# Patient Record
Sex: Male | Born: 1945 | Race: White | Hispanic: No | Smoking: Former smoker
Health system: Southern US, Community
[De-identification: ages and names within clinical notes are randomized; demographics above are authoritative.]

## PROBLEM LIST (undated history)

## (undated) DIAGNOSIS — D126 Benign neoplasm of colon, unspecified: Secondary | ICD-10-CM

## (undated) DIAGNOSIS — D6949 Other primary thrombocytopenia: Secondary | ICD-10-CM

## (undated) DIAGNOSIS — K219 Gastro-esophageal reflux disease without esophagitis: Secondary | ICD-10-CM

## (undated) DIAGNOSIS — K449 Diaphragmatic hernia without obstruction or gangrene: Secondary | ICD-10-CM

## (undated) DIAGNOSIS — K5790 Diverticulosis of intestine, part unspecified, without perforation or abscess without bleeding: Secondary | ICD-10-CM

## (undated) DIAGNOSIS — L409 Psoriasis, unspecified: Secondary | ICD-10-CM

## (undated) DIAGNOSIS — K429 Umbilical hernia without obstruction or gangrene: Secondary | ICD-10-CM

## (undated) DIAGNOSIS — M1990 Unspecified osteoarthritis, unspecified site: Secondary | ICD-10-CM

## (undated) DIAGNOSIS — I739 Peripheral vascular disease, unspecified: Secondary | ICD-10-CM

## (undated) DIAGNOSIS — N189 Chronic kidney disease, unspecified: Secondary | ICD-10-CM

## (undated) DIAGNOSIS — E119 Type 2 diabetes mellitus without complications: Secondary | ICD-10-CM

## (undated) DIAGNOSIS — Z8673 Personal history of transient ischemic attack (TIA), and cerebral infarction without residual deficits: Secondary | ICD-10-CM

## (undated) DIAGNOSIS — I471 Supraventricular tachycardia, unspecified: Secondary | ICD-10-CM

## (undated) DIAGNOSIS — R7303 Prediabetes: Secondary | ICD-10-CM

## (undated) DIAGNOSIS — Z87442 Personal history of urinary calculi: Secondary | ICD-10-CM

## (undated) DIAGNOSIS — Z8739 Personal history of other diseases of the musculoskeletal system and connective tissue: Secondary | ICD-10-CM

## (undated) DIAGNOSIS — I10 Essential (primary) hypertension: Secondary | ICD-10-CM

## (undated) DIAGNOSIS — I6523 Occlusion and stenosis of bilateral carotid arteries: Secondary | ICD-10-CM

## (undated) DIAGNOSIS — G459 Transient cerebral ischemic attack, unspecified: Secondary | ICD-10-CM

## (undated) DIAGNOSIS — E785 Hyperlipidemia, unspecified: Secondary | ICD-10-CM

## (undated) HISTORY — PX: UMBILICAL HERNIA REPAIR: SHX196

## (undated) HISTORY — PX: CHOLECYSTECTOMY: SHX55

## (undated) HISTORY — PX: TONSILLECTOMY: SUR1361

## (undated) HISTORY — PX: CATARACT EXTRACTION, BILATERAL: SHX1313

## (undated) HISTORY — PX: HERNIA REPAIR: SHX51

## (undated) HISTORY — PX: COLONOSCOPY: SHX174

---

## 2010-04-21 ENCOUNTER — Ambulatory Visit: Payer: Self-pay | Admitting: Gastroenterology

## 2014-07-16 DIAGNOSIS — I10 Essential (primary) hypertension: Secondary | ICD-10-CM | POA: Insufficient documentation

## 2014-07-16 DIAGNOSIS — Z8739 Personal history of other diseases of the musculoskeletal system and connective tissue: Secondary | ICD-10-CM | POA: Insufficient documentation

## 2014-11-25 DIAGNOSIS — I471 Supraventricular tachycardia, unspecified: Secondary | ICD-10-CM | POA: Insufficient documentation

## 2015-01-17 DIAGNOSIS — D6949 Other primary thrombocytopenia: Secondary | ICD-10-CM | POA: Insufficient documentation

## 2015-01-17 DIAGNOSIS — E1165 Type 2 diabetes mellitus with hyperglycemia: Secondary | ICD-10-CM | POA: Insufficient documentation

## 2015-01-17 DIAGNOSIS — R945 Abnormal results of liver function studies: Secondary | ICD-10-CM | POA: Insufficient documentation

## 2015-01-17 DIAGNOSIS — R7989 Other specified abnormal findings of blood chemistry: Secondary | ICD-10-CM | POA: Insufficient documentation

## 2015-01-17 DIAGNOSIS — IMO0002 Reserved for concepts with insufficient information to code with codable children: Secondary | ICD-10-CM | POA: Insufficient documentation

## 2016-06-15 ENCOUNTER — Encounter: Payer: Self-pay | Admitting: *Deleted

## 2016-06-18 ENCOUNTER — Ambulatory Visit
Admission: RE | Admit: 2016-06-18 | Discharge: 2016-06-18 | Disposition: A | Discharge status: Home / Self Care | Payer: Medicare Other | Source: Ambulatory Visit | Attending: Gastroenterology | Admitting: Gastroenterology

## 2016-06-18 ENCOUNTER — Ambulatory Visit: Payer: Medicare Other | Admitting: Anesthesiology

## 2016-06-18 ENCOUNTER — Encounter
Admission: RE | Disposition: A | Discharge status: Home / Self Care | Payer: Self-pay | Source: Ambulatory Visit | Attending: Gastroenterology

## 2016-06-18 DIAGNOSIS — K449 Diaphragmatic hernia without obstruction or gangrene: Secondary | ICD-10-CM | POA: Insufficient documentation

## 2016-06-18 DIAGNOSIS — D123 Benign neoplasm of transverse colon: Secondary | ICD-10-CM | POA: Diagnosis not present

## 2016-06-18 DIAGNOSIS — K2950 Unspecified chronic gastritis without bleeding: Secondary | ICD-10-CM | POA: Insufficient documentation

## 2016-06-18 DIAGNOSIS — Z79899 Other long term (current) drug therapy: Secondary | ICD-10-CM | POA: Diagnosis not present

## 2016-06-18 DIAGNOSIS — R195 Other fecal abnormalities: Secondary | ICD-10-CM | POA: Insufficient documentation

## 2016-06-18 DIAGNOSIS — K5730 Diverticulosis of large intestine without perforation or abscess without bleeding: Secondary | ICD-10-CM | POA: Diagnosis not present

## 2016-06-18 DIAGNOSIS — I10 Essential (primary) hypertension: Secondary | ICD-10-CM | POA: Insufficient documentation

## 2016-06-18 DIAGNOSIS — K210 Gastro-esophageal reflux disease with esophagitis: Secondary | ICD-10-CM | POA: Diagnosis not present

## 2016-06-18 DIAGNOSIS — K2980 Duodenitis without bleeding: Secondary | ICD-10-CM | POA: Insufficient documentation

## 2016-06-18 DIAGNOSIS — K259 Gastric ulcer, unspecified as acute or chronic, without hemorrhage or perforation: Secondary | ICD-10-CM | POA: Insufficient documentation

## 2016-06-18 DIAGNOSIS — M109 Gout, unspecified: Secondary | ICD-10-CM | POA: Diagnosis not present

## 2016-06-18 DIAGNOSIS — K2210 Ulcer of esophagus without bleeding: Secondary | ICD-10-CM | POA: Diagnosis not present

## 2016-06-18 HISTORY — DX: Diaphragmatic hernia without obstruction or gangrene: K44.9

## 2016-06-18 HISTORY — PX: COLONOSCOPY WITH PROPOFOL: SHX5780

## 2016-06-18 HISTORY — PX: ESOPHAGOGASTRODUODENOSCOPY (EGD) WITH PROPOFOL: SHX5813

## 2016-06-18 HISTORY — DX: Personal history of other diseases of the musculoskeletal system and connective tissue: Z87.39

## 2016-06-18 HISTORY — DX: Essential (primary) hypertension: I10

## 2016-06-18 LAB — CBC
HEMATOCRIT: 46.1 % (ref 40.0–52.0)
Hemoglobin: 15.8 g/dL (ref 13.0–18.0)
MCH: 35.4 pg — AB (ref 26.0–34.0)
MCHC: 34.2 g/dL (ref 32.0–36.0)
MCV: 103.5 fL — ABNORMAL HIGH (ref 80.0–100.0)
Platelets: 140 10*3/uL — ABNORMAL LOW (ref 150–440)
RBC: 4.46 MIL/uL (ref 4.40–5.90)
RDW: 13.9 % (ref 11.5–14.5)
WBC: 7 10*3/uL (ref 3.8–10.6)

## 2016-06-18 SURGERY — COLONOSCOPY WITH PROPOFOL
Anesthesia: General

## 2016-06-18 MED ORDER — SODIUM CHLORIDE 0.9 % IV SOLN
INTRAVENOUS | Status: DC
  Administered 2016-06-18: 1000 mL via INTRAVENOUS

## 2016-06-18 MED ORDER — PROPOFOL 500 MG/50ML IV EMUL
INTRAVENOUS | Status: DC | PRN
  Administered 2016-06-18: 150 ug/kg/min via INTRAVENOUS

## 2016-06-18 MED ORDER — MIDAZOLAM HCL 2 MG/2ML IJ SOLN
INTRAMUSCULAR | Status: DC | PRN
  Administered 2016-06-18: 2 mg via INTRAVENOUS

## 2016-06-18 MED ORDER — PHENYLEPHRINE HCL 10 MG/ML IJ SOLN
INTRAMUSCULAR | Status: DC | PRN
  Administered 2016-06-18 (×3): 100 ug via INTRAVENOUS

## 2016-06-18 MED ORDER — EPHEDRINE SULFATE 50 MG/ML IJ SOLN
INTRAMUSCULAR | Status: DC | PRN
  Administered 2016-06-18 (×2): 10 mg via INTRAVENOUS

## 2016-06-18 MED ORDER — SODIUM CHLORIDE 0.9 % IV SOLN
INTRAVENOUS | Status: DC
Start: 2016-06-18 — End: 2016-06-18

## 2016-06-18 MED ORDER — GLYCOPYRROLATE 0.2 MG/ML IJ SOLN
INTRAMUSCULAR | Status: DC | PRN
  Administered 2016-06-18: 0.2 mg via INTRAVENOUS

## 2016-06-18 MED ORDER — PROPOFOL 10 MG/ML IV BOLUS
INTRAVENOUS | Status: DC | PRN
  Administered 2016-06-18: 60 mg via INTRAVENOUS

## 2016-06-18 MED ORDER — LIDOCAINE HCL (CARDIAC) 20 MG/ML IV SOLN
INTRAVENOUS | Status: DC | PRN
  Administered 2016-06-18: 60 mg via INTRAVENOUS

## 2016-06-18 NOTE — Op Note (Signed)
[facility] Gastroenterology Patient Name: Victor Rojas Procedure Date: 06/18/2016 9:35 AM MRN: [id number] Account #: [id number] Date of Birth: 24-Dec-1945 Admit Type: Outpatient Age: 70 Room: [facility] ENDO ROOM 1 Gender: Male Note Status: Finalized Procedure:            Upper GI endoscopy Indications:          Heme positive stool Providers:            Lollie Sails, MD Referring MD:         Hewitt Blade. Sarina Ser, MD (Referring MD) Medicines:            Monitored Anesthesia Care Complications:        No immediate complications. Procedure:            Pre-Anesthesia Assessment:                       - ASA Grade Assessment: II - A patient with mild                        systemic disease.                       After obtaining informed consent, the endoscope was                        passed under direct vision. Throughout the procedure,                        the patient's blood pressure, pulse, and oxygen                        saturations were monitored continuously. The Endoscope                        was introduced through the mouth, and advanced to the                        third part of duodenum. The upper GI endoscopy was                        accomplished without difficulty. The patient tolerated                        the procedure well. Findings:      LA Grade B (one or more mucosal breaks greater than 5 mm, not extending       between the tops of two mucosal folds) esophagitis with no bleeding was       found.      A single 2 mm non-bleeding erosion was found in the mid esophagus.       Biopsies were taken with a cold forceps for histology.      The exam of the esophagus was otherwise normal.      Patchy mild inflammation characterized by adherent blood, congestion       (edema) and erosions was found in the gastric fundus, in the gastric       body and in the gastric antrum.      Four non-bleeding superficial gastric ulcers were found in the   prepyloric region of the stomach. Biopsies were taken with a cold       forceps  for histology. Biopsies were taken with a cold forceps for       Helicobacter pylori testing.      atypical antral/prepyloric foldPatchy moderate inflammation       characterized by erosions and shallow ulcerations was found in the       prepyloric region of the stomach. Biopsies were taken with a cold       forceps for histology.      The cardia and gastric fundus were normal on retroflexion otherwise.      Patchy mild inflammation characterized by congestion (edema) and       erythema was found in the duodenal bulb. Impression:           - LA Grade B erosive esophagitis.                       - A single non-bleeding erosion in the mid esophagus.                        Biopsied.                       - Erosive gastritis.                       - Non-bleeding gastric ulcers. Biopsied.                       - Erosive gastritis. Biopsied.                       - Duodenitis. Recommendation:       - Use Protonix (pantoprazole) 40 mg PO BID for 4 weeks.                       - Use Protonix (pantoprazole) 40 mg PO daily daily. Procedure Code(s):    --- Professional ---                       [phone], Esophagogastroduodenoscopy, flexible, transoral;                        with biopsy, single or multiple Diagnosis Code(s):    --- Professional ---                       K20.8, Other esophagitis                       K22.10, Ulcer of esophagus without bleeding                       K29.60, Other gastritis without bleeding                       K25.9, Gastric ulcer, unspecified as acute or chronic,                        without hemorrhage or perforation                       K29.80, Duodenitis without bleeding                       R19.5, Other fecal abnormalities CPT copyright 2016 American Medical Association.  All rights reserved. The codes documented in this report are preliminary and upon coder review may  be revised  to meet current compliance requirements. Lollie Sails, MD 06/18/2016 10:00:34 AM This report has been signed electronically. Number of Addenda: 0 Note Initiated On: 06/18/2016 9:35 AM      [facility]

## 2016-06-18 NOTE — H&P (Signed)
Outpatient short stay form Pre-procedure 06/18/2016 9:33 AM Lollie Sails MD  Primary Physician: Dr. Lisette Grinder  Reason for visit:  EGD and colonoscopy  History of present illness:  Patient is a 70 year old male presenting today as above. He had a screening Hemoccult cards done which was positive. He does have a history of some macrocytosis on his hemogram. Platelet count was rechecked today and found to be good at 140. He tolerated his prep well. He takes no recent aspirin products and he takes no blood thinning agents.    Current facility-administered medications:  .  0.9 %  sodium chloride infusion, , Intravenous, Continuous, Lollie Sails, MD, Last Rate: 20 mL/hr at 06/18/16 0905, 1,000 mL at 06/18/16 0905 .  0.9 %  sodium chloride infusion, , Intravenous, Continuous, Lollie Sails, MD  Prescriptions prior to admission  Medication Sig Dispense Refill Last Dose  . allopurinol (ZYLOPRIM) 300 MG tablet Take 300 mg by mouth daily.     . indomethacin (INDOCIN) 25 MG capsule Take 50 mg by mouth 3 (three) times daily with meals.     Marland Kitchen lisinopril-hydrochlorothiazide (PRINZIDE,ZESTORETIC) 20-12.5 MG tablet Take 1 tablet by mouth daily.   06/18/2016 at 0530  . metoprolol tartrate (LOPRESSOR) 25 MG tablet Take 25 mg by mouth 2 (two) times daily.   06/18/2016 at 0530     No Known Allergies   Past Medical History  Diagnosis Date  . Hiatal hernia   . History of gout   . Hypertension     Review of systems:      Physical Exam    Heart and lungs: Regular rate and rhythm without rub or gallop, lungs are bilaterally clear.    HEENT: Norm cephalic atraumatic eyes are anicteric    Other:     Pertinant exam for procedure: Soft nontender nondistended bowel sounds positive normoactive.    Planned proceedures: EGD, Colonoscopy and indicated procedures. I have discussed the risks benefits and complications of procedures to include not limited to bleeding, infection, perforation  and the risk of sedation and the patient wishes to proceed.    Lollie Sails, MD Gastroenterology 06/18/2016  9:33 AM

## 2016-06-18 NOTE — Anesthesia Preprocedure Evaluation (Signed)
Anesthesia Evaluation  Patient identified by MRN, date of birth, ID band Patient awake    Reviewed: Allergy & Precautions, H&P , NPO status , Patient's Chart, lab work & pertinent test results, reviewed documented beta blocker date and time   Airway Mallampati: II   Neck ROM: full    Dental  (+) Teeth Intact   Pulmonary neg pulmonary ROS, former smoker,    Pulmonary exam normal        Cardiovascular Exercise Tolerance: Good hypertension, On Medications negative cardio ROS Normal cardiovascular exam Rhythm:regular Rate:Normal     Neuro/Psych  Neuromuscular disease negative neurological ROS  negative psych ROS   GI/Hepatic negative GI ROS, Neg liver ROS, hiatal hernia,   Endo/Other  negative endocrine ROS  Renal/GU negative Renal ROS  negative genitourinary   Musculoskeletal   Abdominal   Peds  Hematology negative hematology ROS (+)   Anesthesia Other Findings Past Medical History:   Hiatal hernia                                                History of gout                                              Hypertension                                               Past Surgical History:   TONSILLECTOMY                                                 COLONOSCOPY                                                 BMI    Body Mass Index   29.96 kg/m 2     Reproductive/Obstetrics                             Anesthesia Physical Anesthesia Plan  ASA: II  Anesthesia Plan: General   Post-op Pain Management:    Induction:   Airway Management Planned:   Additional Equipment:   Intra-op Plan:   Post-operative Plan:   Informed Consent: I have reviewed the patients History and Physical, chart, labs and discussed the procedure including the risks, benefits and alternatives for the proposed anesthesia with the patient or authorized representative who has indicated his/her understanding and  acceptance.   Dental Advisory Given  Plan Discussed with: CRNA  Anesthesia Plan Comments:         Anesthesia Quick Evaluation

## 2016-06-18 NOTE — Op Note (Signed)
[facility] Gastroenterology Patient Name: Victor Rojas Procedure Date: 06/18/2016 9:34 AM MRN: [id number] Account #: [id number] Date of Birth: Mar 04, 1946 Admit Type: Outpatient Age: 70 Room: [facility] ENDO ROOM 1 Gender: Male Note Status: Finalized Procedure:            Colonoscopy Indications:          Heme positive stool Providers:            Lollie Sails, MD Referring MD:         Hewitt Blade. Sarina Ser, MD (Referring MD) Medicines:            Monitored Anesthesia Care Complications:        No immediate complications. Procedure:            Pre-Anesthesia Assessment:                       - ASA Grade Assessment: II - A patient with mild                        systemic disease.                       After obtaining informed consent, the colonoscope was                        passed under direct vision. Throughout the procedure,                        the patient's blood pressure, pulse, and oxygen                        saturations were monitored continuously. The Olympus                        PCF-H180AL colonoscope ( S#: [id number] ) was introduced                        through the anus and advanced to the the cecum,                        identified by appendiceal orifice and ileocecal valve.                        The colonoscopy was performed without difficulty. The                        patient tolerated the procedure well. The quality of                        the bowel preparation was good. Findings:      A 6 mm polyp was found in the splenic flexure. The polyp was sessile.       The polyp was removed with a cold snare. Resection and retrieval were       complete.      A few small-mouthed diverticula were found in the sigmoid colon and       distal descending colon.      The retroflexed view of the distal rectum and anal verge was normal and       showed no anal or rectal abnormalities.      The  digital rectal exam was normal. Pertinent negatives include  note       external skin tag. Impression:           - One 6 mm polyp at the splenic flexure, removed with a                        cold snare. Resected and retrieved.                       - Diverticulosis in the sigmoid colon and in the distal                        descending colon.                       - The distal rectum and anal verge are normal on                        retroflexion view. Recommendation:       - Discharge patient to home.                       - Return to GI clinic in 4 weeks. Procedure Code(s):    --- Professional ---                       [phone], Colonoscopy, flexible; with removal of tumor(s),                        polyp(s), or other lesion(s) by snare technique Diagnosis Code(s):    --- Professional ---                       D12.3, Benign neoplasm of transverse colon (hepatic                        flexure or splenic flexure)                       R19.5, Other fecal abnormalities                       K57.30, Diverticulosis of large intestine without                        perforation or abscess without bleeding CPT copyright 2016 American Medical Association. All rights reserved. The codes documented in this report are preliminary and upon coder review may  be revised to meet current compliance requirements. Lollie Sails, MD 06/18/2016 10:22:37 AM This report has been signed electronically. Number of Addenda: 0 Note Initiated On: 06/18/2016 9:34 AM Scope Withdrawal Time: 0 hours 11 minutes 5 seconds  Total Procedure Duration: 0 hours 18 minutes 8 seconds       [facility]

## 2016-06-18 NOTE — Transfer of Care (Signed)
Immediate Anesthesia Transfer of Care Note  Patient: Victor Rojas  Procedure(s) Performed: Procedure(s): COLONOSCOPY WITH PROPOFOL (N/A) ESOPHAGOGASTRODUODENOSCOPY (EGD) WITH PROPOFOL (N/A)  Patient Location: Endoscopy Unit  Anesthesia Type:General  Level of Consciousness: awake, alert , oriented and patient cooperative  Airway & Oxygen Therapy: Patient Spontanous Breathing and Patient connected to nasal cannula oxygen  Post-op Assessment: Report given to RN, Post -op Vital signs reviewed and stable and Patient moving all extremities X 4  Post vital signs: Reviewed and stable  Last Vitals:  Filed Vitals:   06/18/16 0755  BP: 135/67  Pulse: 62  Temp: 36.6 C  Resp: 18    Last Pain: There were no vitals filed for this visit.       Complications: No apparent anesthesia complications

## 2016-06-19 ENCOUNTER — Encounter: Payer: Self-pay | Admitting: Gastroenterology

## 2016-06-19 LAB — SURGICAL PATHOLOGY

## 2016-06-25 NOTE — Anesthesia Postprocedure Evaluation (Signed)
Anesthesia Post Note  Patient: Victor Rojas  Procedure(s) Performed: Procedure(s) (LRB): COLONOSCOPY WITH PROPOFOL (N/A) ESOPHAGOGASTRODUODENOSCOPY (EGD) WITH PROPOFOL (N/A)  Patient location during evaluation: PACU Anesthesia Type: General Level of consciousness: awake and alert Pain management: pain level controlled Vital Signs Assessment: post-procedure vital signs reviewed and stable Respiratory status: spontaneous breathing, nonlabored ventilation, respiratory function stable and patient connected to nasal cannula oxygen Cardiovascular status: blood pressure returned to baseline and stable Postop Assessment: no signs of nausea or vomiting Anesthetic complications: no    Last Vitals:  Filed Vitals:   06/18/16 1045 06/18/16 1055  BP: 99/64 117/74  Pulse: 75 79  Temp:    Resp: 15 16    Last Pain:  Filed Vitals:   06/19/16 0759  PainSc: 0-No pain                 Molli Barrows

## 2018-01-08 DIAGNOSIS — E782 Mixed hyperlipidemia: Secondary | ICD-10-CM | POA: Insufficient documentation

## 2018-04-15 DIAGNOSIS — Z8673 Personal history of transient ischemic attack (TIA), and cerebral infarction without residual deficits: Secondary | ICD-10-CM | POA: Insufficient documentation

## 2018-05-06 DIAGNOSIS — I6523 Occlusion and stenosis of bilateral carotid arteries: Secondary | ICD-10-CM | POA: Insufficient documentation

## 2018-05-21 ENCOUNTER — Ambulatory Visit (INDEPENDENT_AMBULATORY_CARE_PROVIDER_SITE_OTHER): Payer: Medicare Other | Admitting: Vascular Surgery

## 2018-05-21 ENCOUNTER — Encounter (INDEPENDENT_AMBULATORY_CARE_PROVIDER_SITE_OTHER): Payer: Self-pay | Admitting: Vascular Surgery

## 2018-05-21 VITALS — BP 156/81 | HR 54 | Resp 15 | Ht 68.5 in | Wt 204.0 lb

## 2018-05-21 DIAGNOSIS — I6523 Occlusion and stenosis of bilateral carotid arteries: Secondary | ICD-10-CM

## 2018-05-21 DIAGNOSIS — E785 Hyperlipidemia, unspecified: Secondary | ICD-10-CM

## 2018-05-21 DIAGNOSIS — I10 Essential (primary) hypertension: Secondary | ICD-10-CM | POA: Diagnosis not present

## 2018-05-21 NOTE — Progress Notes (Signed)
Subjective:    Patient ID: Victor Rojas, male    DOB: 1946-11-01, 72 y.o.   MRN: [id number] Chief Complaint  Patient presents with  . New Patient (Initial Visit)    Consult only, Carotid Stenosis   Presents as a new patient referred by Dr. Nehemiah Massed for evaluation of bilateral carotid artery stenosis.  Patient underwent a bilateral carotid ultrasound on Apr 29, 2018 and was found to have greater than 70% stenosis in the right internal carotid artery, less than 50% stenosis in the left internal carotid artery, with bilateral antegrade vertebral arteries. The patient denies experiencing Amaurosis Fugax, TIA like symptoms or focal motor deficits. The patient continues a regimen of ASA and dyslipidemia medication.  Patient denies any fever, nausea vomiting.  Review of Systems  Constitutional: Negative.   HENT: Negative.   Eyes: Negative.   Respiratory: Negative.   Cardiovascular: Negative.   Gastrointestinal: Negative.   Endocrine: Negative.   Genitourinary: Negative.   Musculoskeletal: Negative.   Skin: Negative.   Allergic/Immunologic: Negative.   Neurological: Negative.   Hematological: Negative.   Psychiatric/Behavioral: Negative.       Objective:   Physical Exam  Constitutional: He is oriented to person, place, and time. He appears well-developed and well-nourished. No distress.  HENT:  Head: Normocephalic and atraumatic.  Right Ear: External ear normal.  Left Ear: External ear normal.  Eyes: Pupils are equal, round, and reactive to light. Conjunctivae and EOM are normal.  Neck: Normal range of motion.  Right-sided bruit noted on exam.  No left-sided bruit noted.  Cardiovascular: Normal rate, regular rhythm, normal heart sounds and intact distal pulses.  Pulses:      Radial pulses are 2+ on the right side, and 2+ on the left side.  Pulmonary/Chest: Effort normal and breath sounds normal.  Musculoskeletal: Normal range of motion. He exhibits no edema.  Neurological: He  is alert and oriented to person, place, and time.  Skin: Skin is warm and dry. He is not diaphoretic.  Psychiatric: He has a normal mood and affect. His behavior is normal. Judgment and thought content normal.  Vitals reviewed.  BP (!) 156/81 (BP Location: Right Arm, Patient Position: Sitting)   Pulse (!) 54   Resp 15   Ht 5' 8.5" (1.74 m)   Wt 204 lb (92.5 kg)   BMI 30.57 kg/m   Past Medical History:  Diagnosis Date  . Hiatal hernia   . History of gout   . Hypertension    Social History   Socioeconomic History  . Marital status: Married    Spouse name: Not on file  . Number of children: Not on file  . Years of education: Not on file  . Highest education level: Not on file  Occupational History  . Not on file  Social Needs  . Financial resource strain: Not on file  . Food insecurity:    Worry: Not on file    Inability: Not on file  . Transportation needs:    Medical: Not on file    Non-medical: Not on file  Tobacco Use  . Smoking status: Former Research scientist (life sciences)  . Smokeless tobacco: Never Used  Substance and Sexual Activity  . Alcohol use: Yes    Alcohol/week: 3.6 oz    Types: 6 Cans of beer per week  . Drug use: No  . Sexual activity: Not on file  Lifestyle  . Physical activity:    Days per week: Not on file    Minutes per  session: Not on file  . Stress: Not on file  Relationships  . Social connections:    Talks on phone: Not on file    Gets together: Not on file    Attends religious service: Not on file    Active member of club or organization: Not on file    Attends meetings of clubs or organizations: Not on file    Relationship status: Not on file  . Intimate partner violence:    Fear of current or ex partner: Not on file    Emotionally abused: Not on file    Physically abused: Not on file    Forced sexual activity: Not on file  Other Topics Concern  . Not on file  Social History Narrative  . Not on file   Past Surgical History:  Procedure Laterality  Date  . COLONOSCOPY    . COLONOSCOPY WITH PROPOFOL N/A 06/18/2016   Procedure: COLONOSCOPY WITH PROPOFOL;  Surgeon: Lollie Sails, MD;  Location: [facility] ENDOSCOPY;  Service: Endoscopy;  Laterality: N/A;  . ESOPHAGOGASTRODUODENOSCOPY (EGD) WITH PROPOFOL N/A 06/18/2016   Procedure: ESOPHAGOGASTRODUODENOSCOPY (EGD) WITH PROPOFOL;  Surgeon: Lollie Sails, MD;  Location: [facility] ENDOSCOPY;  Service: Endoscopy;  Laterality: N/A;  . TONSILLECTOMY     History reviewed. No pertinent family history.  No Known Allergies     Assessment & Plan:  Presents as a new patient referred by Dr. Nehemiah Massed for evaluation of bilateral carotid artery stenosis.  Patient underwent a bilateral carotid ultrasound on Apr 29, 2018 and was found to have greater than 70% stenosis in the right internal carotid artery, less than 50% stenosis in the left internal carotid artery, with bilateral antegrade vertebral arteries. The patient denies experiencing Amaurosis Fugax, TIA like symptoms or focal motor deficits. The patient continues a regimen of ASA and dyslipidemia medication.  Patient denies any fever, nausea vomiting.  1. Bilateral carotid artery stenosis Studies reviewed with patient. Patient asymptomatic with stable duplex.   No intervention at this time.  Patient to return in six months for surveillance carotid duplex. Patient to continue medical optimization with ASA and dyslipidemia medication. Patient to remain abstinent of tobacco use. I have discussed with the patient at length the risk factors for and pathogenesis of atherosclerotic disease and encouraged a healthy diet, regular exercise regimen and blood pressure / glucose control.  Patient was instructed to contact our office in the interim with problems such as arm / leg weakness or numbness, speech / swallowing difficulty or temporary monocular blindness. The patient expresses their understanding.   - VAS US CAROTID; Future  2. Hyperlipidemia, unspecified  hyperlipidemia type - Stable On ASA and statin. Encouraged good control as its slows the progression of atherosclerotic disease  3. Essential hypertension - Stable Encouraged good control as its slows the progression of atherosclerotic disease  Current Outpatient Medications on File Prior to Visit  Medication Sig Dispense Refill  . allopurinol (ZYLOPRIM) 300 MG tablet Take 300 mg by mouth daily.    Marland Kitchen aspirin EC 81 MG tablet Take by mouth.    Marland Kitchen atorvastatin (LIPITOR) 20 MG tablet Take 20 mg by mouth daily.  11  . Blood Glucose Monitoring Suppl (ONE TOUCH ULTRA 2) w/Device KIT Use as instructed.    . indomethacin (INDOCIN) 25 MG capsule Take 50 mg by mouth 3 (three) times daily with meals.    Marland Kitchen lisinopril-hydrochlorothiazide (PRINZIDE,ZESTORETIC) 20-12.5 MG tablet Take 1 tablet by mouth daily.    . metoprolol tartrate (LOPRESSOR) 25 MG tablet  Take 25 mg by mouth 2 (two) times daily.    . Multiple Vitamin (MULTI-VITAMINS) TABS Take by mouth.     No current facility-administered medications on file prior to visit.    There are no Patient Instructions on file for this visit. Return in about 6 months (around 11/20/2018).  Denney Shein A Canio Winokur, PA-C

## 2018-08-08 DIAGNOSIS — L409 Psoriasis, unspecified: Secondary | ICD-10-CM | POA: Insufficient documentation

## 2018-08-08 DIAGNOSIS — K429 Umbilical hernia without obstruction or gangrene: Secondary | ICD-10-CM | POA: Insufficient documentation

## 2018-11-20 ENCOUNTER — Ambulatory Visit (INDEPENDENT_AMBULATORY_CARE_PROVIDER_SITE_OTHER): Payer: Medicare Other | Admitting: Nurse Practitioner

## 2018-11-20 ENCOUNTER — Ambulatory Visit (INDEPENDENT_AMBULATORY_CARE_PROVIDER_SITE_OTHER): Payer: Medicare Other

## 2018-11-20 ENCOUNTER — Encounter (INDEPENDENT_AMBULATORY_CARE_PROVIDER_SITE_OTHER): Payer: Self-pay | Admitting: Nurse Practitioner

## 2018-11-20 VITALS — BP 133/75 | HR 64 | Resp 16 | Ht 69.0 in | Wt 208.2 lb

## 2018-11-20 DIAGNOSIS — I10 Essential (primary) hypertension: Secondary | ICD-10-CM | POA: Diagnosis not present

## 2018-11-20 DIAGNOSIS — E785 Hyperlipidemia, unspecified: Secondary | ICD-10-CM

## 2018-11-20 DIAGNOSIS — I6523 Occlusion and stenosis of bilateral carotid arteries: Secondary | ICD-10-CM

## 2018-11-20 NOTE — Progress Notes (Signed)
Subjective:    Patient ID: Victor Rojas, male    DOB: [birth year], 72 y.o.   MRN: [id number] Chief Complaint  Patient presents with  . Follow-up    6 month    HPI  Victor Rojas is a 72 y.o. male The patient is seen for follow up evaluation of carotid stenosis. The carotid stenosis followed by ultrasound.   The patient denies amaurosis fugax. There is no recent history of TIA symptoms or focal motor deficits. There is no prior documented CVA.  The patient is taking enteric-coated aspirin 81 mg daily.  There is no history of migraine headaches. There is no history of seizures.  The patient has a history of coronary artery disease, no recent episodes of angina or shortness of breath. The patient denies PAD or claudication symptoms. There is a history of hyperlipidemia which is being treated with a statin.    Carotid Duplex done today shows 80-99% stenosis of right ICA and 1-39% stenosis of Left.  Last study is not available for review, but previous notes place the stenosis at >70%.    Past Medical History:  Diagnosis Date  . Hiatal hernia   . History of gout   . Hypertension     Past Surgical History:  Procedure Laterality Date  . COLONOSCOPY    . COLONOSCOPY WITH PROPOFOL N/A 06/18/2016   Procedure: COLONOSCOPY WITH PROPOFOL;  Surgeon: Lollie Sails, MD;  Location: [facility] ENDOSCOPY;  Service: Endoscopy;  Laterality: N/A;  . ESOPHAGOGASTRODUODENOSCOPY (EGD) WITH PROPOFOL N/A 06/18/2016   Procedure: ESOPHAGOGASTRODUODENOSCOPY (EGD) WITH PROPOFOL;  Surgeon: Lollie Sails, MD;  Location: [facility] ENDOSCOPY;  Service: Endoscopy;  Laterality: N/A;  . TONSILLECTOMY      Social History   Socioeconomic History  . Marital status: Married    Spouse name: Not on file  . Number of children: Not on file  . Years of education: Not on file  . Highest education level: Not on file  Occupational History  . Not on file  Social Needs  . Financial resource strain: Not on file    . Food insecurity:    Worry: Not on file    Inability: Not on file  . Transportation needs:    Medical: Not on file    Non-medical: Not on file  Tobacco Use  . Smoking status: Former Research scientist (life sciences)  . Smokeless tobacco: Never Used  Substance and Sexual Activity  . Alcohol use: Yes    Alcohol/week: 6.0 standard drinks    Types: 6 Cans of beer per week  . Drug use: No  . Sexual activity: Not on file  Lifestyle  . Physical activity:    Days per week: Not on file    Minutes per session: Not on file  . Stress: Not on file  Relationships  . Social connections:    Talks on phone: Not on file    Gets together: Not on file    Attends religious service: Not on file    Active member of club or organization: Not on file    Attends meetings of clubs or organizations: Not on file    Relationship status: Not on file  . Intimate partner violence:    Fear of current or ex partner: Not on file    Emotionally abused: Not on file    Physically abused: Not on file    Forced sexual activity: Not on file  Other Topics Concern  . Not on file  Social History Narrative  .  Not on file    History reviewed. No pertinent family history.  No Known Allergies   Review of Systems   Review of Systems: Negative Unless Checked Constitutional: '[]' Weight loss  '[]' Fever  '[]' Chills Cardiac: '[]' Chest pain   '[]'  Atrial Fibrillation  '[]' Palpitations   '[]' Shortness of breath when laying flat   '[]' Shortness of breath with exertion. Vascular:  '[]' Pain in legs with walking   '[]' Pain in legs with standing  '[]' History of DVT   '[]' Phlebitis   '[]' Swelling in legs   '[]' Varicose veins   '[]' Non-healing ulcers Pulmonary:   '[]' Uses home oxygen   '[]' Productive cough   '[]' Hemoptysis   '[]' Wheeze  '[]' COPD   '[]' Asthma Neurologic:  '[x]' Dizziness   '[]' Seizures   '[]' History of stroke   '[x]' History of TIA  '[]' Aphasia   '[x]' Vissual changes   '[]' Weakness or numbness in arm   '[]' Weakness or numbness in leg Musculoskeletal:   '[]' Joint swelling   '[]' Joint pain   '[]' Low  back pain  '[]'  History of Knee Replacement Hematologic:  '[]' Easy bruising  '[]' Easy bleeding   '[]' Hypercoagulable state   '[]' Anemic Gastrointestinal:  '[]' Diarrhea   '[]' Vomiting  '[]' Gastroesophageal reflux/heartburn   '[]' Difficulty swallowing. Genitourinary:  '[]' Chronic kidney disease   '[]' Difficult urination  '[]' Anuric   '[]' Blood in urine Skin:  '[]' Rashes   '[]' Ulcers  Psychological:  '[]' History of anxiety   '[]'  History of major depression  '[]'  Memory Difficulties     Objective:   Physical Exam  BP 133/75 (BP Location: Right Arm, Patient Position: Sitting)   Pulse 64   Resp 16   Ht '5\' 9"'  (1.753 m)   Wt 208 lb 3.2 oz (94.4 kg)   BMI 30.75 kg/m   Gen: WD/WN, NAD Head: [place]/AT, No temporalis wasting.  Ear/Nose/Throat: Hearing grossly intact, nares w/o erythema or drainage Eyes: PER, EOMI, sclera nonicteric.  Neck: Supple, no masses.  No JVD. Harsh R Bruit  Pulmonary:  Good air movement, no use of accessory muscles.  Cardiac: Regularly Irregular Rate  Vascular:  Vessel Right Left  Radial Palpable Palpable   Gastrointestinal: soft, non-distended. No guarding/no peritoneal signs.  Musculoskeletal: M/S 5/5 throughout.  No deformity or atrophy.  Neurologic: Pain and light touch intact in extremities.  Symmetrical.  Speech is fluent. Motor exam as listed above. Psychiatric: Judgment intact, Mood & affect appropriate for pt's clinical situation. Dermatologic: No Venous rashes. No Ulcers Noted.  No changes consistent with cellulitis. Lymph : No Cervical lymphadenopathy, no lichenification or skin changes of chronic lymphedema.      Assessment & Plan:   1. Bilateral carotid artery stenosis Carotid Duplex done today shows 80-99% stenosis of right ICA and 1-39% stenosis of Left.  Last study is not available for review, but previous notes place the stenosis at >70%.     The patient remains asymptomatic with respect to the carotid stenosis.  However, the patient has now progressed and has a lesion the is  >70%.  Patient should undergo CT angiography of the carotid arteries to define the degree of stenosis of the internal carotid arteries bilaterally and the anatomic suitability for surgery vs. intervention.  If the patient does indeed need surgery cardiac clearance will be required, once cleared the patient will be scheduled for surgery.  The risks, benefits and alternative therapies were reviewed in detail with the patient.  All questions were answered.  The patient agrees to proceed with imaging.  Continue antiplatelet therapy as prescribed. Continue management of CAD, HTN and Hyperlipidemia. Healthy heart diet, encouraged exercise at least 4  times per week.   - CT ANGIO NECK W OR WO CONTRAST; Future - CT ANGIO HEAD W OR WO CONTRAST; Future  2. Essential hypertension Continue antihypertensive medications as already ordered, these medications have been reviewed and there are no changes at this time.   3. Hyperlipidemia, unspecified hyperlipidemia type Continue statin as ordered and reviewed, no changes at this time    Current Outpatient Medications on File Prior to Visit  Medication Sig Dispense Refill  . allopurinol (ZYLOPRIM) 300 MG tablet Take 300 mg by mouth daily.    Marland Kitchen aspirin EC 81 MG tablet Take by mouth.    Marland Kitchen atorvastatin (LIPITOR) 20 MG tablet Take 20 mg by mouth daily.  11  . Blood Glucose Monitoring Suppl (ONE TOUCH ULTRA 2) w/Device KIT Use as instructed.    Marland Kitchen lisinopril-hydrochlorothiazide (PRINZIDE,ZESTORETIC) 20-12.5 MG tablet Take 1 tablet by mouth daily.    . metoprolol tartrate (LOPRESSOR) 25 MG tablet Take 25 mg by mouth 2 (two) times daily.    . Multiple Vitamin (MULTI-VITAMINS) TABS Take by mouth.     No current facility-administered medications on file prior to visit.     There are no Patient Instructions on file for this visit. No follow-ups on file.   Kris Hartmann, NP  This note was completed with Sales executive.  Any errors are purely  unintentional.

## 2018-11-25 ENCOUNTER — Ambulatory Visit
Admission: RE | Admit: 2018-11-25 | Discharge: 2018-11-25 | Disposition: A | Discharge status: Home / Self Care | Payer: Medicare Other | Source: Ambulatory Visit | Attending: Nurse Practitioner | Admitting: Nurse Practitioner

## 2018-11-25 DIAGNOSIS — I6523 Occlusion and stenosis of bilateral carotid arteries: Secondary | ICD-10-CM | POA: Diagnosis not present

## 2018-11-25 LAB — POCT I-STAT CREATININE: CREATININE: 1.1 mg/dL (ref 0.61–1.24)

## 2018-11-25 MED ORDER — IOPAMIDOL (ISOVUE-370) INJECTION 76%
75.0000 mL | Freq: Once | INTRAVENOUS | Status: AC | PRN
  Administered 2018-11-25: 75 mL via INTRAVENOUS

## 2018-11-27 ENCOUNTER — Ambulatory Visit (INDEPENDENT_AMBULATORY_CARE_PROVIDER_SITE_OTHER): Payer: Medicare Other | Admitting: Vascular Surgery

## 2018-11-27 ENCOUNTER — Encounter (INDEPENDENT_AMBULATORY_CARE_PROVIDER_SITE_OTHER): Payer: Self-pay | Admitting: Vascular Surgery

## 2018-11-27 VITALS — BP 154/74 | HR 66 | Resp 14 | Ht 69.0 in | Wt 211.2 lb

## 2018-11-27 DIAGNOSIS — Z7982 Long term (current) use of aspirin: Secondary | ICD-10-CM | POA: Diagnosis not present

## 2018-11-27 DIAGNOSIS — I10 Essential (primary) hypertension: Secondary | ICD-10-CM

## 2018-11-27 DIAGNOSIS — I6523 Occlusion and stenosis of bilateral carotid arteries: Secondary | ICD-10-CM | POA: Diagnosis not present

## 2018-11-27 DIAGNOSIS — E782 Mixed hyperlipidemia: Secondary | ICD-10-CM

## 2018-11-27 DIAGNOSIS — Z87891 Personal history of nicotine dependence: Secondary | ICD-10-CM

## 2018-11-27 NOTE — Progress Notes (Signed)
MRN : [id number]  Victor Rojas is a 72 y.o. (25-Dec-1945) male who presents with chief complaint of  Chief Complaint  Patient presents with  . Follow-up    CT RESULTS  .  History of Present Illness:   The patient is seen for follow up evaluation of carotid stenosis status post CT angiogram. CT scan was done 11/25/2018. Patient reports that the test went well with no problems or complications.   The patient denies interval amaurosis fugax. There is no recent or interval TIA symptoms or focal motor deficits. There is no prior documented CVA.  The patient is taking enteric-coated aspirin 81 mg daily.  There is no history of migraine headaches. There is no history of seizures.  The patient has a history of coronary artery disease, no recent episodes of angina or shortness of breath. The patient denies PAD or claudication symptoms. There is a history of hyperlipidemia which is being treated with a statin.    CT angiogram is reviewed by me personally and shows >95% RICA stenosis consistent with calcified plaque at the origin of the right internal carotid artery.   LICA <40%.  Current Meds  Medication Sig  . allopurinol (ZYLOPRIM) 300 MG tablet Take 300 mg by mouth daily.  Marland Kitchen aspirin EC 81 MG tablet Take by mouth.  Marland Kitchen atorvastatin (LIPITOR) 20 MG tablet Take 20 mg by mouth daily.  . Blood Glucose Monitoring Suppl (ONE TOUCH ULTRA 2) w/Device KIT Use as instructed.  Marland Kitchen lisinopril-hydrochlorothiazide (PRINZIDE,ZESTORETIC) 20-12.5 MG tablet Take 1 tablet by mouth daily.  . metoprolol tartrate (LOPRESSOR) 25 MG tablet Take 25 mg by mouth 2 (two) times daily.  . Multiple Vitamin (MULTI-VITAMINS) TABS Take by mouth.    Past Medical History:  Diagnosis Date  . Hiatal hernia   . History of gout   . Hypertension     Past Surgical History:  Procedure Laterality Date  . COLONOSCOPY    . COLONOSCOPY WITH PROPOFOL N/A 06/18/2016   Procedure: COLONOSCOPY WITH PROPOFOL;  Surgeon: Lollie Sails, MD;  Location: [facility] ENDOSCOPY;  Service: Endoscopy;  Laterality: N/A;  . ESOPHAGOGASTRODUODENOSCOPY (EGD) WITH PROPOFOL N/A 06/18/2016   Procedure: ESOPHAGOGASTRODUODENOSCOPY (EGD) WITH PROPOFOL;  Surgeon: Lollie Sails, MD;  Location: [facility] ENDOSCOPY;  Service: Endoscopy;  Laterality: N/A;  . TONSILLECTOMY      Social History Social History   Tobacco Use  . Smoking status: Former Research scientist (life sciences)  . Smokeless tobacco: Never Used  Substance Use Topics  . Alcohol use: Yes    Alcohol/week: 6.0 standard drinks    Types: 6 Cans of beer per week  . Drug use: No    Family History No family history on file.  No Known Allergies   REVIEW OF SYSTEMS (Negative unless checked)  Constitutional: '[]' Weight loss  '[]' Fever  '[]' Chills Cardiac: '[]' Chest pain   '[]' Chest pressure   '[]' Palpitations   '[]' Shortness of breath when laying flat   '[]' Shortness of breath with exertion. Vascular:  '[]' Pain in legs with walking   '[]' Pain in legs at rest  '[]' History of DVT   '[]' Phlebitis   '[]' Swelling in legs   '[]' Varicose veins   '[]' Non-healing ulcers Pulmonary:   '[]' Uses home oxygen   '[]' Productive cough   '[]' Hemoptysis   '[]' Wheeze  '[]' COPD   '[]' Asthma Neurologic:  '[]' Dizziness   '[]' Seizures   '[]' History of stroke   '[]' History of TIA  '[]' Aphasia   '[x]' Vissual changes   '[]' Weakness or numbness in arm   '[]' Weakness or numbness in leg Musculoskeletal:   '[]'   Joint swelling   '[]' Joint pain   '[]' Low back pain Hematologic:  '[]' Easy bruising  '[]' Easy bleeding   '[]' Hypercoagulable state   '[]' Anemic Gastrointestinal:  '[]' Diarrhea   '[]' Vomiting  '[]' Gastroesophageal reflux/heartburn   '[]' Difficulty swallowing. Genitourinary:  '[]' Chronic kidney disease   '[]' Difficult urination  '[]' Frequent urination   '[]' Blood in urine Skin:  '[]' Rashes   '[]' Ulcers  Psychological:  '[]' History of anxiety   '[]'  History of major depression.  Physical Examination  Vitals:   11/27/18 1600  BP: (!) 154/74  Pulse: 66  Resp: 14  Weight: 211 lb 3.2 oz (95.8 kg)  Height: '5\' 9"'  (1.753 m)    Body mass index is 31.19 kg/m. Gen: WD/WN, NAD Head: [place]/AT, No temporalis wasting.  Ear/Nose/Throat: Hearing grossly intact, nares w/o erythema or drainage Eyes: PER, EOMI, sclera nonicteric.  Neck: Supple, no large masses.   Pulmonary:  Good air movement, no audible wheezing bilaterally, no use of accessory muscles.  Cardiac: RRR, no JVD Vascular: right carotid bruit Vessel Right Left  Radial Palpable Palpable  Brachial Palpable Palpable  Carotid Palpable Palpable  Gastrointestinal: Non-distended. No guarding/no peritoneal signs.  Musculoskeletal: M/S 5/5 throughout.  No deformity or atrophy.  Neurologic: CN 2-12 intact. Symmetrical.  Speech is fluent. Motor exam as listed above. Psychiatric: Judgment intact, Mood & affect appropriate for pt's clinical situation. Dermatologic: No rashes or ulcers noted.  No changes consistent with cellulitis. Lymph : No lichenification or skin changes of chronic lymphedema.  CBC Lab Results  Component Value Date   WBC 7.0 06/18/2016   HGB 15.8 06/18/2016   HCT 46.1 06/18/2016   MCV 103.5 (H) 06/18/2016   PLT 140 (L) 06/18/2016    BMET    Component Value Date/Time   CREATININE 1.10 11/25/2018 1303   Estimated Creatinine Clearance: 69.3 mL/min (by C-G formula based on SCr of 1.1 mg/dL).  COAG No results found for: INR, PROTIME  Radiology Ct Angio Head W Or Wo Contrast  Addendum Date: 11/25/2018   ADDENDUM REPORT: 11/25/2018 14:30 ADDENDUM: Study discussed by telephone with Dr. Eulogio Ditch on 11/25/2018 at 1416 hours. Electronically Signed   By: Genevie Ann M.D.   On: 11/25/2018 14:30   Result Date: 11/25/2018 CLINICAL DATA:  72 year old male with high-grade right carotid stenosis by ultrasound earlier this month. EXAM: CT ANGIOGRAPHY HEAD AND NECK TECHNIQUE: Multidetector CT imaging of the head and neck was performed using the standard protocol during bolus administration of intravenous contrast. Multiplanar CT image reconstructions  and MIPs were obtained to evaluate the vascular anatomy. Carotid stenosis measurements (when applicable) are obtained utilizing NASCET criteria, using the distal internal carotid diameter as the denominator. CONTRAST:  38m ISOVUE-370 IOPAMIDOL (ISOVUE-370) INJECTION 76% COMPARISON:  None. FINDINGS: CT HEAD Brain: Cerebral volume is within normal limits for age. No midline shift, ventriculomegaly, mass effect, evidence of mass lesion, intracranial hemorrhage or evidence of cortically based acute infarction. Gray-white matter differentiation is within normal limits throughout the brain. No cortical encephalomalacia identified. Calvarium and skull base: Negative. Paranasal sinuses: Visible paranasal sinuses and mastoids are well pneumatized. Orbits: Negative orbits soft tissues. Visualized scalp soft tissues are within normal limits. CTA NECK Skeleton: Largely absent dentition. Degenerative disc and endplate changes in the lower cervical spine. No acute osseous abnormality identified. Upper chest: Paraseptal emphysema. No superior mediastinal lymphadenopathy. Other neck: Negative; no neck mass or lymphadenopathy. Aortic arch: 3 vessel arch configuration. Extensive Calcified aortic atherosclerosis. Right carotid system: No brachiocephalic artery or right CCA origin stenosis despite plaque. Circumferential soft plaque in  the right CCA. At the bifurcation bulky soft and calcified plaque results in radiographic string sign stenosis over a short 3-4 millimeters segment (series 20, image 139). Bulky plaque continues to the distal bulb. The left ECA origin is also affected with high-grade stenosis. The right ICA remains patent but has a mildly diminished caliber. Left carotid system: No left CCA origin stenosis despite plaque. Soft and calcified plaque in the medial left CCA proximal to the bifurcation. Calcified plaque at the left ICA origin and bulb with no stenosis. Vertebral arteries: No proximal right subclavian artery  stenosis despite plaque. The right vertebral artery is occluded with bulky calcified plaque from its origin through most of the V1 segment (series 18, image 124). There is additional right V2 segment calcified plaque. The right vertebral is reconstituted from muscular branches at C3 and patent to the skull base. Less than 50 % stenosis with respect to the distal vessel of the proximal left subclavian artery due to soft and calcified plaque. Bulky calcified plaque at the left vertebral artery origin resulting in String sign stenosis. The left V1 segment is patent but highly diminutive. The vessel has a more normal caliber beginning at C3-C4 where muscular branches are noted. The left vertebral remains patent to the skull base. CTA HEAD Posterior circulation: Heavily calcified right Vertebral artery V4 segment with stenosis and asymmetrically decreased enhancement. The vessel does remain patent to the vertebrobasilar junction. Comparatively mild calcified plaque in the left V4 segment with mild stenosis. Normal left PICA origin. Patent vertebrobasilar junction appearing mostly supplied from the left. Irregular but patent basilar artery without stenosis. Patent SCA origins. Normal left PCA origin. Fetal type right PCA origin. Left posterior communicating artery is diminutive. Bilateral PCA branches are within normal limits. Anterior circulation:  Both ICA siphons are patent. On the right there is heavy siphon calcification with severe cavernous stenosis (series 17, image 233 and series 18, image 127), and up to moderate superimposed supraclinoid segment stenosis. Normal right posterior communicating artery origin. On the left there is moderate cavernous and supraclinoid segment stenosis. There is a small infundibulum suspected corresponding to a diminutive left posterior communicating artery (series 17, image 239 and series 20, image 174). Both carotid termini are patent. The left ACA A1 segment is dominant, the right  is diminutive or absent. The anterior communicating artery is normal. There are prominent bilateral frontal polar branches. The bilateral ACA branches are within normal limits. Left MCA M1 segment and bifurcation are within normal limits. Left MCA branches are within normal limits. Right MCA M1 segment is mildly irregular without significant stenosis. Right MCA trifurcation and right MCA branches are within normal limits. Venous sinuses: Patent on the delayed images. Anatomic variants: Fetal type right PCA origin. Dominant left and diminutive or absent right ACA A1 segments. Delayed phase: No abnormal enhancement identified. Review of the MIP images confirms the above findings IMPRESSION: 1. Radiographic-String-Sign Stenosis at the Right ICA origin due to bulky soft and calcified plaque. Right ICA remains patent but has a mildly diminished caliber. Associated high-grade right ECA origin stenosis. Downstream superimposed Severe Right ICA cavernous segment stenosis due to bulky calcified plaque. 2. Positive also for occluded Right Vertebral Artery origin. The vessel is reconstituted at the distal V2 segment, but heavily calcified and stenotic again in the right V4 segment. 3. Positive also for severe Left Vertebral Artery origin stenosis due to bulky calcified plaque. Improved left vertebral artery flow also in the distal V2 segment due to muscular branches. 4.  Left carotid bifurcation plaque without stenosis. Up to moderate left ICA siphon stenosis due to bulky calcified plaque. 5. Otherwise negative intracranial anterior and posterior circulation. 6. Normal for age CT appearance of the brain. 7. Aortic Atherosclerosis (ICD10-I70.0) and Emphysema (ICD10-J43.9). Electronically Signed: By: Genevie Ann M.D. On: 11/25/2018 14:04   Ct Angio Neck W Or Wo Contrast  Addendum Date: 11/25/2018   ADDENDUM REPORT: 11/25/2018 14:30 ADDENDUM: Study discussed by telephone with Dr. Eulogio Ditch on 11/25/2018 at 1416 hours.  Electronically Signed   By: Genevie Ann M.D.   On: 11/25/2018 14:30   Result Date: 11/25/2018 CLINICAL DATA:  72 year old male with high-grade right carotid stenosis by ultrasound earlier this month. EXAM: CT ANGIOGRAPHY HEAD AND NECK TECHNIQUE: Multidetector CT imaging of the head and neck was performed using the standard protocol during bolus administration of intravenous contrast. Multiplanar CT image reconstructions and MIPs were obtained to evaluate the vascular anatomy. Carotid stenosis measurements (when applicable) are obtained utilizing NASCET criteria, using the distal internal carotid diameter as the denominator. CONTRAST:  44m ISOVUE-370 IOPAMIDOL (ISOVUE-370) INJECTION 76% COMPARISON:  None. FINDINGS: CT HEAD Brain: Cerebral volume is within normal limits for age. No midline shift, ventriculomegaly, mass effect, evidence of mass lesion, intracranial hemorrhage or evidence of cortically based acute infarction. Gray-white matter differentiation is within normal limits throughout the brain. No cortical encephalomalacia identified. Calvarium and skull base: Negative. Paranasal sinuses: Visible paranasal sinuses and mastoids are well pneumatized. Orbits: Negative orbits soft tissues. Visualized scalp soft tissues are within normal limits. CTA NECK Skeleton: Largely absent dentition. Degenerative disc and endplate changes in the lower cervical spine. No acute osseous abnormality identified. Upper chest: Paraseptal emphysema. No superior mediastinal lymphadenopathy. Other neck: Negative; no neck mass or lymphadenopathy. Aortic arch: 3 vessel arch configuration. Extensive Calcified aortic atherosclerosis. Right carotid system: No brachiocephalic artery or right CCA origin stenosis despite plaque. Circumferential soft plaque in the right CCA. At the bifurcation bulky soft and calcified plaque results in radiographic string sign stenosis over a short 3-4 millimeters segment (series 20, image 139). Bulky plaque  continues to the distal bulb. The left ECA origin is also affected with high-grade stenosis. The right ICA remains patent but has a mildly diminished caliber. Left carotid system: No left CCA origin stenosis despite plaque. Soft and calcified plaque in the medial left CCA proximal to the bifurcation. Calcified plaque at the left ICA origin and bulb with no stenosis. Vertebral arteries: No proximal right subclavian artery stenosis despite plaque. The right vertebral artery is occluded with bulky calcified plaque from its origin through most of the V1 segment (series 18, image 124). There is additional right V2 segment calcified plaque. The right vertebral is reconstituted from muscular branches at C3 and patent to the skull base. Less than 50 % stenosis with respect to the distal vessel of the proximal left subclavian artery due to soft and calcified plaque. Bulky calcified plaque at the left vertebral artery origin resulting in String sign stenosis. The left V1 segment is patent but highly diminutive. The vessel has a more normal caliber beginning at C3-C4 where muscular branches are noted. The left vertebral remains patent to the skull base. CTA HEAD Posterior circulation: Heavily calcified right Vertebral artery V4 segment with stenosis and asymmetrically decreased enhancement. The vessel does remain patent to the vertebrobasilar junction. Comparatively mild calcified plaque in the left V4 segment with mild stenosis. Normal left PICA origin. Patent vertebrobasilar junction appearing mostly supplied from the left. Irregular but patent basilar  artery without stenosis. Patent SCA origins. Normal left PCA origin. Fetal type right PCA origin. Left posterior communicating artery is diminutive. Bilateral PCA branches are within normal limits. Anterior circulation:  Both ICA siphons are patent. On the right there is heavy siphon calcification with severe cavernous stenosis (series 17, image 233 and series 18, image 127),  and up to moderate superimposed supraclinoid segment stenosis. Normal right posterior communicating artery origin. On the left there is moderate cavernous and supraclinoid segment stenosis. There is a small infundibulum suspected corresponding to a diminutive left posterior communicating artery (series 17, image 239 and series 20, image 174). Both carotid termini are patent. The left ACA A1 segment is dominant, the right is diminutive or absent. The anterior communicating artery is normal. There are prominent bilateral frontal polar branches. The bilateral ACA branches are within normal limits. Left MCA M1 segment and bifurcation are within normal limits. Left MCA branches are within normal limits. Right MCA M1 segment is mildly irregular without significant stenosis. Right MCA trifurcation and right MCA branches are within normal limits. Venous sinuses: Patent on the delayed images. Anatomic variants: Fetal type right PCA origin. Dominant left and diminutive or absent right ACA A1 segments. Delayed phase: No abnormal enhancement identified. Review of the MIP images confirms the above findings IMPRESSION: 1. Radiographic-String-Sign Stenosis at the Right ICA origin due to bulky soft and calcified plaque. Right ICA remains patent but has a mildly diminished caliber. Associated high-grade right ECA origin stenosis. Downstream superimposed Severe Right ICA cavernous segment stenosis due to bulky calcified plaque. 2. Positive also for occluded Right Vertebral Artery origin. The vessel is reconstituted at the distal V2 segment, but heavily calcified and stenotic again in the right V4 segment. 3. Positive also for severe Left Vertebral Artery origin stenosis due to bulky calcified plaque. Improved left vertebral artery flow also in the distal V2 segment due to muscular branches. 4. Left carotid bifurcation plaque without stenosis. Up to moderate left ICA siphon stenosis due to bulky calcified plaque. 5. Otherwise negative  intracranial anterior and posterior circulation. 6. Normal for age CT appearance of the brain. 7. Aortic Atherosclerosis (ICD10-I70.0) and Emphysema (ICD10-J43.9). Electronically Signed: By: Genevie Ann M.D. On: 11/25/2018 14:04   Vas US Carotid  Result Date: 11/21/2018 Carotid Arterial Duplex Study Indications: Carotid artery disease. Performing Technologist: Almira Coaster RVS  Examination Guidelines: A complete evaluation includes B-mode imaging, spectral Doppler, color Doppler, and power Doppler as needed of all accessible portions of each vessel. Bilateral testing is considered an integral part of a complete examination. Limited examinations for reoccurring indications may be performed as noted.  Right Carotid Findings: +----------+-------+-------+--------+---------------------------------+--------+           PSV    EDV    StenosisDescribe                         Comments           cm/s   cm/s                                                     +----------+-------+-------+--------+---------------------------------+--------+ CCA Prox  40     10                                                       +----------+-------+-------+--------+---------------------------------+--------+  CCA Mid   29     10                                                       +----------+-------+-------+--------+---------------------------------+--------+ CCA Distal33     13                                                       +----------+-------+-------+--------+---------------------------------+--------+ ICA Prox  497    132    80-99%  heterogenous, irregular and                                               calcific                                  +----------+-------+-------+--------+---------------------------------+--------+ ICA Mid   467    83                                                        +----------+-------+-------+--------+---------------------------------+--------+ ICA Distal138    29                                                       +----------+-------+-------+--------+---------------------------------+--------+ ECA       242    35                                                       +----------+-------+-------+--------+---------------------------------+--------+ +----------+--------+-------+--------+-------------------+           PSV cm/sEDV cmsDescribeArm Pressure (mmHG) +----------+--------+-------+--------+-------------------+ [id number]      0                                  +----------+--------+-------+--------+-------------------+ +---------+--------+--+--------+-+ VertebralPSV cm/s24EDV cm/s7 +---------+--------+--+--------+-+  Left Carotid Findings: +----------+--------+--------+--------+--------+--------+           PSV cm/sEDV cm/sStenosisDescribeComments +----------+--------+--------+--------+--------+--------+ CCA Prox  136     33                               +----------+--------+--------+--------+--------+--------+ CCA Mid   121     29                               +----------+--------+--------+--------+--------+--------+ CCA Distal61      14                               +----------+--------+--------+--------+--------+--------+  ICA Prox  77      18      1-39%                    +----------+--------+--------+--------+--------+--------+ ICA Mid   64      21                               +----------+--------+--------+--------+--------+--------+ ICA Distal72      27                               +----------+--------+--------+--------+--------+--------+ ECA       97      17                               +----------+--------+--------+--------+--------+--------+ +----------+--------+--------+--------+-------------------+ SubclavianPSV cm/sEDV cm/sDescribeArm Pressure (mmHG)  +----------+--------+--------+--------+-------------------+           181     0                                   +----------+--------+--------+--------+-------------------+ +---------+--------+---+--------+--+ VertebralPSV cm/s132EDV cm/s40 +---------+--------+---+--------+--+  Summary: Right Carotid: Velocities in the right ICA are consistent with a 80-99%                stenosis. The ECA appears >50% stenosed. Left Carotid: Velocities in the left ICA are consistent with a 1-39% stenosis. Vertebrals:  Bilateral vertebral arteries demonstrate antegrade flow. Subclavians: Normal flow hemodynamics were seen in bilateral subclavian              arteries. *See table(s) above for measurements and observations.  Electronically signed by Leotis Pain MD on 11/21/2018 at 1:34:46 PM.    Final      Assessment/Plan 1. Bilateral carotid artery stenosis Recommend:  The patient remains asymptomatic with respect to the carotid stenosis.  However, the patient has now progressed and has a lesion the is >95%.  Patient's CT angiography of the carotid arteries confirms >95% right ICA stenosis.  The anatomical considerations support surgery over stenting.  This was discussed in detail with the patient.  The patient does indeed need surgery, therefore, cardiac clearance will be arranged. Once cleared the patient will be scheduled for surgery.  The risks, benefits and alternative therapies were reviewed in detail with the patient.  All questions were answered.  The patient agrees to proceed with surgery of the right carotid artery.  Continue antiplatelet therapy as prescribed. Continue management of CAD, HTN and Hyperlipidemia. Healthy heart diet, encouraged exercise at least 4 times per week.    A total of 30 minutes was spent with this patient and greater than 50% was spent in counseling and coordination of care with the patient.  Discussion included the treatment options for vascular disease including  indications for surgery and intervention.  Also discussed is the appropriate timing of treatment.  In addition medical therapy was discussed.   2. Essential hypertension Continue antihypertensive medications as already ordered, these medications have been reviewed and there are no changes at this time.   3. Mixed hyperlipidemia Continue statin as ordered and reviewed, no changes at this time     [name], MD  11/27/2018 4:53 PM

## 2018-12-08 ENCOUNTER — Telehealth (INDEPENDENT_AMBULATORY_CARE_PROVIDER_SITE_OTHER): Payer: Self-pay

## 2018-12-08 ENCOUNTER — Encounter (INDEPENDENT_AMBULATORY_CARE_PROVIDER_SITE_OTHER): Payer: Self-pay

## 2018-12-08 NOTE — Telephone Encounter (Signed)
Spoke with the patient and we discussed him having his surgery on 12/19/18 with Dr. Delana Meyer. I let the patient know that I will schedule him for a pre-op and call back. Per the patient it is okay to leave a message with the information. A message was left regarding his surgery day as well as his pre-op day and time. I also left a message stating that all of this information will be mailed out to the patient.

## 2018-12-15 ENCOUNTER — Other Ambulatory Visit: Payer: Self-pay

## 2018-12-15 ENCOUNTER — Encounter
Admission: RE | Admit: 2018-12-15 | Discharge: 2018-12-15 | Disposition: A | Discharge status: Home / Self Care | Payer: Medicare Other | Source: Ambulatory Visit | Attending: Vascular Surgery | Admitting: Vascular Surgery

## 2018-12-15 ENCOUNTER — Other Ambulatory Visit (INDEPENDENT_AMBULATORY_CARE_PROVIDER_SITE_OTHER): Payer: Self-pay | Admitting: Nurse Practitioner

## 2018-12-15 DIAGNOSIS — I6529 Occlusion and stenosis of unspecified carotid artery: Secondary | ICD-10-CM | POA: Diagnosis not present

## 2018-12-15 DIAGNOSIS — Z01818 Encounter for other preprocedural examination: Secondary | ICD-10-CM | POA: Diagnosis not present

## 2018-12-15 HISTORY — DX: Personal history of urinary calculi: Z87.442

## 2018-12-15 HISTORY — DX: Unspecified osteoarthritis, unspecified site: M19.90

## 2018-12-15 HISTORY — DX: Peripheral vascular disease, unspecified: I73.9

## 2018-12-15 HISTORY — DX: Prediabetes: R73.03

## 2018-12-15 LAB — CBC WITH DIFFERENTIAL/PLATELET
Abs Immature Granulocytes: 0.04 10*3/uL (ref 0.00–0.07)
Basophils Absolute: 0 10*3/uL (ref 0.0–0.1)
Basophils Relative: 1 %
EOS ABS: 0.1 10*3/uL (ref 0.0–0.5)
Eosinophils Relative: 2 %
HCT: 45.3 % (ref 39.0–52.0)
Hemoglobin: 15.4 g/dL (ref 13.0–17.0)
Immature Granulocytes: 1 %
Lymphocytes Relative: 24 %
Lymphs Abs: 1.5 10*3/uL (ref 0.7–4.0)
MCH: 35 pg — ABNORMAL HIGH (ref 26.0–34.0)
MCHC: 34 g/dL (ref 30.0–36.0)
MCV: 103 fL — ABNORMAL HIGH (ref 80.0–100.0)
Monocytes Absolute: 0.9 10*3/uL (ref 0.1–1.0)
Monocytes Relative: 14 %
NRBC: 0 % (ref 0.0–0.2)
Neutro Abs: 3.7 10*3/uL (ref 1.7–7.7)
Neutrophils Relative %: 58 %
Platelets: 124 10*3/uL — ABNORMAL LOW (ref 150–400)
RBC: 4.4 MIL/uL (ref 4.22–5.81)
RDW: 12.7 % (ref 11.5–15.5)
WBC: 6.3 10*3/uL (ref 4.0–10.5)

## 2018-12-15 LAB — BASIC METABOLIC PANEL
Anion gap: 10 (ref 5–15)
BUN: 34 mg/dL — ABNORMAL HIGH (ref 8–23)
CO2: 27 mmol/L (ref 22–32)
Calcium: 9.1 mg/dL (ref 8.9–10.3)
Chloride: 102 mmol/L (ref 98–111)
Creatinine, Ser: 1.28 mg/dL — ABNORMAL HIGH (ref 0.61–1.24)
GFR calc non Af Amer: 56 mL/min — ABNORMAL LOW (ref >60)
Glucose, Bld: 117 mg/dL — ABNORMAL HIGH (ref 70–99)
Potassium: 4.1 mmol/L (ref 3.5–5.1)
Sodium: 139 mmol/L (ref 135–145)

## 2018-12-15 LAB — TYPE AND SCREEN
ABO/RH(D): A POS
Antibody Screen: NEGATIVE

## 2018-12-15 LAB — PROTIME-INR
INR: 1.08
Prothrombin Time: 13.9 seconds (ref 11.4–15.2)

## 2018-12-15 LAB — APTT: aPTT: 29 seconds (ref 24–36)

## 2018-12-15 NOTE — Patient Instructions (Signed)
Your procedure is scheduled on: Friday 12/19/2018 Report to [facility]. To find out your arrival time please call [phone] between 1PM - 3PM on Thursday 12/18/2018.  Remember: Instructions that are not followed completely may result in serious medical risk, up to and including death, or upon the discretion of your surgeon and anesthesiologist your surgery may need to be rescheduled.     _X__ 1. Do not eat food after midnight the night before your procedure.                 No gum chewing or hard candies. You may drink clear liquids up to 2 hours                 before you are scheduled to arrive for your surgery- DO not drink clear                 liquids within 2 hours of the start of your surgery.                 Clear Liquids include:  water, apple juice without pulp, clear carbohydrate                 drink such as Clearfast or Gatorade, Black Coffee or Tea (Do not add                 anything to coffee or tea).  __X__2.  On the morning of surgery brush your teeth with toothpaste and water, you                 may rinse your mouth with mouthwash if you wish.  Do not swallow any              toothpaste of mouthwash.     _X__ 3.  No Alcohol for 24 hours before or after surgery.   _X__ 4.  Do Not Smoke or use e-cigarettes For 24 Hours Prior to Your Surgery.                 Do not use any chewable tobacco products for at least 6 hours prior to                 surgery.  ____  5.  Bring all medications with you on the day of surgery if instructed.   __X__  6.  Notify your doctor if there is any change in your medical condition      (cold, fever, infections).     Do not wear jewelry, make-up, hairpins, clips or nail polish. Do not wear lotions, powders, or perfumes.  Do not shave 48 hours prior to surgery. Men may shave face and neck. Do not bring valuables to the hospital.    [facility] is not responsible for any belongings or  valuables.  Contacts, dentures/partials or body piercings may not be worn into surgery. Bring a case for your contacts, glasses or hearing aids, a denture cup will be supplied. Leave your suitcase in the car. After surgery it may be brought to your room. For patients admitted to the hospital, discharge time is determined by your treatment team.   Patients discharged the day of surgery will not be allowed to drive home.   Please read over the following fact sheets that you were given:   MRSA Information  __X__ Take these medicines the morning of surgery with A SIP OF WATER:  1. allopurinol (ZYLOPRIM)   2. atorvastatin (LIPITOR)  3. metoprolol tartrate (LOPRESSOR)   4.  5.  6.  ____ Fleet Enema (as directed)   __X__ Use CHG Soap/SAGE wipes as directed  ____ Use inhalers on the day of surgery  ____ Stop metformin/Janumet/Farxiga 2 days prior to surgery    ____ Take 1/2 of usual insulin dose the night before surgery. No insulin the morning          of surgery.   ____ Stop Blood Thinners Coumadin/Plavix/Xarelto/Pleta/Pradaxa/Eliquis/Effient/Aspirin  on   Or contact your Surgeon, Cardiologist or Medical Doctor regarding  ability to stop your blood thinners  __X__ Stop Anti-inflammatories 7 days before surgery such as Advil, Ibuprofen, Motrin,  BC or Goodies Powder, Naprosyn, Naproxen, Aleve     __X__ Stop all herbal supplements, fish oil or vitamin E until after surgery.    ____ Bring C-Pap to the hospital.

## 2018-12-18 MED ORDER — CEFAZOLIN SODIUM-DEXTROSE 2-4 GM/100ML-% IV SOLN
2.0000 g | INTRAVENOUS | Status: AC
  Administered 2018-12-19: 2 g via INTRAVENOUS

## 2018-12-19 ENCOUNTER — Inpatient Hospital Stay: Payer: Medicare Other | Admitting: Anesthesiology

## 2018-12-19 ENCOUNTER — Inpatient Hospital Stay
Admission: RE | Admit: 2018-12-19 | Discharge: 2018-12-20 | DRG: 039 | Disposition: A | Discharge status: Home / Self Care | Payer: Medicare Other | Attending: Vascular Surgery | Admitting: Vascular Surgery

## 2018-12-19 ENCOUNTER — Other Ambulatory Visit: Payer: Self-pay

## 2018-12-19 ENCOUNTER — Encounter
Admission: RE | Disposition: A | Discharge status: Home / Self Care | Payer: Self-pay | Source: Home / Self Care | Attending: Vascular Surgery

## 2018-12-19 DIAGNOSIS — R7303 Prediabetes: Secondary | ICD-10-CM | POA: Diagnosis present

## 2018-12-19 DIAGNOSIS — Z79899 Other long term (current) drug therapy: Secondary | ICD-10-CM | POA: Diagnosis not present

## 2018-12-19 DIAGNOSIS — Z87442 Personal history of urinary calculi: Secondary | ICD-10-CM

## 2018-12-19 DIAGNOSIS — Z955 Presence of coronary angioplasty implant and graft: Secondary | ICD-10-CM

## 2018-12-19 DIAGNOSIS — I6521 Occlusion and stenosis of right carotid artery: Secondary | ICD-10-CM | POA: Diagnosis present

## 2018-12-19 DIAGNOSIS — I10 Essential (primary) hypertension: Secondary | ICD-10-CM | POA: Diagnosis present

## 2018-12-19 DIAGNOSIS — K449 Diaphragmatic hernia without obstruction or gangrene: Secondary | ICD-10-CM | POA: Diagnosis present

## 2018-12-19 DIAGNOSIS — M109 Gout, unspecified: Secondary | ICD-10-CM | POA: Diagnosis present

## 2018-12-19 DIAGNOSIS — I739 Peripheral vascular disease, unspecified: Secondary | ICD-10-CM | POA: Diagnosis present

## 2018-12-19 DIAGNOSIS — Z87891 Personal history of nicotine dependence: Secondary | ICD-10-CM | POA: Diagnosis not present

## 2018-12-19 HISTORY — PX: ENDARTERECTOMY: SHX5162

## 2018-12-19 LAB — GLUCOSE, CAPILLARY
Glucose-Capillary: 145 mg/dL — ABNORMAL HIGH (ref 70–99)
Glucose-Capillary: 168 mg/dL — ABNORMAL HIGH (ref 70–99)

## 2018-12-19 LAB — MRSA PCR SCREENING: MRSA by PCR: NEGATIVE

## 2018-12-19 LAB — ABO/RH: ABO/RH(D): A POS

## 2018-12-19 SURGERY — ENDARTERECTOMY, CAROTID
Anesthesia: General | Laterality: Right

## 2018-12-19 MED ORDER — GUAIFENESIN-DM 100-10 MG/5ML PO SYRP
15.0000 mL | ORAL_SOLUTION | ORAL | Status: DC | PRN
  Filled 2018-12-19: qty 15

## 2018-12-19 MED ORDER — ALBUMIN HUMAN 5 % IV SOLN
12.5000 g | Freq: Once | INTRAVENOUS | Status: AC
  Administered 2018-12-19: 12.5 g via INTRAVENOUS

## 2018-12-19 MED ORDER — DOCUSATE SODIUM 100 MG PO CAPS
100.0000 mg | ORAL_CAPSULE | Freq: Every day | ORAL | Status: DC
  Administered 2018-12-20: 100 mg via ORAL
  Filled 2018-12-19 (×2): qty 1

## 2018-12-19 MED ORDER — ATORVASTATIN CALCIUM 20 MG PO TABS
20.0000 mg | ORAL_TABLET | Freq: Every day | ORAL | Status: DC
  Administered 2018-12-20: 20 mg via ORAL
  Filled 2018-12-19 (×2): qty 1

## 2018-12-19 MED ORDER — KCL IN DEXTROSE-NACL 20-5-0.9 MEQ/L-%-% IV SOLN
INTRAVENOUS | Status: DC
  Filled 2018-12-19 (×4): qty 1000

## 2018-12-19 MED ORDER — PROPOFOL 10 MG/ML IV BOLUS
INTRAVENOUS | Status: AC
  Filled 2018-12-19: qty 20

## 2018-12-19 MED ORDER — DEXAMETHASONE SODIUM PHOSPHATE 10 MG/ML IJ SOLN
INTRAMUSCULAR | Status: DC | PRN
  Administered 2018-12-19: 5 mg via INTRAVENOUS

## 2018-12-19 MED ORDER — EVICEL 2 ML EX KIT
PACK | CUTANEOUS | Status: AC
  Filled 2018-12-19: qty 1

## 2018-12-19 MED ORDER — ACETAMINOPHEN 10 MG/ML IV SOLN
INTRAVENOUS | Status: DC | PRN
  Administered 2018-12-19: 1000 mg via INTRAVENOUS

## 2018-12-19 MED ORDER — ACETAMINOPHEN 10 MG/ML IV SOLN
INTRAVENOUS | Status: AC
  Filled 2018-12-19: qty 100

## 2018-12-19 MED ORDER — ALBUMIN HUMAN 5 % IV SOLN
INTRAVENOUS | Status: AC
  Filled 2018-12-19: qty 250

## 2018-12-19 MED ORDER — HEPARIN SODIUM (PORCINE) 1000 UNIT/ML IJ SOLN
INTRAMUSCULAR | Status: DC | PRN
  Administered 2018-12-19: 7000 [IU] via INTRAVENOUS

## 2018-12-19 MED ORDER — SODIUM CHLORIDE 0.9 % IV SOLN
INTRAVENOUS | Status: DC | PRN
  Administered 2018-12-19: 40 ug/min via INTRAVENOUS

## 2018-12-19 MED ORDER — FENTANYL CITRATE (PF) 250 MCG/5ML IJ SOLN
INTRAMUSCULAR | Status: AC
  Filled 2018-12-19: qty 5

## 2018-12-19 MED ORDER — DIPHENHYDRAMINE HCL 25 MG PO CAPS
25.0000 mg | ORAL_CAPSULE | Freq: Every day | ORAL | Status: DC
  Administered 2018-12-20: 25 mg via ORAL
  Filled 2018-12-19: qty 1

## 2018-12-19 MED ORDER — FAMOTIDINE 20 MG PO TABS
20.0000 mg | ORAL_TABLET | Freq: Once | ORAL | Status: AC
  Administered 2018-12-19: 20 mg via ORAL

## 2018-12-19 MED ORDER — DOPAMINE-DEXTROSE 3.2-5 MG/ML-% IV SOLN
2.0000 ug/kg/min | INTRAVENOUS | Status: DC
  Administered 2018-12-19: 5 ug/kg/min via INTRAVENOUS

## 2018-12-19 MED ORDER — ACETAMINOPHEN 325 MG RE SUPP
325.0000 mg | RECTAL | Status: DC | PRN
  Filled 2018-12-19: qty 2

## 2018-12-19 MED ORDER — DEXMEDETOMIDINE HCL 200 MCG/2ML IV SOLN
INTRAVENOUS | Status: DC | PRN
  Administered 2018-12-19: 12 ug via INTRAVENOUS

## 2018-12-19 MED ORDER — SODIUM CHLORIDE 0.9 % IV SOLN
INTRAVENOUS | Status: DC | PRN
  Administered 2018-12-19: 100 mL via INTRAMUSCULAR

## 2018-12-19 MED ORDER — DOPAMINE-DEXTROSE 3.2-5 MG/ML-% IV SOLN
2.0000 ug/kg/min | INTRAVENOUS | Status: DC
  Administered 2018-12-19: 6 ug/kg/min via INTRAVENOUS
  Administered 2018-12-19: 4 ug/kg/min via INTRAVENOUS
  Administered 2018-12-19: 2 ug/kg/min via INTRAVENOUS
  Administered 2018-12-19 (×2): 3 ug/kg/min via INTRAVENOUS
  Administered 2018-12-19: 4 ug/kg/min via INTRAVENOUS

## 2018-12-19 MED ORDER — FENTANYL CITRATE (PF) 100 MCG/2ML IJ SOLN
INTRAMUSCULAR | Status: DC | PRN
  Administered 2018-12-19: 50 ug via INTRAVENOUS
  Administered 2018-12-19 (×2): 100 ug via INTRAVENOUS

## 2018-12-19 MED ORDER — CEFAZOLIN SODIUM-DEXTROSE 2-4 GM/100ML-% IV SOLN
INTRAVENOUS | Status: AC
  Filled 2018-12-19: qty 100

## 2018-12-19 MED ORDER — LISINOPRIL 20 MG PO TABS
40.0000 mg | ORAL_TABLET | Freq: Every day | ORAL | Status: DC
  Filled 2018-12-19: qty 2

## 2018-12-19 MED ORDER — ALLOPURINOL 300 MG PO TABS
300.0000 mg | ORAL_TABLET | Freq: Every day | ORAL | Status: DC
  Administered 2018-12-20: 300 mg via ORAL
  Filled 2018-12-19: qty 1

## 2018-12-19 MED ORDER — LIDOCAINE HCL (PF) 1 % IJ SOLN
INTRAMUSCULAR | Status: AC
  Filled 2018-12-19: qty 30

## 2018-12-19 MED ORDER — DOPAMINE-DEXTROSE 3.2-5 MG/ML-% IV SOLN
INTRAVENOUS | Status: AC
  Administered 2018-12-19: 6 ug/kg/min via INTRAVENOUS
  Filled 2018-12-19: qty 250

## 2018-12-19 MED ORDER — OXYCODONE HCL 5 MG PO TABS
5.0000 mg | ORAL_TABLET | ORAL | Status: DC | PRN
  Filled 2018-12-19: qty 2

## 2018-12-19 MED ORDER — LACTATED RINGERS IV SOLN
INTRAVENOUS | Status: DC
  Administered 2018-12-19 (×2): via INTRAVENOUS

## 2018-12-19 MED ORDER — HYDROMORPHONE HCL 1 MG/ML IJ SOLN
INTRAMUSCULAR | Status: AC
  Filled 2018-12-19: qty 1

## 2018-12-19 MED ORDER — LACTATED RINGERS IV SOLN
INTRAVENOUS | Status: DC
  Administered 2018-12-19: 07:00:00 via INTRAVENOUS

## 2018-12-19 MED ORDER — LIDOCAINE HCL (CARDIAC) PF 100 MG/5ML IV SOSY
PREFILLED_SYRINGE | INTRAVENOUS | Status: DC | PRN
  Administered 2018-12-19: 100 mg via INTRAVENOUS

## 2018-12-19 MED ORDER — ONDANSETRON HCL 4 MG/2ML IJ SOLN: 4.0000 mg | Freq: Four times a day (QID) | INTRAMUSCULAR | Status: DC | PRN

## 2018-12-19 MED ORDER — ONDANSETRON HCL 4 MG/2ML IJ SOLN
4.0000 mg | Freq: Four times a day (QID) | INTRAMUSCULAR | Status: DC | PRN
  Administered 2018-12-19: 4 mg via INTRAVENOUS
  Filled 2018-12-19: qty 2

## 2018-12-19 MED ORDER — HYDROCHLOROTHIAZIDE 25 MG PO TABS
25.0000 mg | ORAL_TABLET | Freq: Every day | ORAL | Status: DC
  Filled 2018-12-19: qty 1

## 2018-12-19 MED ORDER — HYDROMORPHONE HCL 1 MG/ML IJ SOLN
1.0000 mg | Freq: Once | INTRAMUSCULAR | Status: AC | PRN
  Administered 2018-12-19: 1 mg via INTRAVENOUS

## 2018-12-19 MED ORDER — EVICEL 2 ML EX KIT
PACK | CUTANEOUS | Status: DC | PRN
  Administered 2018-12-19: 2 mL via TOPICAL

## 2018-12-19 MED ORDER — ALUM & MAG HYDROXIDE-SIMETH 200-200-20 MG/5ML PO SUSP
15.0000 mL | ORAL | Status: DC | PRN
  Filled 2018-12-19: qty 30

## 2018-12-19 MED ORDER — MORPHINE SULFATE (PF) 4 MG/ML IV SOLN: 2.0000 mg | INTRAVENOUS | Status: DC | PRN

## 2018-12-19 MED ORDER — METOPROLOL TARTRATE 5 MG/5ML IV SOLN
2.0000 mg | INTRAVENOUS | Status: DC | PRN
  Filled 2018-12-19: qty 5

## 2018-12-19 MED ORDER — EPHEDRINE SULFATE 50 MG/ML IJ SOLN
INTRAMUSCULAR | Status: DC | PRN
  Administered 2018-12-19 (×2): 10 mg via INTRAVENOUS

## 2018-12-19 MED ORDER — PANTOPRAZOLE SODIUM 40 MG IV SOLR
40.0000 mg | Freq: Every day | INTRAVENOUS | Status: DC
  Administered 2018-12-19: 40 mg via INTRAVENOUS
  Filled 2018-12-19 (×3): qty 40

## 2018-12-19 MED ORDER — ONDANSETRON HCL 4 MG/2ML IJ SOLN
INTRAMUSCULAR | Status: DC | PRN
  Administered 2018-12-19: 4 mg via INTRAVENOUS

## 2018-12-19 MED ORDER — ACETAMINOPHEN 325 MG PO TABS
325.0000 mg | ORAL_TABLET | ORAL | Status: DC | PRN
  Administered 2018-12-20: 650 mg via ORAL
  Filled 2018-12-19: qty 2

## 2018-12-19 MED ORDER — ROCURONIUM BROMIDE 100 MG/10ML IV SOLN
INTRAVENOUS | Status: DC | PRN
  Administered 2018-12-19: 50 mg via INTRAVENOUS
  Administered 2018-12-19: 10 mg via INTRAVENOUS

## 2018-12-19 MED ORDER — SODIUM CHLORIDE 0.9 % IV SOLN
INTRAVENOUS | Status: DC | PRN
  Administered 2018-12-19: 1000 mL via INTRAVENOUS

## 2018-12-19 MED ORDER — LABETALOL HCL 5 MG/ML IV SOLN
10.0000 mg | INTRAVENOUS | Status: DC | PRN
  Filled 2018-12-19: qty 4

## 2018-12-19 MED ORDER — SODIUM CHLORIDE 0.9 % IV SOLN
500.0000 mL | Freq: Once | INTRAVENOUS | Status: AC | PRN
  Administered 2018-12-19: 500 mL via INTRAVENOUS

## 2018-12-19 MED ORDER — HEPARIN SODIUM (PORCINE) 5000 UNIT/ML IJ SOLN
INTRAMUSCULAR | Status: AC
  Filled 2018-12-19: qty 1

## 2018-12-19 MED ORDER — SUGAMMADEX SODIUM 200 MG/2ML IV SOLN
INTRAVENOUS | Status: DC | PRN
  Administered 2018-12-19: 200 mg via INTRAVENOUS

## 2018-12-19 MED ORDER — METOPROLOL TARTRATE 25 MG PO TABS: 25.0000 mg | ORAL_TABLET | Freq: Every day | ORAL | Status: DC

## 2018-12-19 MED ORDER — FAMOTIDINE 20 MG PO TABS
ORAL_TABLET | ORAL | Status: AC
  Administered 2018-12-19: 20 mg via ORAL
  Filled 2018-12-19: qty 1

## 2018-12-19 MED ORDER — ASPIRIN EC 81 MG PO TBEC
81.0000 mg | DELAYED_RELEASE_TABLET | Freq: Every day | ORAL | Status: DC
  Administered 2018-12-20: 81 mg via ORAL
  Filled 2018-12-19 (×2): qty 1

## 2018-12-19 MED ORDER — PHENYLEPHRINE HCL 10 MG/ML IJ SOLN
INTRAMUSCULAR | Status: DC | PRN
  Administered 2018-12-19: 100 ug via INTRAVENOUS
  Administered 2018-12-19: 50 ug via INTRAVENOUS
  Administered 2018-12-19: 80 ug via INTRAVENOUS
  Administered 2018-12-19: 100 ug via INTRAVENOUS
  Administered 2018-12-19: 80 ug via INTRAVENOUS
  Administered 2018-12-19: 160 ug via INTRAVENOUS
  Administered 2018-12-19: 100 ug via INTRAVENOUS
  Administered 2018-12-19: 50 ug via INTRAVENOUS

## 2018-12-19 MED ORDER — LISINOPRIL-HYDROCHLOROTHIAZIDE 20-12.5 MG PO TABS: 2.0000 | ORAL_TABLET | Freq: Every day | ORAL | Status: DC

## 2018-12-19 MED ORDER — HYDROMORPHONE HCL 1 MG/ML IJ SOLN
0.2500 mg | INTRAMUSCULAR | Status: DC | PRN
  Administered 2018-12-19 (×4): 0.25 mg via INTRAVENOUS

## 2018-12-19 MED ORDER — HYDRALAZINE HCL 20 MG/ML IJ SOLN
5.0000 mg | INTRAMUSCULAR | Status: DC | PRN
  Filled 2018-12-19: qty 0.25

## 2018-12-19 MED ORDER — PHENOL 1.4 % MT LIQD
1.0000 | OROMUCOSAL | Status: DC | PRN
  Filled 2018-12-19 (×2): qty 177

## 2018-12-19 MED ORDER — CEFAZOLIN SODIUM-DEXTROSE 2-4 GM/100ML-% IV SOLN
2.0000 g | Freq: Three times a day (TID) | INTRAVENOUS | Status: AC
  Administered 2018-12-19 (×2): 2 g via INTRAVENOUS
  Filled 2018-12-19: qty 100

## 2018-12-19 MED ORDER — PROPOFOL 10 MG/ML IV BOLUS
INTRAVENOUS | Status: DC | PRN
  Administered 2018-12-19: 140 mg via INTRAVENOUS

## 2018-12-19 MED ORDER — ADULT MULTIVITAMIN W/MINERALS CH
1.0000 | ORAL_TABLET | Freq: Every day | ORAL | Status: DC
  Administered 2018-12-20: 1 via ORAL
  Filled 2018-12-19 (×2): qty 1

## 2018-12-19 SURGICAL SUPPLY — 69 items
APPLIER CLIP 11 MED OPEN (CLIP)
APPLIER CLIP 9.375 SM OPEN (CLIP)
BAG DECANTER FOR FLEXI CONT (MISCELLANEOUS) ×3 IMPLANT
BLADE SURG 15 STRL LF DISP TIS (BLADE) ×1 IMPLANT
BLADE SURG 15 STRL SS (BLADE) ×2
BLADE SURG SZ11 CARB STEEL (BLADE) ×3 IMPLANT
BOOT SUTURE AID YELLOW STND (SUTURE) ×6 IMPLANT
BRUSH SCRUB EZ  4% CHG (MISCELLANEOUS) ×2
BRUSH SCRUB EZ 4% CHG (MISCELLANEOUS) ×1 IMPLANT
CANISTER SUCT 1200ML W/VALVE (MISCELLANEOUS) ×3 IMPLANT
CLIP APPLIE 11 MED OPEN (CLIP) IMPLANT
CLIP APPLIE 9.375 SM OPEN (CLIP) IMPLANT
COVER WAND RF STERILE (DRAPES) ×1 IMPLANT
DERMABOND ADVANCED (GAUZE/BANDAGES/DRESSINGS) ×2
DERMABOND ADVANCED .7 DNX12 (GAUZE/BANDAGES/DRESSINGS) ×1 IMPLANT
DRAPE INCISE IOBAN 66X45 STRL (DRAPES) ×3 IMPLANT
DRAPE LAPAROTOMY 100X77 ABD (DRAPES) ×3 IMPLANT
DRAPE SHEET LG 3/4 BI-LAMINATE (DRAPES) ×3 IMPLANT
DRESSING SURGICEL FIBRLLR 1X2 (HEMOSTASIS) ×1 IMPLANT
DRSG SURGICEL FIBRILLAR 1X2 (HEMOSTASIS) ×3
DURAPREP 26ML APPLICATOR (WOUND CARE) ×3 IMPLANT
ELECT CAUTERY BLADE 6.4 (BLADE) ×3 IMPLANT
ELECT REM PT RETURN 9FT ADLT (ELECTROSURGICAL) ×3
ELECTRODE REM PT RTRN 9FT ADLT (ELECTROSURGICAL) ×1 IMPLANT
GLOVE BIO SURGEON STRL SZ7 (GLOVE) ×7 IMPLANT
GLOVE INDICATOR 7.5 STRL GRN (GLOVE) ×7 IMPLANT
GLOVE SURG SYN 8.0 (GLOVE) ×3 IMPLANT
GLOVE SURG SYN 8.0 PF PI (GLOVE) ×1 IMPLANT
GOWN STRL REUS W/ TWL LRG LVL3 (GOWN DISPOSABLE) ×2 IMPLANT
GOWN STRL REUS W/ TWL XL LVL3 (GOWN DISPOSABLE) ×1 IMPLANT
GOWN STRL REUS W/TWL LRG LVL3 (GOWN DISPOSABLE) ×6
GOWN STRL REUS W/TWL XL LVL3 (GOWN DISPOSABLE) ×2
HOLDER FOLEY CATH W/STRAP (MISCELLANEOUS) ×3 IMPLANT
IV NS 500ML (IV SOLUTION) ×2
IV NS 500ML BAXH (IV SOLUTION) ×1 IMPLANT
KIT TURNOVER KIT A (KITS) ×3 IMPLANT
LABEL OR SOLS (LABEL) ×3 IMPLANT
LOOP RED MAXI  1X406MM (MISCELLANEOUS) ×6
LOOP VESSEL MAXI 1X406 RED (MISCELLANEOUS) ×2 IMPLANT
LOOP VESSEL MINI 0.8X406 BLUE (MISCELLANEOUS) ×1 IMPLANT
LOOPS BLUE MINI 0.8X406MM (MISCELLANEOUS) ×2
NDL FILTER BLUNT 18X1 1/2 (NEEDLE) ×1 IMPLANT
NDL HYPO 25X1 1.5 SAFETY (NEEDLE) ×1 IMPLANT
NEEDLE FILTER BLUNT 18X 1/2SAF (NEEDLE) ×2
NEEDLE FILTER BLUNT 18X1 1/2 (NEEDLE) ×1 IMPLANT
NEEDLE HYPO 25X1 1.5 SAFETY (NEEDLE) ×3 IMPLANT
NS IRRIG 500ML POUR BTL (IV SOLUTION) ×3 IMPLANT
PACK BASIN MAJOR ARMC (MISCELLANEOUS) ×3 IMPLANT
PATCH CAROTID ECM VASC 1X10 (Prosthesis & Implant Heart) ×2 IMPLANT
PENCIL ELECTRO HAND CTR (MISCELLANEOUS) IMPLANT
SHUNT CAROTID STR REINF 3.0X4. (MISCELLANEOUS) ×3 IMPLANT
SUT MNCRL+ 5-0 UNDYED PC-3 (SUTURE) ×1 IMPLANT
SUT MONOCRYL 5-0 (SUTURE) ×4
SUT PROLENE 6 0 BV (SUTURE) ×30 IMPLANT
SUT PROLENE 7 0 BV 1 (SUTURE) ×16 IMPLANT
SUT SILK 2 0 (SUTURE) ×2
SUT SILK 2-0 18XBRD TIE 12 (SUTURE) ×1 IMPLANT
SUT SILK 3 0 (SUTURE) ×2
SUT SILK 3-0 18XBRD TIE 12 (SUTURE) ×1 IMPLANT
SUT SILK 4 0 (SUTURE) ×2
SUT SILK 4-0 18XBRD TIE 12 (SUTURE) ×1 IMPLANT
SUT VIC AB 3-0 SH 27 (SUTURE) ×2
SUT VIC AB 3-0 SH 27X BRD (SUTURE) ×1 IMPLANT
SYR 10ML LL (SYRINGE) ×1 IMPLANT
SYR 20CC LL (SYRINGE) ×3 IMPLANT
SYR 3ML LL SCALE MARK (SYRINGE) ×3 IMPLANT
TRAY FOLEY MTR SLVR 16FR STAT (SET/KITS/TRAYS/PACK) ×3 IMPLANT
TUBING CONNECTING 10 (TUBING) IMPLANT
TUBING CONNECTING 10' (TUBING)

## 2018-12-19 NOTE — Anesthesia Procedure Notes (Signed)
Procedure Name: Intubation Performed by: Marcy Siren, CRNA Pre-anesthesia Checklist: Patient identified, Emergency Drugs available, Suction available, Patient being monitored and Timeout performed Patient Re-evaluated:Patient Re-evaluated prior to induction Oxygen Delivery Method: Circle system utilized Preoxygenation: Pre-oxygenation with 100% oxygen Induction Type: IV induction Ventilation: Mask ventilation without difficulty Laryngoscope Size: Mac and 3 Grade View: Grade I Tube type: Oral Tube size: 7.5 mm Number of attempts: 1 Placement Confirmation: ETT inserted through vocal cords under direct vision,  positive ETCO2,  CO2 detector and breath sounds checked- equal and bilateral Secured at: 23 cm Tube secured with: Tape Dental Injury: Teeth and Oropharynx as per pre-operative assessment

## 2018-12-19 NOTE — Transfer of Care (Signed)
Immediate Anesthesia Transfer of Care Note  Patient: Victor Rojas  Procedure(s) Performed: ENDARTERECTOMY CAROTID (Right )  Patient Location: PACU  Anesthesia Type:General  Level of Consciousness: awake  Airway & Oxygen Therapy: Patient Spontanous Breathing  Post-op Assessment: Report given to RN  Post vital signs: stable  Last Vitals:  Vitals Value Taken Time  BP    Temp    Pulse 75 12/19/2018 10:54 AM  Resp    SpO2 92 % 12/19/2018 10:54 AM  Vitals shown include unvalidated device data.  Last Pain:  Vitals:   12/19/18 0626  TempSrc: Tympanic  PainSc: 0-No pain         Complications: No apparent anesthesia complications

## 2018-12-19 NOTE — Anesthesia Postprocedure Evaluation (Signed)
Anesthesia Post Note  Patient: Victor Rojas  Procedure(s) Performed: ENDARTERECTOMY CAROTID (Right )  Patient location during evaluation: PACU Anesthesia Type: General Level of consciousness: awake and alert Pain management: pain level controlled Vital Signs Assessment: post-procedure vital signs reviewed and stable Respiratory status: spontaneous breathing, nonlabored ventilation, respiratory function stable and patient connected to nasal cannula oxygen Cardiovascular status: blood pressure returned to baseline and stable Postop Assessment: no apparent nausea or vomiting Anesthetic complications: no     Last Vitals:  Vitals:   12/19/18 1440 12/19/18 1445  BP:    Pulse: 62 63  Resp: 17 18  Temp:    SpO2: 98% 97%    Last Pain:  Vitals:   12/19/18 1445  TempSrc:   PainSc: [place]

## 2018-12-19 NOTE — OR Nursing (Signed)
Patient talking to family, waiting on a bed, blood pressure maintaining in the 119/51 on Dopamine at 71mcg/kg/min. No complaints except the catheter at this time.

## 2018-12-19 NOTE — Anesthesia Preprocedure Evaluation (Addendum)
Anesthesia Evaluation  Patient identified by MRN, date of birth, ID band Patient awake    Reviewed: Allergy & Precautions, H&P , NPO status , Patient's Chart, lab work & pertinent test results  Airway Mallampati: III       Dental  (+) Upper Dentures, Partial Lower   Pulmonary shortness of breath and with exertion, neg COPD, former smoker,           Cardiovascular hypertension, (-) angina(-) Cardiac Stents      Neuro/Psych negative neurological ROS  negative psych ROS   GI/Hepatic Neg liver ROS, hiatal hernia,   Endo/Other  negative endocrine ROS  Renal/GU      Musculoskeletal   Abdominal   Peds  Hematology negative hematology ROS (+)   Anesthesia Other Findings Past Medical History: No date: Arthritis No date: Hiatal hernia No date: History of gout No date: History of kidney stones No date: Hypertension No date: Peripheral vascular disease ([place]) No date: Pre-diabetes  Past Surgical History: No date: COLONOSCOPY 06/18/2016: COLONOSCOPY WITH PROPOFOL; N/A     Comment:  Procedure: COLONOSCOPY WITH PROPOFOL;  Surgeon: Lollie Sails, MD;  Location: [facility] ENDOSCOPY;  Service:               Endoscopy;  Laterality: N/A; 06/18/2016: ESOPHAGOGASTRODUODENOSCOPY (EGD) WITH PROPOFOL; N/A     Comment:  Procedure: ESOPHAGOGASTRODUODENOSCOPY (EGD) WITH               PROPOFOL;  Surgeon: Lollie Sails, MD;  Location:               [facility] ENDOSCOPY;  Service: Endoscopy;  Laterality: N/A; No date: HERNIA REPAIR     Comment:  umbilical No date: TONSILLECTOMY  BMI    Body Mass Index:  30.70 kg/m      Reproductive/Obstetrics negative OB ROS                            Anesthesia Physical Anesthesia Plan  ASA: III  Anesthesia Plan: General ETT   Post-op Pain Management:    Induction:   PONV Risk Score and Plan: Ondansetron, Dexamethasone and Treatment may vary due to age or  medical condition  Airway Management Planned: Oral ETT  Additional Equipment: Arterial line  Intra-op Plan:   Post-operative Plan: Extubation in OR  Informed Consent: I have reviewed the patients History and Physical, chart, labs and discussed the procedure including the risks, benefits and alternatives for the proposed anesthesia with the patient or authorized representative who has indicated his/her understanding and acceptance.   Dental Advisory Given  Plan Discussed with: Anesthesiologist, CRNA and Surgeon  Anesthesia Plan Comments:        Anesthesia Quick Evaluation

## 2018-12-19 NOTE — Anesthesia Post-op Follow-up Note (Signed)
Anesthesia QCDR form completed.        

## 2018-12-19 NOTE — Op Note (Signed)
[facility]   OPERATIVE NOTE  PROCEDURE:   1.  Right carotid endarterectomy with CorMatrix arterial patch reconstruction  PRE-OPERATIVE DIAGNOSIS: 1.  Critical carotid stenosis 2.  Hypertension  POST-OPERATIVE DIAGNOSIS: same as above   SURGEON: Katha Cabal, MD  ASSISTANT(S): Ms. Hezzie Bump  ANESTHESIA: general  ESTIMATED BLOOD LOSS: 75 cc  FINDING(S): 1.  Extensive calcified carotid plaque.  SPECIMEN(S):  Carotid plaque (sent to Pathology)  INDICATIONS:   Victor Rojas is a 73 y.o. y.o. male who presents with right carotid stenosis of 95 %.  The risks, benefits, and alternatives to carotid endarterectomy were discussed with the patient. The differences between carotid stenting and carotid endarterectomy were reviewed.  The patient voiced understanding and appears to be aware that the risks of carotid endarterectomy include but are not limited to: bleeding, infection, stroke, myocardial infarction, death, cranial nerve injuries both temporary and permanent, neck hematoma, possible airway compromise, labile blood pressure post-operatively, cerebral hyperperfusion syndrome, and possible need for additional interventions in the future. The patient is aware of the risks and agrees to proceed forward with the procedure.  DESCRIPTION: After full informed written consent was obtained from the patient, the patient was brought back to the operating room and placed supine upon the operating table.  Prior to induction, the patient received IV antibiotics.  After obtaining adequate anesthesia, the patient was placed a supine position with a shoulder roll in place and the patient's neck slightly hyperextended and rotated away from the surgical site.  The patient was prepped in the standard fashion for a carotid endarterectomy.    A first assistant was required to provide a safe and appropriate environment for executing the surgery.  The assistant was integral in  providing retraction, exposure, running suture providing suction and in the closing process.  The incision was made anterior to the sternocleidomastoid muscle and dissected down through the subcutaneous tissue.  The platysmas was opened with electrocautery.  The internal jugular vein and facial vein were identified.  The facial vein is ligated and divided between 2-0 silk ties.  The omohyoid was identified in the common carotid artery exposed at this level. The dissection was there in carried out along the carotid artery in a cranial direction.  The dissection was then carried along periadventitial plane along the common carotid artery up to the bifurcation. The external carotid artery was identified. Vessel loops were then placed around the external carotid artery as well as the superior thyroid artery. In the process of this dissection, the hypoglossal nerve was identified and protected from harm.  The internal carotid artery was then dissected circumferentially just beyond an area in the internal carotid artery distal to the plaque.    At this point, we gave the patient 7000 units of intravenous heparin.  After this was allowed to circulate for several minutes, the common carotid followed by the external carotid and then the internal carotid artery were clamped.  Arteriotomy was made in the common carotid artery with a 11 blade, and extended the arteriotomy with a Potts scissor down into the common carotid artery, then the arteriotomy was carried through the bifurcation into the internal carotid artery until I reached an area that was not diseased.  At this point, a Sundt shunt was placed.  The endarterectomy was begun in the common carotid artery with a Garment/textile technologist and carried this dissection down into the common carotid artery circumferentially.  Then I transected the plaque at a segment where  it was adherent and transected the plaque with Potts scissors.  I then carried this dissection up into  the external carotid artery.  The plaque was extracted by unclamping the external carotid artery and performing an eversion endarterectomy.  The dissection was then carried into the internal carotid artery where a  feathered end point was created.  The plaque was passed off the field as a specimen.  The distal endpoint was tacked down with 6 interrupted 7-0 Prolene sutures.  A CorMatrix arterial patch was delivered onto the field and trimmed appropriately for the artery and sewed in place with 6-0 Prolene using a 4 quadrant technique.  The medial suture line was completed and the lateral suture line was run approximately one quarter the length of the arteriotomy.  Prior to completing this patch angioplasty, the shunt was removed, the internal carotid artery was flushed and there was excellent backbleeding.  The carotid artery repair was flushed with heparinized saline and then the patch angioplasty was completed in the usual fashion.  The flow was then reestablished first to the external carotid artery and then the internal carotid artery to prevent distal embolization.   Several minutes of pressure were held and 6-0 Prolene patch sutures were used as need for hemostasis.  At this point, I placed Surgicel and Evicel topical hemostatic agents.  There was no more active bleeding in the surgical site.  The sternocleidomastoid space was closed with three interrupted 3-0 Vicryl sutures. I then reapproximated the platysma muscle with a running stitch of 3-0 Vicryl.  The skin was then closed with a running subcuticular 4-0 Monocryl.  The skin was then cleaned, dried and Dermabond was used to reinforce the skin closure.  The patient awakened and was taken to the recovery room in stable condition, following commands and moving all four extremities without any apparent deficits.    COMPLICATIONS: none  CONDITION: stable  [name] 12/19/2018<10:38 AM

## 2018-12-19 NOTE — Anesthesia Procedure Notes (Signed)
Arterial Line Insertion Start/End1/09/2019 7:50 AM, 12/19/2018 7:56 AM Performed by: Durenda Hurt, MD, anesthesiologist  Patient location: OR. Preanesthetic checklist: patient identified, IV checked, site marked, risks and benefits discussed, surgical consent, monitors and equipment checked, pre-op evaluation, timeout performed and anesthesia consent Patient sedated radial was placed Catheter size: 20 Fr Hand hygiene performed   Attempts: 2 Procedure performed without using ultrasound guided technique. Following insertion, dressing applied. Post procedure assessment: normal and unchanged  Patient tolerated the procedure well with no immediate complications.

## 2018-12-19 NOTE — H&P (Signed)
[facility] History & Physical Update  The patient was interviewed and re-examined.  The patient's previous History and Physical has been reviewed and is unchanged.  There is no change in the plan of care. We plan to proceed with the scheduled procedure.  [name], MD  12/19/2018, 7:26 AM

## 2018-12-19 NOTE — Progress Notes (Signed)
Notified Dr. Lincoln Maxin that dopamine order was discontinued. BP 82/50 MAP 62 with dopamine discontinued. Verbal order obtained to continue dopamine gtt and current parameters.

## 2018-12-20 LAB — CBC
HCT: 42.4 % (ref 39.0–52.0)
Hemoglobin: 14 g/dL (ref 13.0–17.0)
MCH: 35.6 pg — ABNORMAL HIGH (ref 26.0–34.0)
MCHC: 33 g/dL (ref 30.0–36.0)
MCV: 107.9 fL — ABNORMAL HIGH (ref 80.0–100.0)
NRBC: 0 % (ref 0.0–0.2)
Platelets: 116 10*3/uL — ABNORMAL LOW (ref 150–400)
RBC: 3.93 MIL/uL — ABNORMAL LOW (ref 4.22–5.81)
RDW: 12.9 % (ref 11.5–15.5)
WBC: 10.5 10*3/uL (ref 4.0–10.5)

## 2018-12-20 LAB — BASIC METABOLIC PANEL
Anion gap: 10 (ref 5–15)
BUN: 25 mg/dL — ABNORMAL HIGH (ref 8–23)
CALCIUM: 8.5 mg/dL — AB (ref 8.9–10.3)
CO2: 24 mmol/L (ref 22–32)
Chloride: 106 mmol/L (ref 98–111)
Creatinine, Ser: 1 mg/dL (ref 0.61–1.24)
GFR calc Af Amer: >60 mL/min (ref >60)
GFR calc non Af Amer: >60 mL/min (ref >60)
Glucose, Bld: 164 mg/dL — ABNORMAL HIGH (ref 70–99)
Potassium: 4.5 mmol/L (ref 3.5–5.1)
Sodium: 140 mmol/L (ref 135–145)

## 2018-12-20 MED ORDER — DOCUSATE SODIUM 100 MG PO CAPS: 100.0000 mg | ORAL_CAPSULE | Freq: Every day | ORAL | 0 refills | Status: DC

## 2018-12-20 MED ORDER — OXYCODONE-ACETAMINOPHEN 5-325 MG PO TABS: 1.0000 | ORAL_TABLET | ORAL | 0 refills | Status: DC | PRN

## 2018-12-20 NOTE — Progress Notes (Signed)
1300 D/C home with daughter.Post op education and discharge education completed. All questions answered. Prescription for Percocet sent home with patient.

## 2018-12-20 NOTE — Progress Notes (Signed)
[facility]  Daily Progress Note   Subjective  - 1 Day Post-Op  No issues overnight.  Off vasoactive medications and BP stable.  C/o neck pain/HA improved with tylenol.  Has not met DTV yet, has not ambulated yet.  Otherwise no complaints.  Objective Vitals:   12/20/18 0600 12/20/18 0700 12/20/18 0800 12/20/18 0900  BP: (!) 116/51 108/61 101/70 (!) 101/47  Pulse: (!) 55 (!) 58 (!) 58 (!) 50  Resp: '12 10 19 ' (!) 24  Temp:   98.7 F (37.1 C)   TempSrc:      SpO2: 98% 98% 94% 98%  Weight:      Height:        Intake/Output Summary (Last 24 hours) at 12/20/2018 1126 Last data filed at 12/20/2018 1015 Gross per 24 hour  Intake 2082.73 ml  Output 1500 ml  Net 582.73 ml    PULM  CTAB CV  RRR VASC  Right neck incision c/d/i with small hematoma, neuro intact  Laboratory CBC    Component Value Date/Time   WBC 10.5 12/20/2018 0429   HGB 14.0 12/20/2018 0429   HCT 42.4 12/20/2018 0429   PLT 116 (L) 12/20/2018 0429    BMET    Component Value Date/Time   NA 140 12/20/2018 0429   K 4.5 12/20/2018 0429   CL 106 12/20/2018 0429   CO2 24 12/20/2018 0429   GLUCOSE 164 (H) 12/20/2018 0429   BUN 25 (H) 12/20/2018 0429   CREATININE 1.00 12/20/2018 0429   CALCIUM 8.5 (L) 12/20/2018 0429   GFRNONAA >60 12/20/2018 0429   GFRAA >60 12/20/2018 0429    Assessment/Planning: POD #1 s/p right CEA   Discharge home pending DTV and ambulatory  Lateia Fraser E Juron Vorhees  12/20/2018, 11:26 AM

## 2018-12-20 NOTE — Discharge Summary (Signed)
[facility]    Discharge Summary    Patient ID:  Victor Rojas MRN: [id number] DOB/AGE: [birth year] 73 y.o.  Admit date: 12/19/2018 Discharge date: 12/20/2018 Date of Surgery: 12/19/2018 Surgeon: Surgeon(s): Schnier, Dolores Lory, MD  Admission Diagnosis: CAROTID ARTERY STENOSIS  Discharge Diagnoses:  CAROTID ARTERY STENOSIS  Secondary Diagnoses: Past Medical History:  Diagnosis Date  . Arthritis   . Hiatal hernia   . History of gout   . History of kidney stones   . Hypertension   . Peripheral vascular disease ([place])   . Pre-diabetes     Procedure(s): ENDARTERECTOMY CAROTID  Discharged Condition: good  HPI:  Victor Rojas is a 73 yo male with asymptomatic right carotid artery stenosis.    Hospital Course:  Victor Rojas is a 72 y.o. male is S/P Right carotid endarterectomy.  The surgery was uncomplicated and he recovered in the ICU overnight. His blood pressure remained stable without vasoactive medications and he remained neurologically intact.  On postoperative day 1 he was discharged to home with outpatient follow up scheduled.   Procedure(s): ENDARTERECTOMY CAROTID Extubated: POD # 0 Physical exam: AAOx3 in NAD, RRR, CTA b/l, Neuro intact Post-op wounds clean, dry, intact, small hematoma Pt. Ambulating, voiding and taking PO diet without difficulty. Pt pain controlled with PO pain meds. Labs as below Complications:none  Consults:    Significant Diagnostic Studies: CBC Lab Results  Component Value Date   WBC 10.5 12/20/2018   HGB 14.0 12/20/2018   HCT 42.4 12/20/2018   MCV 107.9 (H) 12/20/2018   PLT 116 (L) 12/20/2018    BMET    Component Value Date/Time   NA 140 12/20/2018 0429   K 4.5 12/20/2018 0429   CL 106 12/20/2018 0429   CO2 24 12/20/2018 0429   GLUCOSE 164 (H) 12/20/2018 0429   BUN 25 (H) 12/20/2018 0429   CREATININE 1.00 12/20/2018 0429   CALCIUM 8.5 (L) 12/20/2018 0429   GFRNONAA >60 12/20/2018 0429   GFRAA >60 12/20/2018 0429   COAG Lab Results  Component Value Date   INR 1.08 12/15/2018     Disposition:  Discharge to :Home Discharge Instructions    CAROTID [place]: Call MD for difficulty swallowing or speaking; weakness in arms or legs that is a new symtom; severe headache.  If you have increased swelling in the neck and/or  are having difficulty breathing, CALL 911   Complete by:  As directed    Call MD for:  redness, tenderness, or signs of infection (pain, swelling, bleeding, redness, odor or green/yellow discharge around incision site)   Complete by:  As directed    Call MD for:  severe or increased pain, loss or decreased feeling  in affected limb(s)   Complete by:  As directed    Call MD for:  temperature >100.5   Complete by:  As directed    Driving Restrictions   Complete by:  As directed    No driving for 1 weeks or while taking narcotic pain medication   Lifting restrictions   Complete by:  As directed    No lifting for greater than 20 pounds for 4 weeks.   No dressing needed   Complete by:  As directed    Replace only if drainage present   Resume previous diet   Complete by:  As directed      Allergies as of 12/20/2018   No Known Allergies     Medication List    TAKE  these medications   allopurinol 300 MG tablet Commonly known as:  ZYLOPRIM Take 300 mg by mouth daily.   aspirin EC 81 MG tablet Take 81 mg by mouth daily.   atorvastatin 20 MG tablet Commonly known as:  LIPITOR Take 20 mg by mouth daily.   diphenhydrAMINE 25 mg capsule Commonly known as:  BENADRYL Take 25 mg by mouth daily.   docusate sodium 100 MG capsule Commonly known as:  COLACE Take 1 capsule (100 mg total) by mouth daily.   lisinopril-hydrochlorothiazide 20-12.5 MG tablet Commonly known as:  PRINZIDE,ZESTORETIC Take 2 tablets by mouth daily.   metoprolol tartrate 25 MG tablet Commonly known as:  LOPRESSOR Take 25 mg by mouth daily.   MULTI-VITAMINS Tabs Take 1  tablet by mouth daily.   oxyCODONE-acetaminophen 5-325 MG tablet Commonly known as:  PERCOCET Take 1 tablet by mouth every 4 (four) hours as needed for severe pain.      Verbal and written Discharge instructions given to the patient. Wound care per Discharge AVS Follow-up Information    Schnier, Dolores Lory, MD Follow up in 10 day(s).   Specialties:  Vascular Surgery, Cardiology, Radiology, Vascular Surgery Why:  no studies Contact information: [place] [address] [phone]           Signed: Murray Hodgkins, MD  12/20/2018, 11:34 AM

## 2018-12-22 ENCOUNTER — Encounter: Payer: Self-pay | Admitting: Vascular Surgery

## 2018-12-22 LAB — SURGICAL PATHOLOGY

## 2018-12-23 ENCOUNTER — Ambulatory Visit (INDEPENDENT_AMBULATORY_CARE_PROVIDER_SITE_OTHER): Payer: Medicare Other | Admitting: Nurse Practitioner

## 2018-12-23 ENCOUNTER — Encounter (INDEPENDENT_AMBULATORY_CARE_PROVIDER_SITE_OTHER): Payer: Self-pay | Admitting: Nurse Practitioner

## 2018-12-23 VITALS — BP 149/84 | HR 67 | Resp 16 | Ht 69.0 in | Wt 210.0 lb

## 2018-12-23 DIAGNOSIS — I6521 Occlusion and stenosis of right carotid artery: Secondary | ICD-10-CM

## 2018-12-23 DIAGNOSIS — I10 Essential (primary) hypertension: Secondary | ICD-10-CM

## 2018-12-23 DIAGNOSIS — E782 Mixed hyperlipidemia: Secondary | ICD-10-CM

## 2018-12-24 ENCOUNTER — Encounter (INDEPENDENT_AMBULATORY_CARE_PROVIDER_SITE_OTHER): Payer: Self-pay | Admitting: Nurse Practitioner

## 2018-12-24 NOTE — Progress Notes (Signed)
Subjective:    Patient ID: Victor Rojas, male    DOB: April 01, 1946, 73 y.o.   MRN: [id number] Chief Complaint  Patient presents with  . Follow-up  . neck swelling    HPI  Victor Rojas is a 73 y.o. male that presents today for concerns of swelling and bruising of his right neck following carotid endarterectomy on 12/19/2018.  The wound appears well approximated with no signs of erythremia, drainage or dehiscence.  There is bruising on the neck running from the incision to the other ear as well as some on the shoulder.  There is also edema however his trachea is midline.  Patient denies any fever, chills, nausea, vomiting or diarrhea.  Patient does report having some difficulty when he lays flat has found that sitting up will help when he sleeps.  Otherwise he is just sore and uncomfortable.  He denies any chest pain or shortness of breath.  He denies any difficulty breathing.  Past Medical History:  Diagnosis Date  . Arthritis   . Hiatal hernia   . History of gout   . History of kidney stones   . Hypertension   . Peripheral vascular disease ([place])   . Pre-diabetes     Past Surgical History:  Procedure Laterality Date  . COLONOSCOPY    . COLONOSCOPY WITH PROPOFOL N/A 06/18/2016   Procedure: COLONOSCOPY WITH PROPOFOL;  Surgeon: Lollie Sails, MD;  Location: [facility] ENDOSCOPY;  Service: Endoscopy;  Laterality: N/A;  . ENDARTERECTOMY Right 12/19/2018   Procedure: ENDARTERECTOMY CAROTID;  Surgeon: Katha Cabal, MD;  Location: [facility] ORS;  Service: Vascular;  Laterality: Right;  . ESOPHAGOGASTRODUODENOSCOPY (EGD) WITH PROPOFOL N/A 06/18/2016   Procedure: ESOPHAGOGASTRODUODENOSCOPY (EGD) WITH PROPOFOL;  Surgeon: Lollie Sails, MD;  Location: [facility] ENDOSCOPY;  Service: Endoscopy;  Laterality: N/A;  . HERNIA REPAIR     umbilical  . TONSILLECTOMY      Social History   Socioeconomic History  . Marital status: Married    Spouse name: Not on file  . Number of children: Not  on file  . Years of education: Not on file  . Highest education level: Not on file  Occupational History  . Not on file  Social Needs  . Financial resource strain: Not on file  . Food insecurity:    Worry: Not on file    Inability: Not on file  . Transportation needs:    Medical: Not on file    Non-medical: Not on file  Tobacco Use  . Smoking status: Former Research scientist (life sciences)  . Smokeless tobacco: Never Used  Substance and Sexual Activity  . Alcohol use: Yes    Alcohol/week: 6.0 standard drinks    Types: 6 Cans of beer per week  . Drug use: No  . Sexual activity: Not on file  Lifestyle  . Physical activity:    Days per week: Not on file    Minutes per session: Not on file  . Stress: Not on file  Relationships  . Social connections:    Talks on phone: Not on file    Gets together: Not on file    Attends religious service: Not on file    Active member of club or organization: Not on file    Attends meetings of clubs or organizations: Not on file    Relationship status: Not on file  . Intimate partner violence:    Fear of current or ex partner: Not on file    Emotionally abused: Not on  file    Physically abused: Not on file    Forced sexual activity: Not on file  Other Topics Concern  . Not on file  Social History Narrative  . Not on file    No family history on file.  No Known Allergies   Review of Systems   Review of Systems: Negative Unless Checked Constitutional: [] Weight loss  [] Fever  [] Chills Cardiac: [] Chest pain   []  Atrial Fibrillation  [] Palpitations   [x] Shortness of breath when laying flat   [] Shortness of breath with exertion. [] Shortness of breath at rest Vascular:  [] Pain in legs with walking   [] Pain in legs with standing [] Pain in legs when laying flat   [] Claudication    [] Pain in feet when laying flat    [] History of DVT   [] Phlebitis   [] Swelling in legs   [] Varicose veins   [] Non-healing ulcers Pulmonary:   [] Uses home oxygen   [] Productive cough    [] Hemoptysis   [] Wheeze  [] COPD   [] Asthma Neurologic:  [] Dizziness   [] Seizures  [] Blackouts [] History of stroke   [] History of TIA  [] Aphasia   [] Temporary Blindness   [] Weakness or numbness in arm   [] Weakness or numbness in leg Musculoskeletal:   [] Joint swelling   [] Joint pain   [] Low back pain  []  History of Knee Replacement [] Arthritis [] back Surgeries  []  Spinal Stenosis    Hematologic:  [] Easy bruising  [] Easy bleeding   [] Hypercoagulable state   [] Anemic Gastrointestinal:  [] Diarrhea   [] Vomiting  [] Gastroesophageal reflux/heartburn   [] Difficulty swallowing. [] Abdominal pain Genitourinary:  [] Chronic kidney disease   [] Difficult urination  [] Anuric   [] Blood in urine [] Frequent urination  [] Burning with urination   [] Hematuria Skin:  [] Rashes   [] Ulcers [] Wounds Psychological:  [] History of anxiety   []  History of major depression  []  Memory Difficulties     Objective:   Physical Exam  BP (!) 149/84 (BP Location: Left Arm, Patient Position: Sitting, Cuff Size: Large)   Pulse 67   Resp 16   Ht 5\' 9"  (1.753 m)   Wt 210 lb (95.3 kg)   BMI 31.01 kg/m   Gen: WD/WN, NAD Head: [place]/AT, No temporalis wasting.  Ear/Nose/Throat: Hearing grossly intact, nares w/o erythema or drainage Eyes: PER, EOMI, sclera nonicteric.  Neck: Supple, no masses.  No JVD.  Pulmonary:  Good tracheal air movement, no use of accessory muscles. Trachea Midline. Cardiac: RRR Vascular: well approximated midline scar, no drainage, swelling and bruising present Vessel Right Left  Radial Palpable Palpable   Gastrointestinal: soft, non-distended. No guarding/no peritoneal signs.  Musculoskeletal: M/S 5/5 throughout.  No deformity or atrophy.  Neurologic: Pain and light touch intact in extremities.  Symmetrical.  Speech is fluent. Motor exam as listed above. Psychiatric: Judgment intact, Mood & affect appropriate for pt's clinical situation. Dermatologic: No Venous rashes. No Ulcers Noted.  No changes consistent  with cellulitis. Lymph : No Cervical lymphadenopathy, no lichenification or skin changes of chronic lymphedema.      Assessment & Plan:   1. Carotid stenosis, asymptomatic, right The patient has swelling and bruising that is expected with surgery.  I spoke about different positions to get relief with the swelling and how long he can expect to be uncomfortable.    We also discussed that the bruising may become worse before it gets better.  I also alerted him to when to seek emergency treatment, such as increased swelling in the neck, inability to breath when sitting up, fever or  purulent drainage from the incision.    2. Essential hypertension Continue antihypertensive medications as already ordered, these medications have been reviewed and there are no changes at this time.   3. Mixed hyperlipidemia Continue statin as ordered and reviewed, no changes at this time    Current Outpatient Medications on File Prior to Visit  Medication Sig Dispense Refill  . allopurinol (ZYLOPRIM) 300 MG tablet Take 300 mg by mouth daily.    Marland Kitchen aspirin EC 81 MG tablet Take 81 mg by mouth daily.     Marland Kitchen atorvastatin (LIPITOR) 20 MG tablet Take 20 mg by mouth daily.  11  . diphenhydrAMINE (BENADRYL) 25 mg capsule Take 25 mg by mouth daily.    Marland Kitchen docusate sodium (COLACE) 100 MG capsule Take 1 capsule (100 mg total) by mouth daily. 10 capsule 0  . lisinopril-hydrochlorothiazide (PRINZIDE,ZESTORETIC) 20-12.5 MG tablet Take 2 tablets by mouth daily.     . metoprolol tartrate (LOPRESSOR) 25 MG tablet Take 25 mg by mouth daily.     . Multiple Vitamin (MULTI-VITAMINS) TABS Take 1 tablet by mouth daily.     Marland Kitchen oxyCODONE-acetaminophen (PERCOCET) 5-325 MG tablet Take 1 tablet by mouth every 4 (four) hours as needed for severe pain. (Patient not taking: Reported on 12/23/2018) 10 tablet 0   No current facility-administered medications on file prior to visit.     There are no Patient Instructions on file for this  visit. No follow-ups on file.   Kris Hartmann, NP  This note was completed with Sales executive.  Any errors are purely unintentional.

## 2018-12-25 NOTE — Addendum Note (Signed)
Addendum  created 12/25/18 1010 by Doreen Salvage, CRNA   Charge Capture section accepted

## 2018-12-29 ENCOUNTER — Ambulatory Visit (INDEPENDENT_AMBULATORY_CARE_PROVIDER_SITE_OTHER): Payer: Medicare Other | Admitting: Vascular Surgery

## 2019-02-03 ENCOUNTER — Ambulatory Visit (INDEPENDENT_AMBULATORY_CARE_PROVIDER_SITE_OTHER): Payer: Medicare Other | Admitting: Nurse Practitioner

## 2019-02-03 ENCOUNTER — Encounter (INDEPENDENT_AMBULATORY_CARE_PROVIDER_SITE_OTHER): Payer: Self-pay | Admitting: Vascular Surgery

## 2019-02-03 ENCOUNTER — Ambulatory Visit (INDEPENDENT_AMBULATORY_CARE_PROVIDER_SITE_OTHER): Payer: Medicare Other

## 2019-02-03 ENCOUNTER — Other Ambulatory Visit: Payer: Self-pay

## 2019-02-03 ENCOUNTER — Other Ambulatory Visit (INDEPENDENT_AMBULATORY_CARE_PROVIDER_SITE_OTHER): Payer: Self-pay | Admitting: Vascular Surgery

## 2019-02-03 VITALS — BP 114/75 | HR 64 | Resp 10 | Ht 69.0 in | Wt 211.0 lb

## 2019-02-03 DIAGNOSIS — E782 Mixed hyperlipidemia: Secondary | ICD-10-CM

## 2019-02-03 DIAGNOSIS — I6521 Occlusion and stenosis of right carotid artery: Secondary | ICD-10-CM | POA: Diagnosis not present

## 2019-02-03 DIAGNOSIS — Z7982 Long term (current) use of aspirin: Secondary | ICD-10-CM

## 2019-02-03 DIAGNOSIS — Z9889 Other specified postprocedural states: Secondary | ICD-10-CM | POA: Diagnosis not present

## 2019-02-03 DIAGNOSIS — Z87891 Personal history of nicotine dependence: Secondary | ICD-10-CM

## 2019-02-03 DIAGNOSIS — I10 Essential (primary) hypertension: Secondary | ICD-10-CM

## 2019-02-03 NOTE — Progress Notes (Signed)
SUBJECTIVE:  Patient ID: Victor Rojas, male    DOB: Mar 27, 1946, 73 y.o.   MRN: [id number] Chief Complaint  Patient presents with  . Follow-up    HPI  Victor Rojas is a 73 y.o. male The patient is seen for follow up evaluation of carotid stenosis status post right  carotid endarterectomy on 02/03/2019.  There were no post operative problems or complications related to the surgery.  The patient denies neck or incisional pain.  The patient denies interval amaurosis fugax. There is no recent history of TIA symptoms or focal motor deficits. There is no prior documented CVA.  The patient denies headache.  The patient is taking enteric-coated aspirin 81 mg daily.  The patient has a history of coronary artery disease, no recent episodes of angina or shortness of breath. The patient denies PAD or claudication symptoms. There is a history of hyperlipidemia which is being treated with a statin.   The patient underwent a carotid duplex today which revealed 1 to 39% stenosis bilaterally. Past Medical History:  Diagnosis Date  . Arthritis   . Hiatal hernia   . History of gout   . History of kidney stones   . Hypertension   . Peripheral vascular disease ([place])   . Pre-diabetes     Past Surgical History:  Procedure Laterality Date  . COLONOSCOPY    . COLONOSCOPY WITH PROPOFOL N/A 06/18/2016   Procedure: COLONOSCOPY WITH PROPOFOL;  Surgeon: Lollie Sails, MD;  Location: [facility] ENDOSCOPY;  Service: Endoscopy;  Laterality: N/A;  . ENDARTERECTOMY Right 12/19/2018   Procedure: ENDARTERECTOMY CAROTID;  Surgeon: Katha Cabal, MD;  Location: [facility] ORS;  Service: Vascular;  Laterality: Right;  . ESOPHAGOGASTRODUODENOSCOPY (EGD) WITH PROPOFOL N/A 06/18/2016   Procedure: ESOPHAGOGASTRODUODENOSCOPY (EGD) WITH PROPOFOL;  Surgeon: Lollie Sails, MD;  Location: [facility] ENDOSCOPY;  Service: Endoscopy;  Laterality: N/A;  . HERNIA REPAIR     umbilical  . TONSILLECTOMY      Social History     Socioeconomic History  . Marital status: Married    Spouse name: Not on file  . Number of children: Not on file  . Years of education: Not on file  . Highest education level: Not on file  Occupational History  . Not on file  Social Needs  . Financial resource strain: Not on file  . Food insecurity:    Worry: Not on file    Inability: Not on file  . Transportation needs:    Medical: Not on file    Non-medical: Not on file  Tobacco Use  . Smoking status: Former Smoker    Last attempt to quit: 02/03/1989    Years since quitting: 30.0  . Smokeless tobacco: Never Used  Substance and Sexual Activity  . Alcohol use: Yes    Alcohol/week: 6.0 standard drinks    Types: 6 Cans of beer per week  . Drug use: No  . Sexual activity: Not on file  Lifestyle  . Physical activity:    Days per week: Not on file    Minutes per session: Not on file  . Stress: Not on file  Relationships  . Social connections:    Talks on phone: Not on file    Gets together: Not on file    Attends religious service: Not on file    Active member of club or organization: Not on file    Attends meetings of clubs or organizations: Not on file    Relationship status: Not on  file  . Intimate partner violence:    Fear of current or ex partner: Not on file    Emotionally abused: Not on file    Physically abused: Not on file    Forced sexual activity: Not on file  Other Topics Concern  . Not on file  Social History Narrative  . Not on file    History reviewed. No pertinent family history.  No Known Allergies   Review of Systems   Review of Systems: Negative Unless Checked Constitutional: [] Weight loss  [] Fever  [] Chills Cardiac: [] Chest pain   []  Atrial Fibrillation  [] Palpitations   [] Shortness of breath when laying flat   [] Shortness of breath with exertion. [] Shortness of breath at rest Vascular:  [] Pain in legs with walking   [] Pain in legs with standing [] Pain in legs when laying flat    [] Claudication    [] Pain in feet when laying flat    [] History of DVT   [] Phlebitis   [] Swelling in legs   [] Varicose veins   [] Non-healing ulcers Pulmonary:   [] Uses home oxygen   [] Productive cough   [] Hemoptysis   [] Wheeze  [] COPD   [] Asthma Neurologic:  [] Dizziness   [] Seizures  [] Blackouts [] History of stroke   [] History of TIA  [] Aphasia   [] Temporary Blindness   [] Weakness or numbness in arm   [] Weakness or numbness in leg Musculoskeletal:   [] Joint swelling   [] Joint pain   [] Low back pain  []  History of Knee Replacement [x] Arthritis [] back Surgeries  []  Spinal Stenosis    Hematologic:  [] Easy bruising  [] Easy bleeding   [] Hypercoagulable state   [] Anemic Gastrointestinal:  [] Diarrhea   [] Vomiting  [] Gastroesophageal reflux/heartburn   [] Difficulty swallowing. [] Abdominal pain Genitourinary:  [] Chronic kidney disease   [] Difficult urination  [] Anuric   [] Blood in urine [] Frequent urination  [] Burning with urination   [] Hematuria Skin:  [] Rashes   [] Ulcers [] Wounds Psychological:  [] History of anxiety   []  History of major depression  []  Memory Difficulties      OBJECTIVE:   Physical Exam  BP 114/75 (BP Location: Left Arm, Patient Position: Sitting, Cuff Size: Small)   Pulse 64   Resp 10   Ht 5\' 9"  (1.753 m)   Wt 211 lb (95.7 kg)   BMI 31.16 kg/m   Gen: WD/WN, NAD Head: [place]/AT, No temporalis wasting.  Ear/Nose/Throat: Hearing grossly intact, nares w/o erythema or drainage Eyes: PER, EOMI, sclera nonicteric.  Neck: Supple, no masses.  No JVD.  Pulmonary:  Good air movement, no use of accessory muscles.  Cardiac: RRR Vascular:  Well-healed right carotid incision. Vessel Right Left  Radial Palpable Palpable   Gastrointestinal: soft, non-distended. No guarding/no peritoneal signs.  Musculoskeletal: M/S 5/5 throughout.  No deformity or atrophy.  Neurologic: Pain and light touch intact in extremities.  Symmetrical.  Speech is fluent. Motor exam as listed above. Psychiatric:  Judgment intact, Mood & affect appropriate for pt's clinical situation. Dermatologic: No Venous rashes. No Ulcers Noted.  No changes consistent with cellulitis. Lymph : No Cervical lymphadenopathy, no lichenification or skin changes of chronic lymphedema.       ASSESSMENT AND PLAN:  1. Carotid stenosis, asymptomatic, right Recommend:  The patient is s/p successful right CEA  Duplex ultrasound preoperatively shows 1-39% contralateral stenosis.  Continue antiplatelet therapy as prescribed Continue management of CAD, HTN and Hyperlipidemia Healthy heart diet,  encouraged exercise at least 4 times per week  Follow up in 3 months with duplex ultrasound and physical exam based  on the patient's carotid surgery.  2. Mixed hyperlipidemia Continue statin as ordered and reviewed, no changes at this time   3. Essential hypertension Continue antihypertensive medications as already ordered, these medications have been reviewed and there are no changes at this time.    Current Outpatient Medications on File Prior to Visit  Medication Sig Dispense Refill  . allopurinol (ZYLOPRIM) 300 MG tablet Take 300 mg by mouth daily.    Marland Kitchen aspirin EC 81 MG tablet Take 81 mg by mouth daily.     Marland Kitchen atorvastatin (LIPITOR) 20 MG tablet Take 20 mg by mouth daily.  11  . diphenhydrAMINE (BENADRYL) 25 mg capsule Take 25 mg by mouth daily.    Marland Kitchen lisinopril-hydrochlorothiazide (PRINZIDE,ZESTORETIC) 20-12.5 MG tablet Take 2 tablets by mouth daily.     . metoprolol tartrate (LOPRESSOR) 25 MG tablet Take 25 mg by mouth daily.     . Multiple Vitamin (MULTI-VITAMINS) TABS Take 1 tablet by mouth daily.     Marland Kitchen docusate sodium (COLACE) 100 MG capsule Take 1 capsule (100 mg total) by mouth daily. (Patient not taking: Reported on 02/03/2019) 10 capsule 0   No current facility-administered medications on file prior to visit.     There are no Patient Instructions on file for this visit. No follow-ups on file.   Kris Hartmann, NP  This note was completed with Sales executive.  Any errors are purely unintentional.

## 2019-05-07 ENCOUNTER — Other Ambulatory Visit: Payer: Self-pay

## 2019-05-07 ENCOUNTER — Ambulatory Visit (INDEPENDENT_AMBULATORY_CARE_PROVIDER_SITE_OTHER): Payer: Medicare Other | Admitting: Nurse Practitioner

## 2019-05-07 ENCOUNTER — Ambulatory Visit (INDEPENDENT_AMBULATORY_CARE_PROVIDER_SITE_OTHER): Payer: Medicare Other

## 2019-05-07 ENCOUNTER — Encounter (INDEPENDENT_AMBULATORY_CARE_PROVIDER_SITE_OTHER): Payer: Self-pay | Admitting: Nurse Practitioner

## 2019-05-07 VITALS — BP 154/80 | HR 66 | Resp 12 | Ht 69.0 in | Wt 206.0 lb

## 2019-05-07 DIAGNOSIS — E782 Mixed hyperlipidemia: Secondary | ICD-10-CM

## 2019-05-07 DIAGNOSIS — I6522 Occlusion and stenosis of left carotid artery: Secondary | ICD-10-CM

## 2019-05-07 DIAGNOSIS — I10 Essential (primary) hypertension: Secondary | ICD-10-CM

## 2019-05-07 DIAGNOSIS — Z87891 Personal history of nicotine dependence: Secondary | ICD-10-CM

## 2019-05-07 DIAGNOSIS — I6523 Occlusion and stenosis of bilateral carotid arteries: Secondary | ICD-10-CM

## 2019-05-07 DIAGNOSIS — I6521 Occlusion and stenosis of right carotid artery: Secondary | ICD-10-CM

## 2019-05-07 DIAGNOSIS — Z9889 Other specified postprocedural states: Secondary | ICD-10-CM | POA: Diagnosis not present

## 2019-05-07 DIAGNOSIS — Z7982 Long term (current) use of aspirin: Secondary | ICD-10-CM

## 2019-05-07 DIAGNOSIS — Z8679 Personal history of other diseases of the circulatory system: Secondary | ICD-10-CM

## 2019-05-07 DIAGNOSIS — Z79899 Other long term (current) drug therapy: Secondary | ICD-10-CM

## 2019-05-07 NOTE — Progress Notes (Signed)
SUBJECTIVE:  Patient ID: Victor Rojas, male    DOB: Feb 21, 1946, 73 y.o.   MRN: [id number] Chief Complaint  Patient presents with  . Follow-up    HPI  Victor Rojas is a 73 y.o. male The patient is seen for follow up evaluation of carotid stenosis status post right  carotid endarterectomy on 12/19/2018.  There were no post operative problems or complications related to the surgery.  The patient denies neck or incisional pain.  The patient denies interval amaurosis fugax. There is no recent history of TIA symptoms or focal motor deficits. There is no prior documented CVA.  The patient denies headache.  The patient is taking enteric-coated aspirin 81 mg daily.  The patient has a history of coronary artery disease, no recent episodes of angina or shortness of breath. The patient denies PAD or claudication symptoms. There is a history of hyperlipidemia which is being treated with a statin.   The patient has no evidence of stenosis within the right carotid artery and 1 to 39% stenosis within the left ICA. Past Medical History:  Diagnosis Date  . Arthritis   . Hiatal hernia   . History of gout   . History of kidney stones   . Hypertension   . Peripheral vascular disease ([place])   . Pre-diabetes     Past Surgical History:  Procedure Laterality Date  . COLONOSCOPY    . COLONOSCOPY WITH PROPOFOL N/A 06/18/2016   Procedure: COLONOSCOPY WITH PROPOFOL;  Surgeon: Lollie Sails, MD;  Location: [facility] ENDOSCOPY;  Service: Endoscopy;  Laterality: N/A;  . ENDARTERECTOMY Right 12/19/2018   Procedure: ENDARTERECTOMY CAROTID;  Surgeon: Katha Cabal, MD;  Location: [facility] ORS;  Service: Vascular;  Laterality: Right;  . ESOPHAGOGASTRODUODENOSCOPY (EGD) WITH PROPOFOL N/A 06/18/2016   Procedure: ESOPHAGOGASTRODUODENOSCOPY (EGD) WITH PROPOFOL;  Surgeon: Lollie Sails, MD;  Location: [facility] ENDOSCOPY;  Service: Endoscopy;  Laterality: N/A;  . HERNIA REPAIR     umbilical  . TONSILLECTOMY       Social History   Socioeconomic History  . Marital status: Married    Spouse name: Not on file  . Number of children: Not on file  . Years of education: Not on file  . Highest education level: Not on file  Occupational History  . Not on file  Social Needs  . Financial resource strain: Not on file  . Food insecurity:    Worry: Not on file    Inability: Not on file  . Transportation needs:    Medical: Not on file    Non-medical: Not on file  Tobacco Use  . Smoking status: Former Smoker    Last attempt to quit: 02/03/1989    Years since quitting: 30.2  . Smokeless tobacco: Never Used  Substance and Sexual Activity  . Alcohol use: Yes    Alcohol/week: 6.0 standard drinks    Types: 6 Cans of beer per week  . Drug use: No  . Sexual activity: Not on file  Lifestyle  . Physical activity:    Days per week: Not on file    Minutes per session: Not on file  . Stress: Not on file  Relationships  . Social connections:    Talks on phone: Not on file    Gets together: Not on file    Attends religious service: Not on file    Active member of club or organization: Not on file    Attends meetings of clubs or organizations: Not on file  Relationship status: Not on file  . Intimate partner violence:    Fear of current or ex partner: Not on file    Emotionally abused: Not on file    Physically abused: Not on file    Forced sexual activity: Not on file  Other Topics Concern  . Not on file  Social History Narrative  . Not on file    History reviewed. No pertinent family history.  No Known Allergies   Review of Systems   Review of Systems: Negative Unless Checked Constitutional: [] Weight loss  [] Fever  [] Chills Cardiac: [] Chest pain   []  Atrial Fibrillation  [] Palpitations   [] Shortness of breath when laying flat   [] Shortness of breath with exertion. [] Shortness of breath at rest Vascular:  [] Pain in legs with walking   [] Pain in legs with standing [] Pain in legs when  laying flat   [] Claudication    [] Pain in feet when laying flat    [] History of DVT   [] Phlebitis   [] Swelling in legs   [] Varicose veins   [] Non-healing ulcers Pulmonary:   [] Uses home oxygen   [] Productive cough   [] Hemoptysis   [] Wheeze  [] COPD   [] Asthma Neurologic:  [] Dizziness   [] Seizures  [] Blackouts [] History of stroke   [] History of TIA  [] Aphasia   [] Temporary Blindness   [] Weakness or numbness in arm   [] Weakness or numbness in leg Musculoskeletal:   [] Joint swelling   [] Joint pain   [] Low back pain  []  History of Knee Replacement [] Arthritis [] back Surgeries  []  Spinal Stenosis    Hematologic:  [] Easy bruising  [] Easy bleeding   [] Hypercoagulable state   [] Anemic Gastrointestinal:  [] Diarrhea   [] Vomiting  [] Gastroesophageal reflux/heartburn   [] Difficulty swallowing. [] Abdominal pain Genitourinary:  [] Chronic kidney disease   [] Difficult urination  [] Anuric   [] Blood in urine [] Frequent urination  [] Burning with urination   [] Hematuria Skin:  [] Rashes   [] Ulcers [] Wounds Psychological:  [] History of anxiety   []  History of major depression  []  Memory Difficulties      OBJECTIVE:   Physical Exam  BP (!) 154/80 (BP Location: Left Arm, Patient Position: Sitting, Cuff Size: Small)   Pulse 66   Resp 12   Ht 5\' 9"  (1.753 m)   Wt 206 lb (93.4 kg)   BMI 30.42 kg/m   Gen: WD/WN, NAD Head: [place]/AT, No temporalis wasting.  Ear/Nose/Throat: Hearing grossly intact, nares w/o erythema or drainage Eyes: PER, EOMI, sclera nonicteric.  Neck: Supple, no masses.  No JVD.  Pulmonary:  Good air movement, no use of accessory muscles.  Cardiac: RRR Vascular:  Vessel Right Left  Radial Palpable Palpable  Brachial Palpable Palpable  Femoral Palpable Palpable  Popliteal Palpable Palpable  Dorsalis Pedis Palpable Palpable  Posterior Tibial Palpable Palpable   Gastrointestinal: soft, non-distended. No guarding/no peritoneal signs.  Musculoskeletal: M/S 5/5 throughout.  No deformity or  atrophy.  Neurologic: Pain and light touch intact in extremities.  Symmetrical.  Speech is fluent. Motor exam as listed above. Psychiatric: Judgment intact, Mood & affect appropriate for pt's clinical situation. Dermatologic: No Venous rashes. No Ulcers Noted.  No changes consistent with cellulitis. Lymph : No Cervical lymphadenopathy, no lichenification or skin changes of chronic lymphedema.       ASSESSMENT AND PLAN:  1. Carotid stenosis, asymptomatic, right  The patient has no evidence of stenosis within the right carotid artery and 1 to 39% stenosis within the left ICA.  Recommend:  The patient is s/p successful right CEA  Continue antiplatelet therapy as prescribed Continue management of CAD, HTN and Hyperlipidemia Healthy heart diet,  encouraged exercise at least 4 times per week  Follow up in 6 months with duplex ultrasound and physical exam based on the patient's carotid surgery  - VAS US CAROTID; Future  2. Essential hypertension Continue antihypertensive medications as already ordered, these medications have been reviewed and there are no changes at this time.   3. Mixed hyperlipidemia Continue statin as ordered and reviewed, no changes at this time    Current Outpatient Medications on File Prior to Visit  Medication Sig Dispense Refill  . allopurinol (ZYLOPRIM) 300 MG tablet Take 300 mg by mouth daily.    Marland Kitchen aspirin EC 81 MG tablet Take 81 mg by mouth daily.     Marland Kitchen atorvastatin (LIPITOR) 20 MG tablet Take 20 mg by mouth daily.  11  . diphenhydrAMINE (BENADRYL) 25 mg capsule Take 25 mg by mouth daily.    Marland Kitchen lisinopril-hydrochlorothiazide (PRINZIDE,ZESTORETIC) 20-12.5 MG tablet Take 2 tablets by mouth daily.     . metoprolol tartrate (LOPRESSOR) 25 MG tablet Take 25 mg by mouth daily.     . Multiple Vitamin (MULTI-VITAMINS) TABS Take 1 tablet by mouth daily.     Marland Kitchen docusate sodium (COLACE) 100 MG capsule Take 1 capsule (100 mg total) by mouth daily. (Patient not  taking: Reported on 02/03/2019) 10 capsule 0   No current facility-administered medications on file prior to visit.     There are no Patient Instructions on file for this visit. Return in about 6 months (around 11/07/2019) for Carotid stenosis.   Kris Hartmann, NP  This note was completed with Sales executive.  Any errors are purely unintentional.

## 2019-09-08 ENCOUNTER — Other Ambulatory Visit: Payer: Self-pay

## 2019-09-08 ENCOUNTER — Ambulatory Visit (INDEPENDENT_AMBULATORY_CARE_PROVIDER_SITE_OTHER): Payer: Medicare Other | Admitting: Nurse Practitioner

## 2019-09-08 ENCOUNTER — Encounter (INDEPENDENT_AMBULATORY_CARE_PROVIDER_SITE_OTHER): Payer: Self-pay | Admitting: Nurse Practitioner

## 2019-09-08 VITALS — BP 147/73 | HR 70 | Resp 10 | Ht 69.0 in | Wt 207.0 lb

## 2019-09-08 DIAGNOSIS — I6523 Occlusion and stenosis of bilateral carotid arteries: Secondary | ICD-10-CM

## 2019-09-08 DIAGNOSIS — I739 Peripheral vascular disease, unspecified: Secondary | ICD-10-CM | POA: Diagnosis not present

## 2019-09-08 DIAGNOSIS — E782 Mixed hyperlipidemia: Secondary | ICD-10-CM

## 2019-09-09 ENCOUNTER — Encounter (INDEPENDENT_AMBULATORY_CARE_PROVIDER_SITE_OTHER): Payer: Self-pay | Admitting: Nurse Practitioner

## 2019-09-09 ENCOUNTER — Encounter (INDEPENDENT_AMBULATORY_CARE_PROVIDER_SITE_OTHER): Payer: Self-pay

## 2019-09-09 NOTE — Progress Notes (Signed)
SUBJECTIVE:  Patient ID: Victor Rojas, male    DOB: Sep 13, 1946, 73 y.o.   MRN: [id number] Chief Complaint  Patient presents with  . Follow-up    HPI  Victor Rojas is a 73 y.o. male that is a current patient of our practice that we most recently performed a carotid endarterectomy on.  However he is referred back by his primary care physician due to pain in his lower extremities.  Test done by his primary care physician reveal an ABI 0.59 on the right lower extremity and 0.70 on the left.  The patient has biphasic waveforms within the bilateral tibial arteries.  The patient endorses his biggest issue being numbness in the right lower extremity.  He endorses that it only happens sometimes but a lot of times when he is driving or sitting still.  He also endorses having issues with his hips when he has to walk long distances.  He states that the feel like they are getting weak and he cannot go any further which causes him the need to stop.  However he denies that any of these issues because any hindrance to his normal everyday life.  He denies any fever, chills, nausea, vomiting or diarrhea.  He denies any rest pain like symptoms.  He denies any ulceration of his bilateral lower extremities.  Past Medical History:  Diagnosis Date  . Arthritis   . Hiatal hernia   . History of gout   . History of kidney stones   . Hypertension   . Peripheral vascular disease ([place])   . Pre-diabetes     Past Surgical History:  Procedure Laterality Date  . COLONOSCOPY    . COLONOSCOPY WITH PROPOFOL N/A 06/18/2016   Procedure: COLONOSCOPY WITH PROPOFOL;  Surgeon: Lollie Sails, MD;  Location: [facility] ENDOSCOPY;  Service: Endoscopy;  Laterality: N/A;  . ENDARTERECTOMY Right 12/19/2018   Procedure: ENDARTERECTOMY CAROTID;  Surgeon: Katha Cabal, MD;  Location: [facility] ORS;  Service: Vascular;  Laterality: Right;  . ESOPHAGOGASTRODUODENOSCOPY (EGD) WITH PROPOFOL N/A 06/18/2016   Procedure:  ESOPHAGOGASTRODUODENOSCOPY (EGD) WITH PROPOFOL;  Surgeon: Lollie Sails, MD;  Location: [facility] ENDOSCOPY;  Service: Endoscopy;  Laterality: N/A;  . HERNIA REPAIR     umbilical  . TONSILLECTOMY      Social History   Socioeconomic History  . Marital status: Married    Spouse name: Not on file  . Number of children: Not on file  . Years of education: Not on file  . Highest education level: Not on file  Occupational History  . Not on file  Social Needs  . Financial resource strain: Not on file  . Food insecurity    Worry: Not on file    Inability: Not on file  . Transportation needs    Medical: Not on file    Non-medical: Not on file  Tobacco Use  . Smoking status: Former Smoker    Quit date: 02/03/1989    Years since quitting: 30.6  . Smokeless tobacco: Never Used  Substance and Sexual Activity  . Alcohol use: Yes    Alcohol/week: 6.0 standard drinks    Types: 6 Cans of beer per week  . Drug use: No  . Sexual activity: Not on file  Lifestyle  . Physical activity    Days per week: Not on file    Minutes per session: Not on file  . Stress: Not on file  Relationships  . Social Herbalist on phone: Not  on file    Gets together: Not on file    Attends religious service: Not on file    Active member of club or organization: Not on file    Attends meetings of clubs or organizations: Not on file    Relationship status: Not on file  . Intimate partner violence    Fear of current or ex partner: Not on file    Emotionally abused: Not on file    Physically abused: Not on file    Forced sexual activity: Not on file  Other Topics Concern  . Not on file  Social History Narrative  . Not on file    History reviewed. No pertinent family history.  No Known Allergies   Review of Systems   Review of Systems: Negative Unless Checked Constitutional: [] Weight loss  [] Fever  [] Chills Cardiac: [] Chest pain   []  Atrial Fibrillation  [] Palpitations   [] Shortness of  breath when laying flat   [] Shortness of breath with exertion. [] Shortness of breath at rest Vascular:  [x] Pain in legs with walking   [x] Pain in legs with standing [] Pain in legs when laying flat   [x] Claudication    [] Pain in feet when laying flat    [] History of DVT   [] Phlebitis   [] Swelling in legs   [] Varicose veins   [] Non-healing ulcers Pulmonary:   [] Uses home oxygen   [] Productive cough   [] Hemoptysis   [] Wheeze  [] COPD   [] Asthma Neurologic:  [] Dizziness   [] Seizures  [] Blackouts [] History of stroke   [] History of TIA  [] Aphasia   [] Temporary Blindness   [] Weakness or numbness in arm   [x] Weakness or numbness in leg Musculoskeletal:   [] Joint swelling   [] Joint pain   [] Low back pain  []  History of Knee Replacement [x] Arthritis [] back Surgeries  []  Spinal Stenosis    Hematologic:  [] Easy bruising  [] Easy bleeding   [] Hypercoagulable state   [] Anemic Gastrointestinal:  [] Diarrhea   [] Vomiting  [] Gastroesophageal reflux/heartburn   [] Difficulty swallowing. [] Abdominal pain Genitourinary:  [] Chronic kidney disease   [] Difficult urination  [] Anuric   [] Blood in urine [] Frequent urination  [] Burning with urination   [] Hematuria Skin:  [] Rashes   [] Ulcers [] Wounds Psychological:  [] History of anxiety   []  History of major depression  []  Memory Difficulties      OBJECTIVE:   Physical Exam  BP (!) 147/73 (BP Location: Left Arm, Patient Position: Sitting, Cuff Size: Normal)   Pulse 70   Resp 10   Ht 5\' 9"  (1.753 m)   Wt 207 lb (93.9 kg)   BMI 30.57 kg/m   Gen: WD/WN, NAD Head: [place]/AT, No temporalis wasting.  Ear/Nose/Throat: Hearing grossly intact, nares w/o erythema or drainage Eyes: PER, EOMI, sclera nonicteric.  Neck: Supple, no masses.  No JVD.  Pulmonary:  Good air movement, no use of accessory muscles.  Cardiac: RRR Vascular:  Vessel Right Left  Radial Palpable Palpable  Dorsalis Pedis Not Palpable Not Palpable  Posterior Tibial Not Palpable Not Palpable    Gastrointestinal: soft, non-distended. No guarding/no peritoneal signs.  Musculoskeletal: M/S 5/5 throughout.  No deformity or atrophy.  Neurologic: Pain and light touch intact in extremities.  Symmetrical.  Speech is fluent. Motor exam as listed above. Psychiatric: Judgment intact, Mood & affect appropriate for pt's clinical situation. Dermatologic: No Venous rashes. No Ulcers Noted.  No changes consistent with cellulitis. Lymph : No Cervical lymphadenopathy, no lichenification or skin changes of chronic lymphedema.       ASSESSMENT AND PLAN:  1. Bilateral carotid artery stenosis Patient continues to do well after right carotid endarterectomy.  Patient has upcoming appointment for follow-up carotid duplex.  2. Hyperlipidemia, mixed Continue statin as ordered and reviewed, no changes at this time   3. PAD (peripheral artery disease) (HCC)  Recommend:  The patient has evidence of atherosclerosis of the lower extremities with claudication.  The patient does not voice lifestyle limiting changes at this point in time.  Noninvasive studies do not suggest clinically significant change.  No invasive studies, angiography or surgery at this time The patient should continue walking and begin a more formal exercise program.  The patient should continue antiplatelet therapy and aggressive treatment of the lipid abnormalities  No changes in the patient's medications at this time  The patient should continue wearing graduated compression socks 10-15 mmHg strength to control the mild edema.   We will have the patient return with aortoiliac duplex in addition to the carotid duplex to further assess his PAD.  At this time the patient will continue with conservative treatment measures per his decision   Current Outpatient Medications on File Prior to Visit  Medication Sig Dispense Refill  . allopurinol (ZYLOPRIM) 300 MG tablet Take 300 mg by mouth daily.    Marland Kitchen aspirin EC 81 MG tablet Take 81 mg  by mouth daily.     Marland Kitchen atorvastatin (LIPITOR) 20 MG tablet Take 20 mg by mouth daily.  11  . diphenhydrAMINE (BENADRYL) 25 mg capsule Take 25 mg by mouth daily.    Marland Kitchen lisinopril-hydrochlorothiazide (PRINZIDE,ZESTORETIC) 20-12.5 MG tablet Take 2 tablets by mouth daily.     . metoprolol tartrate (LOPRESSOR) 25 MG tablet Take 25 mg by mouth daily.     . Multiple Vitamin (MULTI-VITAMINS) TABS Take 1 tablet by mouth daily.      No current facility-administered medications on file prior to visit.     There are no Patient Instructions on file for this visit. No follow-ups on file.   Kris Hartmann, NP  This note was completed with Sales executive.  Any errors are purely unintentional.

## 2019-10-30 ENCOUNTER — Other Ambulatory Visit (INDEPENDENT_AMBULATORY_CARE_PROVIDER_SITE_OTHER): Payer: Self-pay | Admitting: Nurse Practitioner

## 2019-10-30 DIAGNOSIS — I739 Peripheral vascular disease, unspecified: Secondary | ICD-10-CM

## 2019-11-08 NOTE — Progress Notes (Signed)
MRN : [id number]  Victor Rojas is a 73 y.o. (04-May-1946) male who presents with chief complaint of No chief complaint on file. Marland Kitchen  History of Present Illness:   The patient returns to the office for followup and review of the noninvasive studies. There has been a significant deterioration in the lower extremity symptoms.  The patient notes interval shortening of their claudication distance and development of mild rest pain symptoms. No new ulcers or wounds have occurred since the last visit.  There have been no significant changes to the patient's overall health care.  The patient is also seen for follow up evaluation of carotid stenosis. The carotid stenosis followed by ultrasound.   The patient denies amaurosis fugax. There is no recent history of TIA symptoms or focal motor deficits. There is no prior documented CVA.  The patient is taking enteric-coated aspirin 81 mg daily.  There is no history of migraine headaches. There is no history of seizures.  The patient has a history of coronary artery disease, no recent episodes of angina or shortness of breath. The patient denies PAD or claudication symptoms. There is a history of hyperlipidemia which is being treated with a statin.    Carotid Duplex done today shows widely patent right CEA with [id number] LICA.    ABI's Rt=0.75 and Lt=0.81  Duplex US of the aorta-iliac arterial system shows high grade iliac disease bilaterally  No outpatient medications have been marked as taking for the 11/09/19 encounter (Appointment) with Delana Meyer, Dolores Lory, MD.    Past Medical History:  Diagnosis Date  . Arthritis   . Hiatal hernia   . History of gout   . History of kidney stones   . Hypertension   . Peripheral vascular disease ([place])   . Pre-diabetes     Past Surgical History:  Procedure Laterality Date  . COLONOSCOPY    . COLONOSCOPY WITH PROPOFOL N/A 06/18/2016   Procedure: COLONOSCOPY WITH PROPOFOL;  Surgeon: Lollie Sails, MD;   Location[facility] ENDOSCOPY;  Service: Endoscopy;  Laterality: N/A;  . ENDARTERECTOMY Right 12/19/2018   Procedure: ENDARTERECTOMY CAROTID;  Surgeon: Katha Cabal, MD;  Location: [facility] ORS;  Service: Vascular;  Laterality: Right;  . ESOPHAGOGASTRODUODENOSCOPY (EGD) WITH PROPOFOL N/A 06/18/2016   Procedure: ESOPHAGOGASTRODUODENOSCOPY (EGD) WITH PROPOFOL;  Surgeon: Lollie Sails, MD;  Location: [facility] ENDOSCOPY;  Service: Endoscopy;  Laterality: N/A;  . HERNIA REPAIR     umbilical  . TONSILLECTOMY      Social History Social History   Tobacco Use  . Smoking status: Former Smoker    Quit date: 02/03/1989    Years since quitting: 30.7  . Smokeless tobacco: Never Used  Substance Use Topics  . Alcohol use: Yes    Alcohol/week: 6.0 standard drinks    Types: 6 Cans of beer per week  . Drug use: No    Family History No family history on file.  No Known Allergies   REVIEW OF SYSTEMS (Negative unless checked)  Constitutional: [] Weight loss  [] Fever  [] Chills Cardiac: [] Chest pain   [] Chest pressure   [] Palpitations   [] Shortness of breath when laying flat   [] Shortness of breath with exertion. Vascular:  [x] Pain in legs with walking   [] Pain in legs at rest  [] History of DVT   [] Phlebitis   [] Swelling in legs   [] Varicose veins   [] Non-healing ulcers Pulmonary:   [] Uses home oxygen   [] Productive cough   [] Hemoptysis   [] Wheeze  [] COPD   []   Asthma Neurologic:  [] Dizziness   [] Seizures   [] History of stroke   [] History of TIA  [] Aphasia   [] Vissual changes   [] Weakness or numbness in arm   [] Weakness or numbness in leg Musculoskeletal:   [] Joint swelling   [x] Joint pain   [] Low back pain Hematologic:  [] Easy bruising  [] Easy bleeding   [] Hypercoagulable state   [] Anemic Gastrointestinal:  [] Diarrhea   [] Vomiting  [] Gastroesophageal reflux/heartburn   [] Difficulty swallowing. Genitourinary:  [] Chronic kidney disease   [] Difficult urination  [] Frequent urination   [] Blood in urine Skin:   [] Rashes   [] Ulcers  Psychological:  [] History of anxiety   []  History of major depression.  Physical Examination  There were no vitals filed for this visit. There is no height or weight on file to calculate BMI. Gen: WD/WN, NAD Head: [place]/AT, No temporalis wasting.  Ear/Nose/Throat: Hearing grossly intact, nares w/o erythema or drainage Eyes: PER, EOMI, sclera nonicteric.  Neck: Supple, no large masses.   Pulmonary:  Good air movement, no audible wheezing bilaterally, no use of accessory muscles.  Cardiac: RRR, no JVD Vascular:  Vessel Right Left  Radial Palpable Palpable  PT Not Palpable Not Palpable  DP Not Palpable Not Palpable  Gastrointestinal: Non-distended. No guarding/no peritoneal signs.  Musculoskeletal: M/S 5/5 throughout.  No deformity or atrophy.  Neurologic: CN 2-12 intact. Symmetrical.  Speech is fluent. Motor exam as listed above. Psychiatric: Judgment intact, Mood & affect appropriate for pt's clinical situation. Dermatologic: No rashes or ulcers noted.  No changes consistent with cellulitis. Lymph : No lichenification or skin changes of chronic lymphedema.  CBC Lab Results  Component Value Date   WBC 10.5 12/20/2018   HGB 14.0 12/20/2018   HCT 42.4 12/20/2018   MCV 107.9 (H) 12/20/2018   PLT 116 (L) 12/20/2018    BMET    Component Value Date/Time   NA 140 12/20/2018 0429   K 4.5 12/20/2018 0429   CL 106 12/20/2018 0429   CO2 24 12/20/2018 0429   GLUCOSE 164 (H) 12/20/2018 0429   BUN 25 (H) 12/20/2018 0429   CREATININE 1.00 12/20/2018 0429   CALCIUM 8.5 (L) 12/20/2018 0429   GFRNONAA >60 12/20/2018 0429   GFRAA >60 12/20/2018 0429   CrCl cannot be calculated (Patient's most recent lab result is older than the maximum 21 days allowed.).  COAG Lab Results  Component Value Date   INR 1.08 12/15/2018    Radiology No results found.   Assessment/Plan 1. Bilateral carotid artery stenosis Recommend:  The patient is s/p successful right CEA   Duplex ultrasound preoperatively shows 1-39%  contralateral stenosis.  Continue antiplatelet therapy as prescribed Continue management of CAD, HTN and Hyperlipidemia Healthy heart diet,  encouraged exercise at least 4 times per week  Follow up in 12 months with duplex ultrasound and physical exam based on the patient's carotid surgery and <50% stenosis of the left carotid artery    2. PAD (peripheral artery disease) (HCC)  Recommend:  The patient has evidence of atherosclerosis of the lower extremities with claudication.  The patient does not voice lifestyle limiting changes at this point in time.  Noninvasive studies do not suggest clinically significant change.  No invasive studies, angiography or surgery at this time The patient should continue walking and begin a more formal exercise program.  The patient should continue antiplatelet therapy and aggressive treatment of the lipid abnormalities  No changes in the patient's medications at this time  The patient should continue wearing graduated compression  socks 10-15 mmHg strength to control the mild edema.   - ABI; Future  3. Essential hypertension Continue antihypertensive medications as already ordered, these medications have been reviewed and there are no changes at this time.   4. Hyperlipidemia, mixed Continue statin as ordered and reviewed, no changes at this time   5. Uncontrolled type 2 diabetes mellitus with hyperglycemia ([place]) Continue hypoglycemic medications as already ordered, these medications have been reviewed and there are no changes at this time.  Hgb A1C to be monitored as already arranged by primary service    [name], MD  11/08/2019 4:03 PM

## 2019-11-09 ENCOUNTER — Ambulatory Visit (INDEPENDENT_AMBULATORY_CARE_PROVIDER_SITE_OTHER): Payer: Medicare Other

## 2019-11-09 ENCOUNTER — Ambulatory Visit (INDEPENDENT_AMBULATORY_CARE_PROVIDER_SITE_OTHER): Payer: Medicare Other | Admitting: Vascular Surgery

## 2019-11-09 ENCOUNTER — Encounter (INDEPENDENT_AMBULATORY_CARE_PROVIDER_SITE_OTHER): Payer: Self-pay | Admitting: Vascular Surgery

## 2019-11-09 ENCOUNTER — Other Ambulatory Visit: Payer: Self-pay

## 2019-11-09 VITALS — BP 126/74 | HR 69 | Resp 16 | Wt 206.8 lb

## 2019-11-09 DIAGNOSIS — I6523 Occlusion and stenosis of bilateral carotid arteries: Secondary | ICD-10-CM

## 2019-11-09 DIAGNOSIS — I6521 Occlusion and stenosis of right carotid artery: Secondary | ICD-10-CM | POA: Diagnosis not present

## 2019-11-09 DIAGNOSIS — I739 Peripheral vascular disease, unspecified: Secondary | ICD-10-CM

## 2019-11-09 DIAGNOSIS — I10 Essential (primary) hypertension: Secondary | ICD-10-CM

## 2019-11-09 DIAGNOSIS — E1165 Type 2 diabetes mellitus with hyperglycemia: Secondary | ICD-10-CM

## 2019-11-09 DIAGNOSIS — E782 Mixed hyperlipidemia: Secondary | ICD-10-CM | POA: Diagnosis not present

## 2019-11-11 DIAGNOSIS — I739 Peripheral vascular disease, unspecified: Secondary | ICD-10-CM | POA: Insufficient documentation

## 2020-03-21 ENCOUNTER — Telehealth (INDEPENDENT_AMBULATORY_CARE_PROVIDER_SITE_OTHER): Payer: Self-pay | Admitting: Vascular Surgery

## 2020-03-21 NOTE — Telephone Encounter (Signed)
Called stating that legs are numb and feet are cold, has been for the past few weeks. Wanting to come in to been seen earlier than appt May 12, 2020.

## 2020-03-21 NOTE — Telephone Encounter (Signed)
Please advise 

## 2020-03-21 NOTE — Telephone Encounter (Signed)
Please schedule the pt per NP Fallon's request.

## 2020-03-21 NOTE — Telephone Encounter (Signed)
That is fine, he can come in sooner with the scheduled studies.

## 2020-03-25 ENCOUNTER — Encounter (INDEPENDENT_AMBULATORY_CARE_PROVIDER_SITE_OTHER): Payer: Self-pay | Admitting: Nurse Practitioner

## 2020-03-25 ENCOUNTER — Ambulatory Visit (INDEPENDENT_AMBULATORY_CARE_PROVIDER_SITE_OTHER): Payer: Medicare Other | Admitting: Nurse Practitioner

## 2020-03-25 ENCOUNTER — Other Ambulatory Visit: Payer: Self-pay

## 2020-03-25 ENCOUNTER — Ambulatory Visit (INDEPENDENT_AMBULATORY_CARE_PROVIDER_SITE_OTHER): Payer: Medicare Other

## 2020-03-25 VITALS — BP 123/67 | HR 68 | Resp 16 | Wt 211.0 lb

## 2020-03-25 DIAGNOSIS — E1165 Type 2 diabetes mellitus with hyperglycemia: Secondary | ICD-10-CM

## 2020-03-25 DIAGNOSIS — I739 Peripheral vascular disease, unspecified: Secondary | ICD-10-CM

## 2020-03-25 DIAGNOSIS — I10 Essential (primary) hypertension: Secondary | ICD-10-CM | POA: Diagnosis not present

## 2020-03-28 ENCOUNTER — Encounter (INDEPENDENT_AMBULATORY_CARE_PROVIDER_SITE_OTHER): Payer: Self-pay | Admitting: Nurse Practitioner

## 2020-03-28 NOTE — Progress Notes (Signed)
Subjective:    Patient ID: Victor Rojas, male    DOB: 1946-09-19, 74 y.o.   MRN: [id number] No chief complaint on file.   Chest the patient presents for follow-up noninvasive studies due to the fact that he has been having a cold sensation in his lower extremities at night.  The patient does have a known history of PAD in addition to having carotid artery stenosis that was repaired via carotid endarterectomy several months ago.  The patient states that the coolness and numbness of his toes is worse at night when he is elevating them.  He denies any significant claudication or rest pain like symptoms however.  He denies any lower extremity ulcerations or wounds.  Today noninvasive studies show a right ABI 0.62 previously 0.75.  A left ABI of 0.79 with a previous ABI of 0.81.  The patient has monophasic tibial artery waveforms in the bilateral lower extremities.  The right digit is slightly more dampened than the left.  Previous arterial duplexes revealed that the patient had a greater than 50% stenosis in the bilateral iliac arteries.   Review of Systems  Endocrine: Positive for cold intolerance.  Neurological: Positive for numbness.  All other systems reviewed and are negative.      Objective:   Physical Exam Vitals reviewed.  Constitutional:      Appearance: Normal appearance.  HENT:     Head: Normocephalic.  Cardiovascular:     Rate and Rhythm: Normal rate and regular rhythm.     Pulses:          Dorsalis pedis pulses are 0 on the right side and 1+ on the left side.       Posterior tibial pulses are 0 on the right side and 1+ on the left side.  Pulmonary:     Effort: Pulmonary effort is normal.  Skin:    Comments: Cool near toes  Neurological:     Mental Status: He is alert.  Psychiatric:        Mood and Affect: Mood normal.        Behavior: Behavior normal.        Thought Content: Thought content normal.        Judgment: Judgment normal.     BP 123/67 (BP  Location: Right Arm)   Pulse 68   Resp 16   Wt 211 lb (95.7 kg)   BMI 31.16 kg/m   Past Medical History:  Diagnosis Date  . Arthritis   . Hiatal hernia   . History of gout   . History of kidney stones   . Hypertension   . Peripheral vascular disease ([place])   . Pre-diabetes     Social History   Socioeconomic History  . Marital status: Married    Spouse name: Not on file  . Number of children: Not on file  . Years of education: Not on file  . Highest education level: Not on file  Occupational History  . Not on file  Tobacco Use  . Smoking status: Former Smoker    Quit date: 02/03/1989    Years since quitting: 31.1  . Smokeless tobacco: Never Used  Substance and Sexual Activity  . Alcohol use: Yes    Alcohol/week: 6.0 standard drinks    Types: 6 Cans of beer per week  . Drug use: No  . Sexual activity: Not on file  Other Topics Concern  . Not on file  Social History Narrative  . Not on file  Social Determinants of Health   Financial Resource Strain:   . Difficulty of Paying Living Expenses:   Food Insecurity:   . Worried About Charity fundraiser in the Last Year:   . Arboriculturist in the Last Year:   Transportation Needs:   . Film/video editor (Medical):   Marland Kitchen Lack of Transportation (Non-Medical):   Physical Activity:   . Days of Exercise per Week:   . Minutes of Exercise per Session:   Stress:   . Feeling of Stress :   Social Connections:   . Frequency of Communication with Friends and Family:   . Frequency of Social Gatherings with Friends and Family:   . Attends Religious Services:   . Active Member of Clubs or Organizations:   . Attends Archivist Meetings:   Marland Kitchen Marital Status:   Intimate Partner Violence:   . Fear of Current or Ex-Partner:   . Emotionally Abused:   Marland Kitchen Physically Abused:   . Sexually Abused:     Past Surgical History:  Procedure Laterality Date  . COLONOSCOPY    . COLONOSCOPY WITH PROPOFOL N/A 06/18/2016    Procedure: COLONOSCOPY WITH PROPOFOL;  Surgeon: Lollie Sails, MD;  Location: [facility] ENDOSCOPY;  Service: Endoscopy;  Laterality: N/A;  . ENDARTERECTOMY Right 12/19/2018   Procedure: ENDARTERECTOMY CAROTID;  Surgeon: Katha Cabal, MD;  Location: [facility] ORS;  Service: Vascular;  Laterality: Right;  . ESOPHAGOGASTRODUODENOSCOPY (EGD) WITH PROPOFOL N/A 06/18/2016   Procedure: ESOPHAGOGASTRODUODENOSCOPY (EGD) WITH PROPOFOL;  Surgeon: Lollie Sails, MD;  [facility] ENDOSCOPY;  Service: Endoscopy;  Laterality: N/A;  . HERNIA REPAIR     umbilical  . TONSILLECTOMY      Family History  Problem Relation Age of Onset  . Cataracts Mother   . Heart attack Mother   . Cancer Sister     No Known Allergies     Assessment & Plan:    1. PAD (peripheral artery disease) ([place]) I had a long discussion with the patient regarding his symptoms as well as options.  Based upon the patient's noninvasive studies it is likely that his symptoms are due to worsening of his PAD.  However the patient does not have any ischemic or limb threatening symptoms at this time.  They are concerning that they may worsen over time.  Had a discussion about what that may mean for the patient in terms of worsening of his PAD.  We discussed the possibility of this turning into an emergent limb threatening situation.  However the patient still would like to hold off on any intervention at this time.  Instead we will have the patient return to the office in 6 months with noninvasive studies.  Patient is advised to proceed to our office sooner if the coldness in his legs becomes more persistent, if he has discoloration in his lower extremities or if he begins having worsening pain in his lower extremities. - ABI - VAS US AORTA/IVC/ILIACS; Future - VAS [place] ABI WITH/WO TBI; Future  2. Essential hypertension Continue antihypertensive medications as already ordered, these medications have been reviewed and there are no changes at  this time.   3. Uncontrolled type 2 diabetes mellitus with hyperglycemia ([place]) Continue hypoglycemic medications as already ordered, these medications have been reviewed and there are no changes at this time.  Hgb A1C to be monitored as already arranged by primary service    Current Outpatient Medications on File Prior to Visit  Medication Sig Dispense  Refill  . allopurinol (ZYLOPRIM) 300 MG tablet Take 300 mg by mouth daily.    Marland Kitchen aspirin EC 81 MG tablet Take 81 mg by mouth daily.     Marland Kitchen atorvastatin (LIPITOR) 20 MG tablet Take 20 mg by mouth daily.  11  . diphenhydrAMINE (BENADRYL) 25 mg capsule Take 25 mg by mouth daily.    Marland Kitchen lisinopril-hydrochlorothiazide (PRINZIDE,ZESTORETIC) 20-12.5 MG tablet Take 2 tablets by mouth daily.     . metoprolol tartrate (LOPRESSOR) 25 MG tablet Take 25 mg by mouth daily.     . Multiple Vitamin (MULTI-VITAMINS) TABS Take 1 tablet by mouth daily.      No current facility-administered medications on file prior to visit.    There are no Patient Instructions on file for this visit. No follow-ups on file.   Kris Hartmann, NP

## 2020-05-12 ENCOUNTER — Encounter (INDEPENDENT_AMBULATORY_CARE_PROVIDER_SITE_OTHER): Payer: Medicare Other

## 2020-05-12 ENCOUNTER — Ambulatory Visit (INDEPENDENT_AMBULATORY_CARE_PROVIDER_SITE_OTHER): Payer: Medicare Other | Admitting: Vascular Surgery

## 2020-05-27 IMAGING — CT CT ANGIO HEAD
1 of 12 series · 4 of 33 positions shown · IV contrast (iopamidol)
Comparison: None.

Addendum:
CLINICAL DATA: 72-year-old male with high-grade right carotid
stenosis by ultrasound earlier this month.

EXAM:
CT ANGIOGRAPHY HEAD AND NECK
TECHNIQUE: Multidetector CT imaging of the head and neck was performed using
the standard protocol during bolus administration of intravenous
contrast. Multiplanar CT image reconstructions and MIPs were
obtained to evaluate the vascular anatomy. Carotid stenosis
measurements (when applicable) are obtained utilizing NASCET
criteria, using the distal internal carotid diameter as the
denominator.
CONTRAST:  75mL U9R9W1-11P IOPAMIDOL (U9R9W1-11P) INJECTION 76%

[Series 17: ax thin mips cta head & neck · axial · 0.68mm/px · z∈[-766,-553]mm · 4 of 355 slices shown]
[im 71/355  soft-tissue]
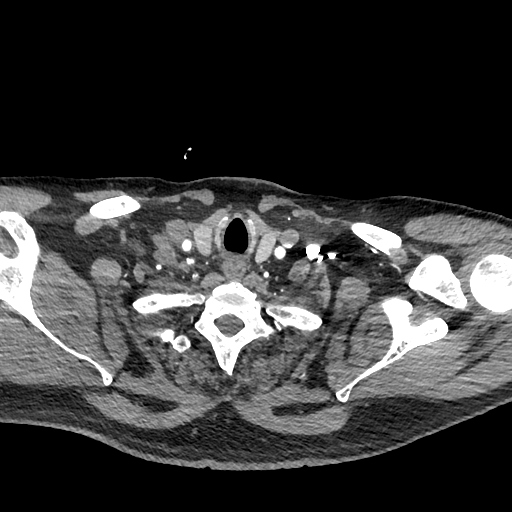
[im 142/355  bone]
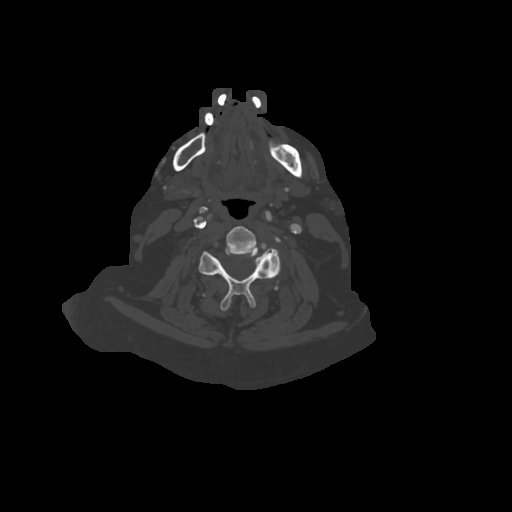
[im 213/355  soft-tissue]
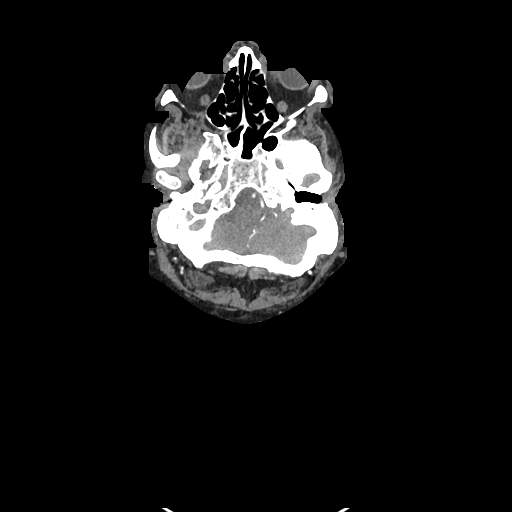
[im 284/355  bone]
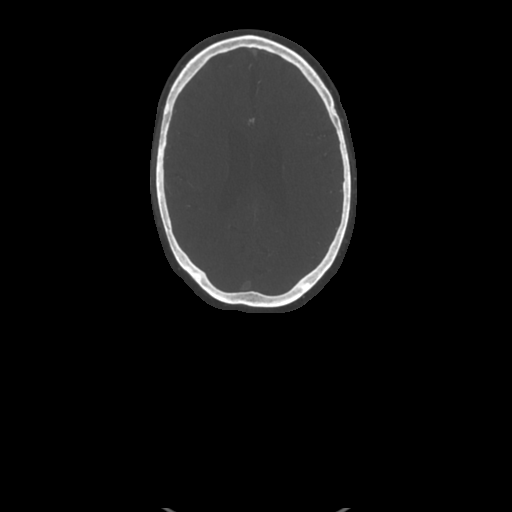

[4 of 33 positions shown; findings below may reference images not displayed]

FINDINGS: CT HEAD

Brain: Cerebral volume is within normal limits for age. No midline
shift, ventriculomegaly, mass effect, evidence of mass lesion,
intracranial hemorrhage or evidence of cortically based acute
infarction. Gray-white matter differentiation is within normal
limits throughout the brain. No cortical encephalomalacia
identified.

Calvarium and skull base: Negative.

Paranasal sinuses: Visible paranasal sinuses and mastoids are well
pneumatized.

Orbits: Negative orbits soft tissues. Visualized scalp soft tissues
are within normal limits.

CTA NECK

Skeleton: Largely absent dentition. Degenerative disc and endplate
changes in the lower cervical spine. No acute osseous abnormality
identified.

Upper chest: Paraseptal emphysema. No superior mediastinal
lymphadenopathy.

Other neck: Negative; no neck mass or lymphadenopathy.

Aortic arch: 3 vessel arch configuration. Extensive Calcified aortic
atherosclerosis.

Right carotid system: No brachiocephalic artery or right CCA origin
stenosis despite plaque. Circumferential soft plaque in the right
CCA. At the bifurcation bulky soft and calcified plaque results in
radiographic string sign stenosis over a short 3-4 millimeters
segment (series 20, image 139). Bulky plaque continues to the distal
bulb. The left ECA origin is also affected with high-grade stenosis.
The right ICA remains patent but has a mildly diminished caliber.

Left carotid system: No left CCA origin stenosis despite plaque.
Soft and calcified plaque in the medial left CCA proximal to the
bifurcation. Calcified plaque at the left ICA origin and bulb with
no stenosis.

Vertebral arteries:
No proximal right subclavian artery stenosis despite plaque. The
right vertebral artery is occluded with bulky calcified plaque from
its origin through most of the V1 segment (series 18, image 124).
There is additional right V2 segment calcified plaque. The right
vertebral is reconstituted from muscular branches at C3 and patent
to the skull base.

Less than 50 % stenosis with respect to the distal vessel of the
proximal left subclavian artery due to soft and calcified plaque.
Bulky calcified plaque at the left vertebral artery origin resulting
in String sign stenosis. The left V1 segment is patent but highly
diminutive. The vessel has a more normal caliber beginning at C3-C4
where muscular branches are noted. The left vertebral remains patent
to the skull base.

CTA HEAD

Posterior circulation:

Heavily calcified right Vertebral artery V4 segment with stenosis
and asymmetrically decreased enhancement. The vessel does remain
patent to the vertebrobasilar junction. Comparatively mild calcified
plaque in the left V4 segment with mild stenosis. Normal left PICA
origin. Patent vertebrobasilar junction appearing mostly supplied
from the left. Irregular but patent basilar artery without stenosis.
Patent SCA origins. Normal left PCA origin. Fetal type right PCA
origin. Left posterior communicating artery is diminutive. Bilateral
PCA branches are within normal limits.

Anterior circulation:  Both ICA siphons are patent.

On the right there is heavy siphon calcification with severe
cavernous stenosis (series 17, image 233 and series 18, image 127),
and up to moderate superimposed supraclinoid segment stenosis.
Normal right posterior communicating artery origin.

On the left there is moderate cavernous and supraclinoid segment
stenosis. There is a small infundibulum suspected corresponding to a
diminutive left posterior communicating artery (series 17, image 239
and series 20, image 174).

Both carotid termini are patent. The left ACA A1 segment is
dominant, the right is diminutive or absent. The anterior
communicating artery is normal. There are prominent bilateral
frontal polar branches. The bilateral ACA branches are within normal
limits.

Left MCA M1 segment and bifurcation are within normal limits. Left
MCA branches are within normal limits.

Right MCA M1 segment is mildly irregular without significant
stenosis. Right MCA trifurcation and right MCA branches are within
normal limits.

Venous sinuses: Patent on the delayed images.

Anatomic variants: Fetal type right PCA origin. Dominant left and
diminutive or absent right ACA A1 segments.

Delayed phase: No abnormal enhancement identified.

Review of the MIP images confirms the above findings
IMPRESSION: 1. Radiographic-String-Sign Stenosis at the Right ICA origin due to
bulky soft and calcified plaque. Right ICA remains patent but has a
mildly diminished caliber.
Associated high-grade right ECA origin stenosis.

Downstream superimposed Severe Right ICA cavernous segment stenosis
due to bulky calcified plaque.

2. Positive also for occluded Right Vertebral Artery origin. The
vessel is reconstituted at the distal V2 segment, but heavily
calcified and stenotic again in the right V4 segment.

3. Positive also for severe Left Vertebral Artery origin stenosis
due to bulky calcified plaque. Improved left vertebral artery flow
also in the distal V2 segment due to muscular branches.

4. Left carotid bifurcation plaque without stenosis. Up to moderate
left ICA siphon stenosis due to bulky calcified plaque.

5. Otherwise negative intracranial anterior and posterior
circulation.

6. Normal for age CT appearance of the brain.

7. Aortic Atherosclerosis (0S1SX-87D.D) and Emphysema (0S1SX-7BC.6).

ADDENDUM:
Study discussed by telephone with Dr. DRUAIVALU CADRAMAISOSO on 11/25/2018 at
7377 hours.

*** End of Addendum ***

## 2020-09-26 ENCOUNTER — Ambulatory Visit (INDEPENDENT_AMBULATORY_CARE_PROVIDER_SITE_OTHER): Payer: Medicare Other

## 2020-09-26 ENCOUNTER — Encounter (INDEPENDENT_AMBULATORY_CARE_PROVIDER_SITE_OTHER): Payer: Self-pay | Admitting: Vascular Surgery

## 2020-09-26 ENCOUNTER — Other Ambulatory Visit: Payer: Self-pay

## 2020-09-26 ENCOUNTER — Ambulatory Visit (INDEPENDENT_AMBULATORY_CARE_PROVIDER_SITE_OTHER): Payer: Medicare Other | Admitting: Vascular Surgery

## 2020-09-26 VITALS — BP 149/73 | HR 71 | Resp 16 | Wt 211.0 lb

## 2020-09-26 DIAGNOSIS — I10 Essential (primary) hypertension: Secondary | ICD-10-CM

## 2020-09-26 DIAGNOSIS — I739 Peripheral vascular disease, unspecified: Secondary | ICD-10-CM

## 2020-09-26 DIAGNOSIS — E782 Mixed hyperlipidemia: Secondary | ICD-10-CM

## 2020-09-26 DIAGNOSIS — I6521 Occlusion and stenosis of right carotid artery: Secondary | ICD-10-CM | POA: Diagnosis not present

## 2020-09-26 NOTE — Progress Notes (Signed)
MRN : [id number]  Victor Rojas is a 74 y.o. ([birth year]) male who presents with chief complaint of No chief complaint on file. Marland Kitchen  History of Present Illness:   The patient returns to the office for followup and review of the noninvasive studies. There has been a significant deterioration in the lower extremity symptoms. The patient notes interval shortening of their claudication distance and development of mild rest pain symptoms. No new ulcers or wounds have occurred since the last visit.  There have been no significant changes to the patient's overall health care.  The patient is also seen for follow up evaluation of carotid stenosis. The carotid stenosis followed by ultrasound.   The patient denies amaurosis fugax. There is no recent history of TIA symptoms or focal motor deficits. There is no prior documented CVA.  The patient is taking enteric-coated aspirin 81 mg daily.  There is no history of migraine headaches. There is no history of seizures.  The patient has a history of coronary artery disease, no recent episodes of angina or shortness of breath. The patient denies PAD or claudication symptoms. There is a history of hyperlipidemia which is being treated with a statin.   Previous carotid Duplex done today shows widely patent right CEA with <30% LICA.    ABI's Rt=0.60 and Lt=0.76 (previous ABI's Rt=0.75 and Lt=0.81)  No outpatient medications have been marked as taking for the 09/26/20 encounter (Appointment) with Delana Meyer, Dolores Lory, MD.    Past Medical History:  Diagnosis Date  . Arthritis   . Hiatal hernia   . History of gout   . History of kidney stones   . Hypertension   . Peripheral vascular disease ([place])   . Pre-diabetes     Past Surgical History:  Procedure Laterality Date  . COLONOSCOPY    . COLONOSCOPY WITH PROPOFOL N/A 06/18/2016   Procedure: COLONOSCOPY WITH PROPOFOL;  Surgeon: Lollie Sails, MD;  Location: [facility] ENDOSCOPY;   Service: Endoscopy;  Laterality: N/A;  . ENDARTERECTOMY Right 12/19/2018   Procedure: ENDARTERECTOMY CAROTID;  Surgeon: Katha Cabal, MD;  Location: [facility] ORS;  Service: Vascular;  Laterality: Right;  . ESOPHAGOGASTRODUODENOSCOPY (EGD) WITH PROPOFOL N/A 06/18/2016   Procedure: ESOPHAGOGASTRODUODENOSCOPY (EGD) WITH PROPOFOL;  Surgeon: Lollie Sails, MD;  Location: [facility] ENDOSCOPY;  Service: Endoscopy;  Laterality: N/A;  . HERNIA REPAIR     umbilical  . TONSILLECTOMY      Social History Social History   Tobacco Use  . Smoking status: Former Smoker    Quit date: 02/03/1989    Years since quitting: 31.6  . Smokeless tobacco: Never Used  Vaping Use  . Vaping Use: Never used  Substance Use Topics  . Alcohol use: Yes    Alcohol/week: 6.0 standard drinks    Types: 6 Cans of beer per week  . Drug use: No    Family History Family History  Problem Relation Age of Onset  . Cataracts Mother   . Heart attack Mother   . Cancer Sister     No Known Allergies   REVIEW OF SYSTEMS (Negative unless checked)  Constitutional: [] Weight loss  [] Fever  [] Chills Cardiac: [] Chest pain   [] Chest pressure   [] Palpitations   [] Shortness of breath when laying flat   [] Shortness of breath with exertion. Vascular:  [x] Pain in legs with walking   [] Pain in legs at rest  [] History of DVT   [] Phlebitis   [] Swelling in legs   [] Varicose veins   [] Non-healing ulcers  Pulmonary:   [] Uses home oxygen   [] Productive cough   [] Hemoptysis   [] Wheeze  [] COPD   [] Asthma Neurologic:  [] Dizziness   [] Seizures   [] History of stroke   [] History of TIA  [] Aphasia   [] Vissual changes   [] Weakness or numbness in arm   [] Weakness or numbness in leg Musculoskeletal:   [] Joint swelling   [x] Joint pain   [] Low back pain Hematologic:  [] Easy bruising  [] Easy bleeding   [] Hypercoagulable state   [] Anemic Gastrointestinal:  [] Diarrhea   [] Vomiting  [] Gastroesophageal reflux/heartburn   [] Difficulty  swallowing. Genitourinary:  [] Chronic kidney disease   [] Difficult urination  [] Frequent urination   [] Blood in urine Skin:  [] Rashes   [] Ulcers  Psychological:  [] History of anxiety   []  History of major depression.  Physical Examination  There were no vitals filed for this visit. There is no height or weight on file to calculate BMI. Gen: WD/WN, NAD Head: [place]/AT, No temporalis wasting.  Ear/Nose/Throat: Hearing grossly intact, nares w/o erythema or drainage Eyes: PER, EOMI, sclera nonicteric.  Neck: Supple, no large masses.   Pulmonary:  Good air movement, no audible wheezing bilaterally, no use of accessory muscles.  Cardiac: RRR, no JVD Vascular:   Bilateral carotid bruit Vessel Right Left  Radial Palpable Palpable  Carotid Palpable Palpable  PT Not Palpable Not Palpable  DP Not Palpable Not Palpable  Gastrointestinal: Non-distended. No guarding/no peritoneal signs.  Musculoskeletal: M/S 5/5 throughout.  No deformity or atrophy.  Neurologic: CN 2-12 intact. Symmetrical.  Speech is fluent. Motor exam as listed above. Psychiatric: Judgment intact, Mood & affect appropriate for pt's clinical situation. Dermatologic: No rashes or ulcers noted.  No changes consistent with cellulitis.  CBC Lab Results  Component Value Date   WBC 10.5 12/20/2018   HGB 14.0 12/20/2018   HCT 42.4 12/20/2018   MCV 107.9 (H) 12/20/2018   PLT 116 (L) 12/20/2018    BMET    Component Value Date/Time   NA 140 12/20/2018 0429   K 4.5 12/20/2018 0429   CL 106 12/20/2018 0429   CO2 24 12/20/2018 0429   GLUCOSE 164 (H) 12/20/2018 0429   BUN 25 (H) 12/20/2018 0429   CREATININE 1.00 12/20/2018 0429   CALCIUM 8.5 (L) 12/20/2018 0429   GFRNONAA >60 12/20/2018 0429   GFRAA >60 12/20/2018 0429   CrCl cannot be calculated (Patient's most recent lab result is older than the maximum 21 days allowed.).  COAG Lab Results  Component Value Date   INR 1.08 12/15/2018    Radiology No results  found.   Assessment/Plan 1. Carotid stenosis, asymptomatic, right Recommend:  Given the patient's asymptomatic subcritical stenosis no further invasive testing or surgery at this time.  Duplex ultrasound shows <70% stenosis bilaterally.  Continue antiplatelet therapy as prescribed Continue management of CAD, HTN and Hyperlipidemia Healthy heart diet,  encouraged exercise at least 4 times per week Follow up in 6 months with duplex ultrasound and physical exam  - VAS US CAROTID; Future  2. PAD (peripheral artery disease) (HCC)  Recommend:  The patient has evidence of atherosclerosis of the lower extremities with claudication.  The patient does not voice lifestyle limiting changes at this point in time.  Noninvasive studies do not suggest clinically significant change.  No invasive studies, angiography or surgery at this time The patient should continue walking and begin a more formal exercise program.  The patient should continue antiplatelet therapy and aggressive treatment of the lipid abnormalities  No changes in the  patient's medications at this time  - VAS [place] ABI WITH/WO TBI; Future  3. Primary hypertension Continue antihypertensive medications as already ordered, these medications have been reviewed and there are no changes at this time.   4. Hyperlipidemia, mixed Continue statin as ordered and reviewed, no changes at this time    [name], MD  09/26/2020 8:34 AM

## 2020-09-28 ENCOUNTER — Encounter (INDEPENDENT_AMBULATORY_CARE_PROVIDER_SITE_OTHER): Payer: Self-pay | Admitting: Vascular Surgery

## 2021-01-05 ENCOUNTER — Other Ambulatory Visit
Admission: RE | Admit: 2021-01-05 | Discharge: 2021-01-05 | Disposition: A | Discharge status: Home / Self Care | Payer: Medicare Other | Source: Ambulatory Visit | Attending: Gastroenterology | Admitting: Gastroenterology

## 2021-01-05 ENCOUNTER — Other Ambulatory Visit: Payer: Self-pay

## 2021-01-05 DIAGNOSIS — Z01812 Encounter for preprocedural laboratory examination: Secondary | ICD-10-CM | POA: Insufficient documentation

## 2021-01-05 DIAGNOSIS — Z20822 Contact with and (suspected) exposure to covid-19: Secondary | ICD-10-CM | POA: Insufficient documentation

## 2021-01-05 LAB — SARS CORONAVIRUS 2 (TAT 6-24 HRS): SARS Coronavirus 2: NEGATIVE

## 2021-01-06 ENCOUNTER — Encounter: Payer: Self-pay | Admitting: *Deleted

## 2021-01-09 ENCOUNTER — Ambulatory Visit: Payer: Medicare Other | Admitting: Certified Registered Nurse Anesthetist

## 2021-01-09 ENCOUNTER — Ambulatory Visit
Admission: RE | Admit: 2021-01-09 | Discharge: 2021-01-09 | Disposition: A | Discharge status: Home / Self Care | Payer: Medicare Other | Attending: Gastroenterology | Admitting: Gastroenterology

## 2021-01-09 ENCOUNTER — Encounter
Admission: RE | Disposition: A | Discharge status: Home / Self Care | Payer: Self-pay | Source: Home / Self Care | Attending: Gastroenterology

## 2021-01-09 ENCOUNTER — Encounter: Payer: Self-pay | Admitting: *Deleted

## 2021-01-09 DIAGNOSIS — Z86010 Personal history of colonic polyps: Secondary | ICD-10-CM | POA: Diagnosis not present

## 2021-01-09 DIAGNOSIS — Z79899 Other long term (current) drug therapy: Secondary | ICD-10-CM | POA: Diagnosis not present

## 2021-01-09 DIAGNOSIS — K219 Gastro-esophageal reflux disease without esophagitis: Secondary | ICD-10-CM | POA: Insufficient documentation

## 2021-01-09 DIAGNOSIS — R1310 Dysphagia, unspecified: Secondary | ICD-10-CM | POA: Insufficient documentation

## 2021-01-09 DIAGNOSIS — K2970 Gastritis, unspecified, without bleeding: Secondary | ICD-10-CM | POA: Insufficient documentation

## 2021-01-09 DIAGNOSIS — D124 Benign neoplasm of descending colon: Secondary | ICD-10-CM | POA: Diagnosis not present

## 2021-01-09 DIAGNOSIS — Z1211 Encounter for screening for malignant neoplasm of colon: Secondary | ICD-10-CM | POA: Diagnosis not present

## 2021-01-09 DIAGNOSIS — Z7982 Long term (current) use of aspirin: Secondary | ICD-10-CM | POA: Insufficient documentation

## 2021-01-09 DIAGNOSIS — K640 First degree hemorrhoids: Secondary | ICD-10-CM | POA: Diagnosis not present

## 2021-01-09 DIAGNOSIS — D122 Benign neoplasm of ascending colon: Secondary | ICD-10-CM | POA: Diagnosis not present

## 2021-01-09 DIAGNOSIS — D120 Benign neoplasm of cecum: Secondary | ICD-10-CM | POA: Diagnosis not present

## 2021-01-09 HISTORY — PX: ESOPHAGOGASTRODUODENOSCOPY (EGD) WITH PROPOFOL: SHX5813

## 2021-01-09 HISTORY — DX: Gastro-esophageal reflux disease without esophagitis: K21.9

## 2021-01-09 HISTORY — PX: COLONOSCOPY WITH PROPOFOL: SHX5780

## 2021-01-09 HISTORY — DX: Hyperlipidemia, unspecified: E78.5

## 2021-01-09 HISTORY — DX: Benign neoplasm of colon, unspecified: D12.6

## 2021-01-09 HISTORY — DX: Diverticulosis of intestine, part unspecified, without perforation or abscess without bleeding: K57.90

## 2021-01-09 HISTORY — DX: Type 2 diabetes mellitus without complications: E11.9

## 2021-01-09 SURGERY — COLONOSCOPY WITH PROPOFOL
Anesthesia: General

## 2021-01-09 MED ORDER — EPHEDRINE SULFATE 50 MG/ML IJ SOLN
INTRAMUSCULAR | Status: DC | PRN
  Administered 2021-01-09 (×3): 5 mg via INTRAVENOUS

## 2021-01-09 MED ORDER — PHENYLEPHRINE HCL (PRESSORS) 10 MG/ML IV SOLN
INTRAVENOUS | Status: DC | PRN
  Administered 2021-01-09 (×4): 100 ug via INTRAVENOUS

## 2021-01-09 MED ORDER — GLYCOPYRROLATE 0.2 MG/ML IJ SOLN
INTRAMUSCULAR | Status: AC
  Filled 2021-01-09: qty 1

## 2021-01-09 MED ORDER — FENTANYL CITRATE (PF) 100 MCG/2ML IJ SOLN
INTRAMUSCULAR | Status: DC | PRN
  Administered 2021-01-09 (×2): 25 ug via INTRAVENOUS

## 2021-01-09 MED ORDER — EPHEDRINE 5 MG/ML INJ
INTRAVENOUS | Status: AC
  Filled 2021-01-09: qty 10

## 2021-01-09 MED ORDER — FENTANYL CITRATE (PF) 100 MCG/2ML IJ SOLN
INTRAMUSCULAR | Status: AC
  Filled 2021-01-09: qty 2

## 2021-01-09 MED ORDER — SODIUM CHLORIDE 0.9 % IV SOLN
INTRAVENOUS | Status: DC
  Administered 2021-01-09: 1000 mL via INTRAVENOUS

## 2021-01-09 MED ORDER — LIDOCAINE HCL (CARDIAC) PF 100 MG/5ML IV SOSY
PREFILLED_SYRINGE | INTRAVENOUS | Status: DC | PRN
  Administered 2021-01-09: 100 mg via INTRAVENOUS

## 2021-01-09 MED ORDER — PROPOFOL 500 MG/50ML IV EMUL
INTRAVENOUS | Status: DC | PRN
  Administered 2021-01-09: 150 ug/kg/min via INTRAVENOUS

## 2021-01-09 MED ORDER — GLYCOPYRROLATE 0.2 MG/ML IJ SOLN
INTRAMUSCULAR | Status: DC | PRN
  Administered 2021-01-09: .2 mg via INTRAVENOUS

## 2021-01-09 MED ORDER — PROPOFOL 500 MG/50ML IV EMUL
INTRAVENOUS | Status: AC
  Filled 2021-01-09: qty 50

## 2021-01-09 MED ORDER — PROPOFOL 10 MG/ML IV BOLUS
INTRAVENOUS | Status: DC | PRN
  Administered 2021-01-09: 40 mg via INTRAVENOUS
  Administered 2021-01-09: 20 mg via INTRAVENOUS

## 2021-01-09 NOTE — Anesthesia Procedure Notes (Signed)
Procedure Name: MAC Date/Time: 01/09/2021 9:48 AM Performed by: Lily Peer, Allan Bacigalupi, CRNA Pre-anesthesia Checklist: Patient identified, Emergency Drugs available, Suction available, Patient being monitored and Timeout performed Patient Re-evaluated:Patient Re-evaluated prior to induction Oxygen Delivery Method: Nasal cannula Induction Type: IV induction

## 2021-01-09 NOTE — Interval H&P Note (Signed)
History and Physical Interval Note:  01/09/2021 9:50 AM  Victor Rojas  has presented today for surgery, with the diagnosis of HX ADEN POLYPS DYSPHAGIA.  The various methods of treatment have been discussed with the patient and family. After consideration of risks, benefits and other options for treatment, the patient has consented to  Procedure(s): COLONOSCOPY WITH PROPOFOL (N/A) ESOPHAGOGASTRODUODENOSCOPY (EGD) WITH PROPOFOL (N/A) as a surgical intervention.  The patient's history has been reviewed, patient examined, no change in status, stable for surgery.  I have reviewed the patient's chart and labs.  Questions were answered to the patient's satisfaction.     Lesly Rubenstein  Ok to proceed with EGD/Colonoscopy

## 2021-01-09 NOTE — Op Note (Signed)
[facility] Patient Name: Victor Rojas Procedure Date: 01/09/2021 9:37 AM MRN: [id number] Account #: [id number] Date of Birth: 10-14-1946 Admit Type: Outpatient Age: 75 Room: [facility] ENDO ROOM 3 Gender: Male Note Status: Finalized Procedure:             Upper GI endoscopy Indications:           Dysphagia, Gastro-esophageal reflux disease Providers:             Andrey Farmer MD, MD Medicines:             Monitored Anesthesia Care Complications:         No immediate complications. Estimated blood loss:                         Minimal. Procedure:             Pre-Anesthesia Assessment:                        - Prior to the procedure, a History and Physical was                         performed, and patient medications and allergies were                         reviewed. The patient is competent. The risks and                         benefits of the procedure and the sedation options and                         risks were discussed with the patient. All questions                         were answered and informed consent was obtained.                         Patient identification and proposed procedure were                         verified by the physician, the nurse, the anesthetist                         and the technician in the endoscopy suite. Mental                         Status Examination: alert and oriented. Airway                         Examination: normal oropharyngeal airway and neck                         mobility. Respiratory Examination: clear to                         auscultation. CV Examination: normal. Prophylactic                         Antibiotics: The patient does not require prophylactic  antibiotics. Prior Anticoagulants: The patient has                         taken no previous anticoagulant or antiplatelet                         agents. ASA Grade Assessment: II - A patient with mild                          systemic disease. After reviewing the risks and                         benefits, the patient was deemed in satisfactory                         condition to undergo the procedure. The anesthesia                         plan was to use monitored anesthesia care (MAC).                         Immediately prior to administration of medications,                         the patient was re-assessed for adequacy to receive                         sedatives. The heart rate, respiratory rate, oxygen                         saturations, blood pressure, adequacy of pulmonary                         ventilation, and response to care were monitored                         throughout the procedure. The physical status of the                         patient was re-assessed after the procedure.                        After obtaining informed consent, the endoscope was                         passed under direct vision. Throughout the procedure,                         the patient's blood pressure, pulse, and oxygen                         saturations were monitored continuously. The Endoscope                         was introduced through the mouth, and advanced to the                         second part of duodenum. The upper GI endoscopy was  accomplished without difficulty. The patient tolerated                         the procedure well. 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      The exam of the esophagus was otherwise normal.      Localized mild inflammation characterized by erythema was found in the       gastric antrum. Biopsies were taken with a cold forceps for Helicobacter       pylori testing. Estimated blood loss was minimal.      The examined duodenum was normal. Impression:            - Ectopic gastric mucosa in the upper third of the                         esophagus.                         - Gastritis. Biopsied.                        - Normal examined duodenum. Recommendation:        - Discharge patient to home.                        - Resume previous diet.                        - Continue present medications.                        - Await pathology results.                        - Return to referring physician as previously                         scheduled. Procedure Code(s):     --- Professional ---                        [phone], Esophagogastroduodenoscopy, flexible,                         transoral; with biopsy, single or multiple Diagnosis Code(s):     --- Professional ---                        Q40.2, Other specified congenital malformations of                         stomach                        K29.70, Gastritis, unspecified, without bleeding                        R13.10, Dysphagia, unspecified                        K21.9, Gastro-esophageal reflux disease without  esophagitis CPT copyright 2019 American Medical Association. All rights reserved. The codes documented in this report are preliminary and upon coder review may  be revised to meet current compliance requirements. Andrey Farmer MD, MD 01/09/2021 10:27:42 AM Number of Addenda: 0 Note Initiated On: 01/09/2021 9:37 AM Estimated Blood Loss:  Estimated blood loss was minimal.      [facility]

## 2021-01-09 NOTE — H&P (Signed)
Outpatient short stay form Pre-procedure 01/09/2021 9:43 AM Victor Miyamoto MD, MPH  Primary Physician: Dr. Edwina Barth  Reason for visit:  GERD/Surveillance colonoscopy  History of present illness:   75 y/o gentleman with history of thrombocytopenia and obesity here for EGD for reported history of occasional dysphagia to pills but denies any to food or liquids. Had a 6 mm TA 5 years ago so here for surveillance colonoscopy. No known family history of GI malignancies. History of umbilical hernia surgery but the hernia has recurred. No blood thinners.    Current Facility-Administered Medications:  .  0.9 %  sodium chloride infusion, , Intravenous, Continuous, [name], [name], MD, Last Rate: 20 mL/hr at 01/09/21 0933, 1,000 mL at 01/09/21 0933  Medications Prior to Admission  Medication Sig Dispense Refill Last Dose  . allopurinol (ZYLOPRIM) 300 MG tablet Take 300 mg by mouth daily.   01/08/2021 at Unknown time  . amLODipine (NORVASC) 2.5 MG tablet Take 2.5 mg by mouth daily.   01/09/2021 at 0400  . aspirin EC 81 MG tablet Take 81 mg by mouth daily.    Past Week at Unknown time  . atorvastatin (LIPITOR) 20 MG tablet Take 20 mg by mouth daily.  11 01/08/2021 at Unknown time  . diphenhydrAMINE (BENADRYL) 25 mg capsule Take 25 mg by mouth daily.   01/08/2021 at Unknown time  . indomethacin (INDOCIN) 25 MG capsule Take 25 mg by mouth 2 (two) times daily with a meal.    at prn  . lisinopril-hydrochlorothiazide (PRINZIDE,ZESTORETIC) 20-12.5 MG tablet Take 2 tablets by mouth daily.    01/09/2021 at 0400  . metoprolol tartrate (LOPRESSOR) 25 MG tablet Take 25 mg by mouth 2 (two) times daily.   01/08/2021  . Multiple Vitamin (MULTI-VITAMINS) TABS Take 1 tablet by mouth daily.    01/08/2021 at Unknown time  . vitamin B-12 (CYANOCOBALAMIN) 1000 MCG tablet Take 1,000 mcg by mouth daily.   01/08/2021 at Unknown time     No Known Allergies   Past Medical History:  Diagnosis Date  . Adenoma of colon   .  Arthritis   . Diabetes mellitus without complication ([place])   . Diverticulosis   . GERD (gastroesophageal reflux disease)   . Hiatal hernia   . Hiatal hernia   . History of gout   . History of kidney stones   . Hyperlipemia   . Hypertension   . Peripheral vascular disease ([place])   . Pre-diabetes     Review of systems:  Otherwise negative.    Physical Exam  Gen: Alert, oriented. Appears stated age.  HEENT: PERRLA. Lungs: No respiratory distress CV: RRR Abd: soft, benign, no masses Ext: No edema. Pulses 2+    Planned procedures: Proceed with EGD/colonoscopy. The patient understands the nature of the planned procedure, indications, risks, alternatives and potential complications including but not limited to bleeding, infection, perforation, damage to internal organs and possible oversedation/side effects from anesthesia. The patient agrees and gives consent to proceed.  Please refer to procedure notes for findings, recommendations and patient disposition/instructions.     Victor Miyamoto MD, MPH Gastroenterology 01/09/2021  9:43 AM

## 2021-01-09 NOTE — Transfer of Care (Signed)
Immediate Anesthesia Transfer of Care Note  Patient: Victor Rojas  Procedure(s) Performed: COLONOSCOPY WITH PROPOFOL (N/A ) ESOPHAGOGASTRODUODENOSCOPY (EGD) WITH PROPOFOL (N/A )  Patient Location: PACU and Endoscopy Unit  Anesthesia Type:General  Level of Consciousness: drowsy  Airway & Oxygen Therapy: Patient Spontanous Breathing  Post-op Assessment: Report given to RN and Post -op Vital signs reviewed and stable  Post vital signs: Reviewed and stable  Last Vitals:  Vitals Value Taken Time  BP 107/57 01/09/21 1028  Temp 36.1 C 01/09/21 1027  Pulse 48 01/09/21 1029  Resp 12 01/09/21 1029  SpO2 95 % 01/09/21 1029  Vitals shown include unvalidated device data.  Last Pain:  Vitals:   01/09/21 1027  TempSrc: Temporal  PainSc: Asleep         Complications: No complications documented.

## 2021-01-09 NOTE — Op Note (Signed)
[facility] Gastroenterology Patient Name: Victor Rojas Procedure Date: 01/09/2021 9:36 AM MRN: [id number] Account #: [id number] Date of Birth: [birth year] Admit Type: Outpatient Age: 75 Room: [facility] ENDO ROOM 3 Gender: Male Note Status: Finalized Procedure:             Colonoscopy Indications:           High risk colon cancer surveillance: Personal history                         of non-advanced adenoma Providers:             Andrey Farmer MD, MD Medicines:             Monitored Anesthesia Care Complications:         No immediate complications. Estimated blood loss:                         Minimal. Procedure:             Pre-Anesthesia Assessment:                        - Prior to the procedure, a History and Physical was                         performed, and patient medications and allergies were                         reviewed. The patient is competent. The risks and                         benefits of the procedure and the sedation options and                         risks were discussed with the patient. All questions                         were answered and informed consent was obtained.                         Patient identification and proposed procedure were                         verified by the physician, the nurse, the anesthetist                         and the technician in the endoscopy suite. Mental                         Status Examination: alert and oriented. Airway                         Examination: normal oropharyngeal airway and neck                         mobility. Respiratory Examination: clear to                         auscultation. CV Examination: normal. Prophylactic  Antibiotics: The patient does not require prophylactic                         antibiotics. Prior Anticoagulants: The patient has                         taken no previous anticoagulant or antiplatelet                         agents. ASA  Grade Assessment: II - A patient with mild                         systemic disease. After reviewing the risks and                         benefits, the patient was deemed in satisfactory                         condition to undergo the procedure. The anesthesia                         plan was to use monitored anesthesia care (MAC).                         Immediately prior to administration of medications,                         the patient was re-assessed for adequacy to receive                         sedatives. The heart rate, respiratory rate, oxygen                         saturations, blood pressure, adequacy of pulmonary                         ventilation, and response to care were monitored                         throughout the procedure. The physical status of the                         patient was re-assessed after the procedure.                        After obtaining informed consent, the colonoscope was                         passed under direct vision. Throughout the procedure,                         the patient's blood pressure, pulse, and oxygen                         saturations were monitored continuously. The                         Colonoscope was introduced through the anus and  advanced to the the cecum, identified by appendiceal                         orifice and ileocecal valve. The colonoscopy was                         performed without difficulty. The patient tolerated                         the procedure well. The quality of the bowel                         preparation was good. Findings:      The perianal and digital rectal examinations were normal.      A 5 mm polyp was found in the cecum. The polyp was sessile. The polyp       was removed with a cold snare. Resection and retrieval were complete.       Estimated blood loss was minimal.      Two sessile polyps were found in the ascending colon. The polyps were 3       mm in  size. These polyps were removed with a cold snare. Resection and       retrieval were complete. Estimated blood loss was minimal.      A less than 1 mm polyp was found in the ascending colon. The polyp was       sessile. The polyp was removed with a jumbo cold forceps. Resection and       retrieval were complete. Estimated blood loss was minimal.      A 4 mm polyp was found in the descending colon. The polyp was sessile.       The polyp was removed with a cold snare. Resection and retrieval were       complete. Estimated blood loss was minimal.      Internal hemorrhoids were found during retroflexion. The hemorrhoids       were Grade I (internal hemorrhoids that do not prolapse).      The exam was otherwise without abnormality on direct and retroflexion       views. Impression:            - One 5 mm polyp in the cecum, removed with a cold                         snare. Resected and retrieved.                        - Two 3 mm polyps in the ascending colon, removed with                         a cold snare. Resected and retrieved.                        - One less than 1 mm polyp in the ascending colon,                         removed with a jumbo cold forceps. Resected and                         retrieved.                        -  One 4 mm polyp in the descending colon, removed with                         a cold snare. Resected and retrieved.                        - Internal hemorrhoids.                        - The examination was otherwise normal on direct and                         retroflexion views. Recommendation:        - Discharge patient to home.                        - Resume previous diet.                        - Continue present medications.                        - Await pathology results.                        - Repeat colonoscopy in 3 years for surveillance.                        - Return to referring physician as previously                          scheduled. Procedure Code(s):     --- Professional ---                        [phone], Colonoscopy, flexible; with removal of                         tumor(s), polyp(s), or other lesion(s) by snare                         technique                        45380, 60, Colonoscopy, flexible; with biopsy, single                         or multiple Diagnosis Code(s):     --- Professional ---                        K63.5, Polyp of colon                        Z86.010, Personal history of colonic polyps                        K64.0, First degree hemorrhoids CPT copyright 2019 [employer] Medical Association. All rights reserved. The codes documented in this report are preliminary and upon coder review may  be revised to meet current compliance requirements. Andrey Farmer MD, MD 01/09/2021 10:32:43 AM Number of Addenda: 0 Note Initiated On: 01/09/2021 9:36 AM Scope Withdrawal Time: 0 hours 15  minutes 31 seconds  Total Procedure Duration: 0 hours 18 minutes 40 seconds  Estimated Blood Loss:  Estimated blood loss was minimal.      [facility]

## 2021-01-09 NOTE — Anesthesia Preprocedure Evaluation (Signed)
Anesthesia Evaluation  Patient identified by MRN, date of birth, ID band Patient awake    Reviewed: Allergy & Precautions, NPO status , Patient's Chart, lab work & pertinent test results  History of Anesthesia Complications Negative for: history of anesthetic complications  Airway Mallampati: II       Dental  (+) Partial Lower, Partial Upper   Pulmonary neg sleep apnea, neg COPD, Not current smoker, former smoker,           Cardiovascular hypertension, Pt. on medications (-) Past MI and (-) CHF (-) dysrhythmias (-) Valvular Problems/Murmurs     Neuro/Psych neg Seizures    GI/Hepatic Neg liver ROS, hiatal hernia, GERD  Medicated,  Endo/Other  diabetes (borderline), Type 2  Renal/GU      Musculoskeletal   Abdominal   Peds  Hematology   Anesthesia Other Findings   Reproductive/Obstetrics                             Anesthesia Physical Anesthesia Plan  ASA: III  Anesthesia Plan: General   Post-op Pain Management:    Induction: Intravenous  PONV Risk Score and Plan: 2 and Propofol infusion and TIVA  Airway Management Planned: Nasal Cannula  Additional Equipment:   Intra-op Plan:   Post-operative Plan:   Informed Consent: I have reviewed the patients History and Physical, chart, labs and discussed the procedure including the risks, benefits and alternatives for the proposed anesthesia with the patient or authorized representative who has indicated his/her understanding and acceptance.       Plan Discussed with:   Anesthesia Plan Comments:         Anesthesia Quick Evaluation

## 2021-01-09 NOTE — Anesthesia Postprocedure Evaluation (Signed)
Anesthesia Post Note  Patient: Victor Rojas  Procedure(s) Performed: COLONOSCOPY WITH PROPOFOL (N/A ) ESOPHAGOGASTRODUODENOSCOPY (EGD) WITH PROPOFOL (N/A )  Patient location during evaluation: Endoscopy Anesthesia Type: General Level of consciousness: awake and alert Pain management: pain level controlled Vital Signs Assessment: post-procedure vital signs reviewed and stable Respiratory status: spontaneous breathing and respiratory function stable Cardiovascular status: stable Anesthetic complications: no   No complications documented.   Last Vitals:  Vitals:   01/09/21 1027 01/09/21 1047  BP: (!) 107/57 128/77  Pulse:    Resp:    Temp: (!) 36.1 C   SpO2:      Last Pain:  Vitals:   01/09/21 1057  TempSrc:   PainSc: 0-No pain                 Pearl Bents K

## 2021-01-10 ENCOUNTER — Encounter: Payer: Self-pay | Admitting: Gastroenterology

## 2021-01-10 LAB — SURGICAL PATHOLOGY

## 2021-03-27 ENCOUNTER — Other Ambulatory Visit: Payer: Self-pay

## 2021-03-27 ENCOUNTER — Ambulatory Visit (INDEPENDENT_AMBULATORY_CARE_PROVIDER_SITE_OTHER): Payer: Medicare Other

## 2021-03-27 ENCOUNTER — Ambulatory Visit (INDEPENDENT_AMBULATORY_CARE_PROVIDER_SITE_OTHER): Payer: Medicare Other | Admitting: Nurse Practitioner

## 2021-03-27 DIAGNOSIS — I739 Peripheral vascular disease, unspecified: Secondary | ICD-10-CM

## 2021-03-27 DIAGNOSIS — I6521 Occlusion and stenosis of right carotid artery: Secondary | ICD-10-CM

## 2021-03-29 ENCOUNTER — Encounter (INDEPENDENT_AMBULATORY_CARE_PROVIDER_SITE_OTHER): Payer: Self-pay | Admitting: *Deleted

## 2021-09-22 ENCOUNTER — Other Ambulatory Visit (INDEPENDENT_AMBULATORY_CARE_PROVIDER_SITE_OTHER): Payer: Self-pay | Admitting: Nurse Practitioner

## 2021-09-22 DIAGNOSIS — I739 Peripheral vascular disease, unspecified: Secondary | ICD-10-CM

## 2021-09-25 ENCOUNTER — Ambulatory Visit (INDEPENDENT_AMBULATORY_CARE_PROVIDER_SITE_OTHER): Payer: Medicare Other

## 2021-09-25 ENCOUNTER — Ambulatory Visit (INDEPENDENT_AMBULATORY_CARE_PROVIDER_SITE_OTHER): Payer: Medicare Other | Admitting: Nurse Practitioner

## 2021-09-25 ENCOUNTER — Encounter (INDEPENDENT_AMBULATORY_CARE_PROVIDER_SITE_OTHER): Payer: Self-pay | Admitting: Nurse Practitioner

## 2021-09-25 ENCOUNTER — Other Ambulatory Visit: Payer: Self-pay

## 2021-09-25 VITALS — BP 142/81 | HR 60 | Resp 16 | Wt 209.6 lb

## 2021-09-25 DIAGNOSIS — I739 Peripheral vascular disease, unspecified: Secondary | ICD-10-CM | POA: Diagnosis not present

## 2021-09-25 DIAGNOSIS — I6523 Occlusion and stenosis of bilateral carotid arteries: Secondary | ICD-10-CM | POA: Diagnosis not present

## 2021-09-25 DIAGNOSIS — I10 Essential (primary) hypertension: Secondary | ICD-10-CM | POA: Diagnosis not present

## 2021-09-25 DIAGNOSIS — E782 Mixed hyperlipidemia: Secondary | ICD-10-CM

## 2021-10-01 ENCOUNTER — Encounter (INDEPENDENT_AMBULATORY_CARE_PROVIDER_SITE_OTHER): Payer: Self-pay | Admitting: Nurse Practitioner

## 2021-10-01 NOTE — Progress Notes (Signed)
Subjective:    Patient ID: Victor Rojas, male    DOB: 1946-01-17, 75 y.o.   MRN: [id number] Chief Complaint  Patient presents with   Follow-up    Ultrasound follow up    Victor Rojas is a 75 year old male that returns to the office for followup and review of the noninvasive studies. There have been no interval changes in lower extremity symptoms. No interval shortening of the patient's claudication distance or development of rest pain symptoms. No new ulcers or wounds have occurred since the last visit.  There have been no significant changes to the patient's overall health care.  The patient denies amaurosis fugax or recent TIA symptoms. There are no recent neurological changes noted. The patient denies history of DVT, PE or superficial thrombophlebitis. The patient denies recent episodes of angina or shortness of breath.   ABI Rt=0.76 and Lt=0.93  (previous ABI's Rt=0.66 and Lt=0.68) Duplex ultrasound of the right lower extremity shows monophasic waveforms with biphasic waveforms in the left lower extremity.  Normal toe waveforms are noted bilaterally   Review of Systems  Musculoskeletal:  Positive for back pain.  All other systems reviewed and are negative.     Objective:   Physical Exam Vitals reviewed.  HENT:     Head: Normocephalic.  Cardiovascular:     Rate and Rhythm: Normal rate.     Pulses:          Dorsalis pedis pulses are detected w/ Doppler on the right side and 1+ on the left side.       Posterior tibial pulses are detected w/ Doppler on the right side and 1+ on the left side.  Pulmonary:     Effort: Pulmonary effort is normal.  Skin:    General: Skin is warm and dry.  Neurological:     Mental Status: He is alert and oriented to person, place, and time.  Psychiatric:        Mood and Affect: Mood normal.        Behavior: Behavior normal.        Thought Content: Thought content normal.        Judgment: Judgment normal.    BP (!) 142/81 (BP  Location: Right Arm)   Pulse 60   Resp 16   Wt 209 lb 9.6 oz (95.1 kg)   BMI 31.87 kg/m   Past Medical History:  Diagnosis Date   Adenoma of colon    Arthritis    Diabetes mellitus without complication (HCC)    Diverticulosis    GERD (gastroesophageal reflux disease)    Hiatal hernia    Hiatal hernia    History of gout    History of kidney stones    Hyperlipemia    Hypertension    Peripheral vascular disease ([place])    Pre-diabetes     Social History   Socioeconomic History   Marital status: Married    Spouse name: Not on file   Number of children: Not on file   Years of education: Not on file   Highest education level: Not on file  Occupational History   Not on file  Tobacco Use   Smoking status: Former    Types: Cigarettes    Quit date: 02/03/1989    Years since quitting: 32.6   Smokeless tobacco: Never  Vaping Use   Vaping Use: Never used  Substance and Sexual Activity   Alcohol use: Yes    Alcohol/week: 6.0 standard drinks  Types: 6 Cans of beer per week   Drug use: No   Sexual activity: Not on file  Other Topics Concern   Not on file  Social History Narrative   Not on file   Social Determinants of Health   Financial Resource Strain: Not on file  Food Insecurity: Not on file  Transportation Needs: Not on file  Physical Activity: Not on file  Stress: Not on file  Social Connections: Not on file  Intimate Partner Violence: Not on file    Past Surgical History:  Procedure Laterality Date   COLONOSCOPY     COLONOSCOPY WITH PROPOFOL N/A 06/18/2016   Procedure: COLONOSCOPY WITH PROPOFOL;  Surgeon: Lollie Sails, MD;  Location: [facility] ENDOSCOPY;  Service: Endoscopy;  Laterality: N/A;   COLONOSCOPY WITH PROPOFOL N/A 01/09/2021   Procedure: COLONOSCOPY WITH PROPOFOL;  Surgeon: Lesly Rubenstein, MD;  Location: [facility] ENDOSCOPY;  Service: Endoscopy;  Laterality: N/A;   ENDARTERECTOMY Right 12/19/2018   Procedure: ENDARTERECTOMY CAROTID;  Surgeon:  Katha Cabal, MD;  Location: [facility] ORS;  Service: Vascular;  Laterality: Right;   ESOPHAGOGASTRODUODENOSCOPY (EGD) WITH PROPOFOL N/A 06/18/2016   Procedure: ESOPHAGOGASTRODUODENOSCOPY (EGD) WITH PROPOFOL;  Surgeon: Lollie Sails, MD;  Location: [facility] ENDOSCOPY;  Service: Endoscopy;  Laterality: N/A;   ESOPHAGOGASTRODUODENOSCOPY (EGD) WITH PROPOFOL N/A 01/09/2021   Procedure: ESOPHAGOGASTRODUODENOSCOPY (EGD) WITH PROPOFOL;  Surgeon: Lesly Rubenstein, MD;  Location: [facility] ENDOSCOPY;  Service: Endoscopy;  Laterality: N/A;   HERNIA REPAIR     umbilical   TONSILLECTOMY      Family History  Problem Relation Age of Onset   Cataracts Mother    Heart attack Mother    Cancer Sister     No Known Allergies  CBC Latest Ref Rng & Units 12/20/2018 12/15/2018 06/18/2016  WBC 4.0 - 10.5 K/uL 10.5 6.3 7.0  Hemoglobin 13.0 - 17.0 g/dL 14.0 15.4 15.8  Hematocrit 39.0 - 52.0 % 42.4 45.3 46.1  Platelets 150 - 400 K/uL 116(L) 124(L) 140(L)      CMP     Component Value Date/Time   NA 140 12/20/2018 0429   K 4.5 12/20/2018 0429   CL 106 12/20/2018 0429   CO2 24 12/20/2018 0429   GLUCOSE 164 (H) 12/20/2018 0429   BUN 25 (H) 12/20/2018 0429   CREATININE 1.00 12/20/2018 0429   CALCIUM 8.5 (L) 12/20/2018 0429   GFRNONAA >60 12/20/2018 0429   GFRAA >60 12/20/2018 0429     No results found.     Assessment & Plan:   1. PAD (peripheral artery disease) (HCC)  Recommend:  The patient has evidence of atherosclerosis of the lower extremities with claudication.  The patient does not voice lifestyle limiting changes at this point in time.  Noninvasive studies do not suggest clinically significant change.  No invasive studies, angiography or surgery at this time The patient should continue walking and begin a more formal exercise program.  The patient should continue antiplatelet therapy and aggressive treatment of the lipid abnormalities  No changes in the patient's medications at this  time  The patient should continue wearing graduated compression socks 10-15 mmHg strength to control the mild edema.    We will plan on reevaluating noninvasive studies for PAD in 1 year 2. Bilateral carotid artery stenosis Patient had a carotid endarterectomy and since that time his follow-up studies have been stable.  Patient is scheduled to have a follow-up in 6 months.  3. Hyperlipidemia, mixed Continue statin as ordered and reviewed, no changes  at this time   4. Primary hypertension Continue antihypertensive medications as already ordered, these medications have been reviewed and there are no changes at this time.    Current Outpatient Medications on File Prior to Visit  Medication Sig Dispense Refill   allopurinol (ZYLOPRIM) 300 MG tablet Take 300 mg by mouth daily.     amLODipine (NORVASC) 2.5 MG tablet Take 2.5 mg by mouth daily.     aspirin EC 81 MG tablet Take 81 mg by mouth daily.      atorvastatin (LIPITOR) 20 MG tablet Take 20 mg by mouth daily.  11   Cholecalciferol (D3-1000) 25 MCG (1000 UT) tablet Take 1,000 Units by mouth daily.     diphenhydrAMINE (BENADRYL) 25 mg capsule Take 25 mg by mouth daily.     indomethacin (INDOCIN) 25 MG capsule Take 25 mg by mouth 2 (two) times daily with a meal.     lisinopril-hydrochlorothiazide (PRINZIDE,ZESTORETIC) 20-12.5 MG tablet Take 2 tablets by mouth daily.      metFORMIN (GLUCOPHAGE) 500 MG tablet Take 500 mg by mouth 2 (two) times daily.     metoprolol tartrate (LOPRESSOR) 25 MG tablet Take 25 mg by mouth 2 (two) times daily.     Multiple Vitamin (MULTI-VITAMINS) TABS Take 1 tablet by mouth daily.      pantoprazole (PROTONIX) 40 MG tablet Take 40 mg by mouth daily.     vitamin B-12 (CYANOCOBALAMIN) 1000 MCG tablet Take 1,000 mcg by mouth daily.     vitamin C (ASCORBIC ACID) 500 MG tablet Take 500 mg by mouth daily.     No current facility-administered medications on file prior to visit.    There are no Patient Instructions  on file for this visit. No follow-ups on file.   Kris Hartmann, NP

## 2022-03-01 ENCOUNTER — Other Ambulatory Visit: Payer: Self-pay

## 2022-03-01 ENCOUNTER — Emergency Department: Payer: Medicare Other

## 2022-03-01 ENCOUNTER — Inpatient Hospital Stay
Admission: EM | Admit: 2022-03-01 | Discharge: 2022-03-04 | DRG: 418 | Disposition: A | Discharge status: Home / Self Care | Payer: Medicare Other | Attending: Surgery | Admitting: Surgery

## 2022-03-01 DIAGNOSIS — E782 Mixed hyperlipidemia: Secondary | ICD-10-CM | POA: Diagnosis present

## 2022-03-01 DIAGNOSIS — Z8249 Family history of ischemic heart disease and other diseases of the circulatory system: Secondary | ICD-10-CM

## 2022-03-01 DIAGNOSIS — Z79899 Other long term (current) drug therapy: Secondary | ICD-10-CM

## 2022-03-01 DIAGNOSIS — Z7982 Long term (current) use of aspirin: Secondary | ICD-10-CM

## 2022-03-01 DIAGNOSIS — D696 Thrombocytopenia, unspecified: Secondary | ICD-10-CM | POA: Diagnosis present

## 2022-03-01 DIAGNOSIS — I10 Essential (primary) hypertension: Secondary | ICD-10-CM | POA: Diagnosis present

## 2022-03-01 DIAGNOSIS — K432 Incisional hernia without obstruction or gangrene: Secondary | ICD-10-CM | POA: Diagnosis present

## 2022-03-01 DIAGNOSIS — K819 Cholecystitis, unspecified: Principal | ICD-10-CM

## 2022-03-01 DIAGNOSIS — K8309 Other cholangitis: Secondary | ICD-10-CM | POA: Diagnosis present

## 2022-03-01 DIAGNOSIS — Z8673 Personal history of transient ischemic attack (TIA), and cerebral infarction without residual deficits: Secondary | ICD-10-CM

## 2022-03-01 DIAGNOSIS — K219 Gastro-esophageal reflux disease without esophagitis: Secondary | ICD-10-CM | POA: Diagnosis present

## 2022-03-01 DIAGNOSIS — Z87891 Personal history of nicotine dependence: Secondary | ICD-10-CM

## 2022-03-01 DIAGNOSIS — E1151 Type 2 diabetes mellitus with diabetic peripheral angiopathy without gangrene: Secondary | ICD-10-CM | POA: Diagnosis present

## 2022-03-01 DIAGNOSIS — I2510 Atherosclerotic heart disease of native coronary artery without angina pectoris: Secondary | ICD-10-CM | POA: Diagnosis present

## 2022-03-01 DIAGNOSIS — K810 Acute cholecystitis: Principal | ICD-10-CM | POA: Diagnosis present

## 2022-03-01 DIAGNOSIS — Z7984 Long term (current) use of oral hypoglycemic drugs: Secondary | ICD-10-CM

## 2022-03-01 DIAGNOSIS — K821 Hydrops of gallbladder: Secondary | ICD-10-CM | POA: Diagnosis present

## 2022-03-01 DIAGNOSIS — M109 Gout, unspecified: Secondary | ICD-10-CM | POA: Diagnosis present

## 2022-03-01 LAB — LIPASE, BLOOD: Lipase: 29 U/L (ref 11–51)

## 2022-03-01 LAB — COMPREHENSIVE METABOLIC PANEL
ALT: 47 U/L — ABNORMAL HIGH (ref 0–44)
AST: 34 U/L (ref 15–41)
Albumin: 4.1 g/dL (ref 3.5–5.0)
Alkaline Phosphatase: 52 U/L (ref 38–126)
Anion gap: 8 (ref 5–15)
BUN: 24 mg/dL — ABNORMAL HIGH (ref 8–23)
CO2: 29 mmol/L (ref 22–32)
Calcium: 9.7 mg/dL (ref 8.9–10.3)
Chloride: 103 mmol/L (ref 98–111)
Creatinine, Ser: 1.06 mg/dL (ref 0.61–1.24)
GFR, Estimated: >60 mL/min (ref >60)
Glucose, Bld: 181 mg/dL — ABNORMAL HIGH (ref 70–99)
Potassium: 4.3 mmol/L (ref 3.5–5.1)
Sodium: 140 mmol/L (ref 135–145)
Total Bilirubin: 0.8 mg/dL (ref 0.3–1.2)
Total Protein: 7.1 g/dL (ref 6.5–8.1)

## 2022-03-01 LAB — CBC
HCT: 43.9 % (ref 39.0–52.0)
Hemoglobin: 14.6 g/dL (ref 13.0–17.0)
MCH: 35.4 pg — ABNORMAL HIGH (ref 26.0–34.0)
MCHC: 33.3 g/dL (ref 30.0–36.0)
MCV: 106.3 fL — ABNORMAL HIGH (ref 80.0–100.0)
Platelets: 112 10*3/uL — ABNORMAL LOW (ref 150–400)
RBC: 4.13 MIL/uL — ABNORMAL LOW (ref 4.22–5.81)
RDW: 14.3 % (ref 11.5–15.5)
WBC: 9.5 10*3/uL (ref 4.0–10.5)
nRBC: 0 % (ref 0.0–0.2)

## 2022-03-01 LAB — TROPONIN I (HIGH SENSITIVITY): Troponin I (High Sensitivity): 10 ng/L (ref <18)

## 2022-03-01 MED ORDER — ONDANSETRON HCL 4 MG/2ML IJ SOLN
4.0000 mg | Freq: Once | INTRAMUSCULAR | Status: AC
  Administered 2022-03-01: 4 mg via INTRAVENOUS
  Filled 2022-03-01: qty 2

## 2022-03-01 MED ORDER — PIPERACILLIN-TAZOBACTAM 3.375 G IVPB 30 MIN
3.3750 g | Freq: Once | INTRAVENOUS | Status: AC
Start: 2022-03-01 — End: 2022-03-02
  Administered 2022-03-02: 3.375 g via INTRAVENOUS
  Filled 2022-03-01: qty 50

## 2022-03-01 MED ORDER — LACTATED RINGERS IV BOLUS
1000.0000 mL | Freq: Once | INTRAVENOUS | Status: AC
  Administered 2022-03-01: 1000 mL via INTRAVENOUS

## 2022-03-01 MED ORDER — IOHEXOL 300 MG/ML  SOLN
100.0000 mL | Freq: Once | INTRAMUSCULAR | Status: AC | PRN
  Administered 2022-03-01: 100 mL via INTRAVENOUS

## 2022-03-01 MED ORDER — MORPHINE SULFATE (PF) 4 MG/ML IV SOLN
4.0000 mg | Freq: Once | INTRAVENOUS | Status: AC
Start: 2022-03-01 — End: 2022-03-01
  Administered 2022-03-01: 4 mg via INTRAVENOUS
  Filled 2022-03-01: qty 1

## 2022-03-01 NOTE — ED Triage Notes (Signed)
Pt presents to ER c/o right side abd pain that began to radiate into his epigastric area today.  Pt states he has hx of previous abd hernia that has been repaired.  Pt states he began to have some diaphoresis when the epigastric pain started and was unsure if he was having indigestion.  Pt has hx of GERD.  Pt states pain has let up since coming here.  Pt A&O x4 at this time in NAD in triage.   ?

## 2022-03-01 NOTE — ED Provider Notes (Signed)
? ?[facility] ?Provider Note ? ? ? Event Date/Time  ? First MD Initiated Contact with Patient 03/01/22 2107   ?  (approximate) ? ? ?History  ? ?Chief Complaint ?Chest Pain and Abdominal Pain ? ? ?HPI ? ?Victor Rojas is a 76 y.o. male with past medical history of hypertension, hyperlipidemia, diabetes, peripheral vascular disease, and gout who presents to the ED complaining of abdominal pain.  Patient reports that earlier today he had acute onset of pain in the right lower quadrant of his abdomen.  He describes the pain as sharp and constant, has since migrated upwards towards his epigastric area.  Pain has not been exacerbated or alleviated by anything in particular, he has been feeling nauseous with it but has not vomited.  He denies any pain moving upwards into his chest and has not had any cough or shortness of breath.  He denies any changes in his bowel movements recently and has not had any dysuria or hematuria.  He does report prior umbilical hernia surgery and is concerned that this could have caused his problems. ?  ? ? ?Physical Exam  ? ?Triage Vital Signs: ?ED Triage Vitals  ?Enc Vitals Group  ?   BP 03/01/22 2101 (!) 184/80  ?   Pulse Rate 03/01/22 2101 71  ?   Resp 03/01/22 2101 18  ?   Temp 03/01/22 2101 97.7 ?F (36.5 ?C)  ?   Temp Source 03/01/22 2101 Oral  ?   SpO2 03/01/22 2101 97 %  ?   Weight 03/01/22 2100 208 lb (94.3 kg)  ?   Height 03/01/22 2100 '5\' 8"'$  (1.727 m)  ?   Head Circumference --   ?   Peak Flow --   ?   Pain Score 03/01/22 2059 5  ?   Pain Loc --   ?   Pain Edu? --   ?   Excl. in [place]? --   ? ? ?Most recent vital signs: ?Vitals:  ? 03/01/22 2101 03/01/22 2223  ?BP: (!) 184/80 (!) 160/59  ?Pulse: 71 77  ?Resp: 18 18  ?Temp: 97.7 ?F (36.5 ?C)   ?SpO2: 97% 97%  ? ? ?Constitutional: Alert and oriented. ?Eyes: Conjunctivae are normal. ?Head: Atraumatic. ?Nose: No congestion/rhinnorhea. ?Mouth/Throat: Mucous membranes are moist.  ?Cardiovascular: Normal rate, regular  rhythm. Grossly normal heart sounds.  2+ radial pulses bilaterally. ?Respiratory: Normal respiratory effort.  No retractions. Lungs CTAB. ?Gastrointestinal: Soft and diffusely tender to palpation, greatest in the right upper and lower quadrants with no rebound or guarding.  Abdomen mildly distended. ?Musculoskeletal: No lower extremity tenderness nor edema.  ?Neurologic:  Normal speech and language. No gross focal neurologic deficits are appreciated. ? ? ? ?ED Results / Procedures / Treatments  ? ?Labs ?(all labs ordered are listed, but only abnormal results are displayed) ?Labs Reviewed  ?CBC - Abnormal; Notable for the following components:  ?    Result Value  ? RBC 4.13 (*)   ? MCV 106.3 (*)   ? MCH 35.4 (*)   ? Platelets 112 (*)   ? All other components within normal limits  ?COMPREHENSIVE METABOLIC PANEL - Abnormal; Notable for the following components:  ? Glucose, Bld 181 (*)   ? BUN 24 (*)   ? ALT 47 (*)   ? All other components within normal limits  ?LIPASE, BLOOD  ?TROPONIN I (HIGH SENSITIVITY)  ? ? ? ?EKG ? ?ED ECG REPORT ?Tempie Hoist, the attending physician, personally viewed  and interpreted this ECG. ? ? Date: 03/01/2022 ? EKG Time: 20:58 ? Rate: 79 ? Rhythm: normal sinus rhythm, PAC's noted, occasional PVC noted, unifocal ? Axis: Normal ? Intervals:none ? ST&T Change: None ? ?RADIOLOGY ?CT scan reviewed by me with distended gallbladder and small amount of pericholecystic fluid, no distended bowel loops noted. ? ?PROCEDURES: ? ?Critical Care performed: No ? ?.1-3 Lead EKG Interpretation ?Performed by: Blake Divine, MD ?Authorized by: Blake Divine, MD  ? ?  Interpretation: abnormal   ?  ECG rate:  65-80 ?  ECG rate assessment: normal   ?  Rhythm: sinus rhythm   ?  Ectopy: PAC and PVCs   ?  Conduction: normal   ? ? ?MEDICATIONS ORDERED IN ED: ?Medications  ?piperacillin-tazobactam (ZOSYN) IVPB 3.375 g (has no administration in time range)  ?morphine (PF) 4 MG/ML injection 4 mg (4 mg  Intravenous Given 03/01/22 2219)  ?ondansetron [facility]) injection 4 mg (4 mg Intravenous Given 03/01/22 2217)  ?lactated ringers bolus 1,000 mL (1,000 mLs Intravenous New Bag/Given 03/01/22 2223)  ?iohexol (OMNIPAQUE) 300 MG/ML solution 100 mL (100 mLs Intravenous Contrast Given 03/01/22 2241)  ? ? ? ?IMPRESSION / MDM / ASSESSMENT AND PLAN / ED COURSE  ?I reviewed the triage vital signs and the nursing notes. ?             ?               ? ?76 y.o. male with past medical history of hypertension, hyperlipidemia, diabetes, peripheral vascular disease, and gout who presents to the ED with a few hours of diffuse abdominal pain and nausea. ? ?Differential diagnosis includes, but is not limited to, recurrent umbilical hernia, bowel obstruction, appendicitis, cholecystitis, biliary colic, pancreatitis, gastritis, GERD, ACS. ? ?Patient nontoxic-appearing and in no acute distress, vital signs are reassuring but patient does have significant abdominal tenderness over the right side of his abdomen with mild distention.  He does not have obvious recurrence of his umbilical hernia but we will further assess with CT scan for hernia, bowel obstruction, appendicitis, or biliary process.  Low suspicion for cardiac etiology for his symptoms, EKG shows frequent PACs and PVC but with no ischemic changes.  Troponin as well as abdominal labs are pending.  We will treat symptomatically with IV morphine and Zofran, hydrate with IV fluids. ? ?The patient is on the cardiac monitor to evaluate for evidence of arrhythmia and/or significant heart rate changes. ? ?CT scan is concerning for acute cholecystitis with distended gallbladder and small amount of pericholecystic fluid.  CT is also remarkable for significant vascular disease however I have low suspicion for mesenteric ischemia at this time, pain improved following dose of IV morphine.  Labs are unremarkable, LFTs and lipase within normal limits, CMP without electrolyte abnormality or AKI.   Case discussed with Dr. Dahlia Byes of general surgery, who will plan for admission and likely cholecystectomy tomorrow.  Plan to treat with IV Zosyn for now. ? ?  ? ? ?FINAL CLINICAL IMPRESSION(S) / ED DIAGNOSES  ? ?Final diagnoses:  ?Cholecystitis  ? ? ? ?Rx / DC Orders  ? ?ED Discharge Orders   ? ? None  ? ?  ? ? ? ?Note:  This document was prepared using Dragon voice recognition software and may include unintentional dictation errors. ?  ?Blake Divine, MD ?03/01/22 2316 ? ?

## 2022-03-01 NOTE — Progress Notes (Signed)
76 yo HTN, PVD, DM on ASA presents w Acute onset of Right abdominal pain now localized epigastric area. CT pers. Reviewed c/w cholecystitis. ?D/W Dr. Charna Archer in detail we will admit start ivf a/bs and schedule for rob cholecystectomy tomorrow as OR availability allows. Per Dr. Charna Archer EKG and trop negative. ?

## 2022-03-01 NOTE — ED Notes (Signed)
Patient transported to Ultrasound 

## 2022-03-02 ENCOUNTER — Other Ambulatory Visit: Payer: Self-pay

## 2022-03-02 ENCOUNTER — Encounter
Admission: EM | Disposition: A | Discharge status: Home / Self Care | Payer: Self-pay | Source: Home / Self Care | Attending: Surgery

## 2022-03-02 ENCOUNTER — Observation Stay: Payer: Medicare Other | Admitting: Anesthesiology

## 2022-03-02 DIAGNOSIS — K810 Acute cholecystitis: Secondary | ICD-10-CM | POA: Diagnosis not present

## 2022-03-02 HISTORY — PX: ROBOTIC ASSISTED LAPAROSCOPIC CHOLECYSTECTOMY: SHX6521

## 2022-03-02 LAB — CBC
HCT: 40.9 % (ref 39.0–52.0)
Hemoglobin: 13.7 g/dL (ref 13.0–17.0)
MCH: 34.9 pg — ABNORMAL HIGH (ref 26.0–34.0)
MCHC: 33.5 g/dL (ref 30.0–36.0)
MCV: 104.1 fL — ABNORMAL HIGH (ref 80.0–100.0)
Platelets: 103 10*3/uL — ABNORMAL LOW (ref 150–400)
RBC: 3.93 MIL/uL — ABNORMAL LOW (ref 4.22–5.81)
RDW: 13.9 % (ref 11.5–15.5)
WBC: 12.4 10*3/uL — ABNORMAL HIGH (ref 4.0–10.5)
nRBC: 0 % (ref 0.0–0.2)

## 2022-03-02 LAB — GLUCOSE, CAPILLARY
Glucose-Capillary: 158 mg/dL — ABNORMAL HIGH (ref 70–99)
Glucose-Capillary: 231 mg/dL — ABNORMAL HIGH (ref 70–99)
Glucose-Capillary: 280 mg/dL — ABNORMAL HIGH (ref 70–99)

## 2022-03-02 LAB — HEMOGLOBIN A1C
Hgb A1c MFr Bld: 6.4 % — ABNORMAL HIGH (ref 4.8–5.6)
Mean Plasma Glucose: 136.98 mg/dL

## 2022-03-02 SURGERY — CHOLECYSTECTOMY, ROBOT-ASSISTED, LAPAROSCOPIC
Anesthesia: General

## 2022-03-02 MED ORDER — PROCHLORPERAZINE MALEATE 10 MG PO TABS
10.0000 mg | ORAL_TABLET | Freq: Four times a day (QID) | ORAL | Status: DC | PRN
  Filled 2022-03-02: qty 1

## 2022-03-02 MED ORDER — PANTOPRAZOLE SODIUM 40 MG IV SOLR
40.0000 mg | Freq: Two times a day (BID) | INTRAVENOUS | Status: DC
  Administered 2022-03-02 – 2022-03-04 (×6): 40 mg via INTRAVENOUS
  Filled 2022-03-02 (×6): qty 10

## 2022-03-02 MED ORDER — HYDRALAZINE HCL 20 MG/ML IJ SOLN: 10.0000 mg | INTRAMUSCULAR | Status: DC | PRN

## 2022-03-02 MED ORDER — ONDANSETRON HCL 4 MG/2ML IJ SOLN
INTRAMUSCULAR | Status: DC | PRN
Start: 2022-03-02 — End: 2022-03-02
  Administered 2022-03-02: 4 mg via INTRAVENOUS

## 2022-03-02 MED ORDER — METOPROLOL TARTRATE 25 MG PO TABS
25.0000 mg | ORAL_TABLET | Freq: Two times a day (BID) | ORAL | Status: DC
  Administered 2022-03-02 – 2022-03-04 (×6): 25 mg via ORAL
  Filled 2022-03-02 (×6): qty 1

## 2022-03-02 MED ORDER — OXYCODONE HCL 5 MG PO TABS: 5.0000 mg | ORAL_TABLET | ORAL | Status: DC | PRN

## 2022-03-02 MED ORDER — DEXMEDETOMIDINE (PRECEDEX) IN NS 20 MCG/5ML (4 MCG/ML) IV SYRINGE
PREFILLED_SYRINGE | INTRAVENOUS | Status: DC | PRN
  Administered 2022-03-02: 12 ug via INTRAVENOUS

## 2022-03-02 MED ORDER — BUPIVACAINE-EPINEPHRINE (PF) 0.25% -1:200000 IJ SOLN
INTRAMUSCULAR | Status: AC
  Filled 2022-03-02: qty 30

## 2022-03-02 MED ORDER — DEXMEDETOMIDINE HCL IN NACL 200 MCG/50ML IV SOLN
INTRAVENOUS | Status: AC
  Filled 2022-03-02: qty 50

## 2022-03-02 MED ORDER — DIPHENHYDRAMINE HCL 12.5 MG/5ML PO ELIX
12.5000 mg | ORAL_SOLUTION | Freq: Four times a day (QID) | ORAL | Status: DC | PRN
  Filled 2022-03-02: qty 5

## 2022-03-02 MED ORDER — PIPERACILLIN-TAZOBACTAM 3.375 G IVPB
3.3750 g | Freq: Three times a day (TID) | INTRAVENOUS | Status: DC
  Administered 2022-03-02 – 2022-03-04 (×7): 3.375 g via INTRAVENOUS
  Filled 2022-03-02 (×7): qty 50

## 2022-03-02 MED ORDER — INDOCYANINE GREEN 25 MG IV SOLR
2.5000 mg | INTRAVENOUS | Status: AC
  Administered 2022-03-02: 2.5 mg via INTRAVENOUS
  Filled 2022-03-02: qty 1

## 2022-03-02 MED ORDER — MORPHINE SULFATE (PF) 4 MG/ML IV SOLN
4.0000 mg | INTRAVENOUS | Status: DC | PRN
  Administered 2022-03-02 (×2): 4 mg via INTRAVENOUS
  Filled 2022-03-02 (×2): qty 1

## 2022-03-02 MED ORDER — ROCURONIUM BROMIDE 100 MG/10ML IV SOLN
INTRAVENOUS | Status: DC | PRN
Start: 2022-03-02 — End: 2022-03-02
  Administered 2022-03-02: 50 mg via INTRAVENOUS
  Administered 2022-03-02 (×2): 20 mg via INTRAVENOUS

## 2022-03-02 MED ORDER — PHENYLEPHRINE 40 MCG/ML (10ML) SYRINGE FOR IV PUSH (FOR BLOOD PRESSURE SUPPORT)
PREFILLED_SYRINGE | INTRAVENOUS | Status: AC
  Filled 2022-03-02: qty 10

## 2022-03-02 MED ORDER — ACETAMINOPHEN 500 MG PO TABS
1000.0000 mg | ORAL_TABLET | Freq: Four times a day (QID) | ORAL | Status: DC
  Administered 2022-03-02 – 2022-03-04 (×6): 1000 mg via ORAL
  Filled 2022-03-02 (×7): qty 2

## 2022-03-02 MED ORDER — ACETAMINOPHEN 10 MG/ML IV SOLN
INTRAVENOUS | Status: DC | PRN
  Administered 2022-03-02: 1000 mg via INTRAVENOUS

## 2022-03-02 MED ORDER — EPHEDRINE 5 MG/ML INJ
INTRAVENOUS | Status: AC
  Filled 2022-03-02: qty 5

## 2022-03-02 MED ORDER — PROCHLORPERAZINE EDISYLATE 10 MG/2ML IJ SOLN: 5.0000 mg | Freq: Four times a day (QID) | INTRAMUSCULAR | Status: DC | PRN

## 2022-03-02 MED ORDER — AMLODIPINE BESYLATE 5 MG PO TABS
2.5000 mg | ORAL_TABLET | Freq: Every day | ORAL | Status: DC
  Administered 2022-03-03 – 2022-03-04 (×2): 2.5 mg via ORAL
  Filled 2022-03-02 (×2): qty 1

## 2022-03-02 MED ORDER — PROPOFOL 10 MG/ML IV BOLUS
INTRAVENOUS | Status: DC | PRN
  Administered 2022-03-02: 160 mg via INTRAVENOUS
  Administered 2022-03-02: 40 mg via INTRAVENOUS

## 2022-03-02 MED ORDER — LISINOPRIL-HYDROCHLOROTHIAZIDE 20-12.5 MG PO TABS: 2.0000 | ORAL_TABLET | Freq: Every day | ORAL | Status: DC

## 2022-03-02 MED ORDER — PHENYLEPHRINE 40 MCG/ML (10ML) SYRINGE FOR IV PUSH (FOR BLOOD PRESSURE SUPPORT)
PREFILLED_SYRINGE | INTRAVENOUS | Status: DC | PRN
  Administered 2022-03-02: 80 ug via INTRAVENOUS

## 2022-03-02 MED ORDER — OXYCODONE HCL 5 MG/5ML PO SOLN: 5.0000 mg | Freq: Once | ORAL | Status: DC | PRN

## 2022-03-02 MED ORDER — FENTANYL CITRATE (PF) 100 MCG/2ML IJ SOLN
INTRAMUSCULAR | Status: DC | PRN
  Administered 2022-03-02: 50 ug via INTRAVENOUS
  Administered 2022-03-02 (×2): 25 ug via INTRAVENOUS

## 2022-03-02 MED ORDER — BUPIVACAINE LIPOSOME 1.3 % IJ SUSP
INTRAMUSCULAR | Status: AC
  Filled 2022-03-02: qty 20

## 2022-03-02 MED ORDER — LIDOCAINE HCL (PF) 2 % IJ SOLN
INTRAMUSCULAR | Status: AC
  Filled 2022-03-02: qty 5

## 2022-03-02 MED ORDER — INSULIN ASPART 100 UNIT/ML IJ SOLN
0.0000 [IU] | Freq: Every day | INTRAMUSCULAR | Status: DC
  Administered 2022-03-02: 3 [IU] via SUBCUTANEOUS
  Filled 2022-03-02: qty 1

## 2022-03-02 MED ORDER — BUPIVACAINE LIPOSOME 1.3 % IJ SUSP
INTRAMUSCULAR | Status: DC | PRN
  Administered 2022-03-02: 20 mL

## 2022-03-02 MED ORDER — KETOROLAC TROMETHAMINE 15 MG/ML IJ SOLN
15.0000 mg | Freq: Four times a day (QID) | INTRAMUSCULAR | Status: DC | PRN
  Administered 2022-03-02: 15 mg via INTRAVENOUS
  Filled 2022-03-02: qty 1

## 2022-03-02 MED ORDER — HYDROCHLOROTHIAZIDE 12.5 MG PO TABS
12.5000 mg | ORAL_TABLET | Freq: Every day | ORAL | Status: DC
  Administered 2022-03-03 – 2022-03-04 (×2): 12.5 mg via ORAL
  Filled 2022-03-02 (×2): qty 1

## 2022-03-02 MED ORDER — ONDANSETRON HCL 4 MG/2ML IJ SOLN: 4.0000 mg | Freq: Four times a day (QID) | INTRAMUSCULAR | Status: DC | PRN

## 2022-03-02 MED ORDER — DIPHENHYDRAMINE HCL 50 MG/ML IJ SOLN
12.5000 mg | Freq: Four times a day (QID) | INTRAMUSCULAR | Status: DC | PRN
  Administered 2022-03-03: 12.5 mg via INTRAVENOUS
  Filled 2022-03-02: qty 1

## 2022-03-02 MED ORDER — SUGAMMADEX SODIUM 200 MG/2ML IV SOLN
INTRAVENOUS | Status: DC | PRN
  Administered 2022-03-02: 187.4 mg via INTRAVENOUS

## 2022-03-02 MED ORDER — OXYCODONE HCL 5 MG PO TABS: 5.0000 mg | ORAL_TABLET | Freq: Once | ORAL | Status: DC | PRN

## 2022-03-02 MED ORDER — ENOXAPARIN SODIUM 60 MG/0.6ML IJ SOSY: 0.5000 mg/kg | PREFILLED_SYRINGE | INTRAMUSCULAR | Status: DC

## 2022-03-02 MED ORDER — SODIUM CHLORIDE 0.9 % IV SOLN: INTRAVENOUS | Status: DC

## 2022-03-02 MED ORDER — FENTANYL CITRATE (PF) 100 MCG/2ML IJ SOLN
INTRAMUSCULAR | Status: AC
Start: 2022-03-02 — End: ?
  Filled 2022-03-02: qty 2

## 2022-03-02 MED ORDER — FENTANYL CITRATE (PF) 100 MCG/2ML IJ SOLN: 25.0000 ug | INTRAMUSCULAR | Status: DC | PRN

## 2022-03-02 MED ORDER — SODIUM CHLORIDE 0.9 % IV SOLN
INTRAVENOUS | Status: DC | PRN
Start: 2022-03-02 — End: 2022-03-04

## 2022-03-02 MED ORDER — BUPIVACAINE-EPINEPHRINE (PF) 0.25% -1:200000 IJ SOLN
INTRAMUSCULAR | Status: DC | PRN
  Administered 2022-03-02: 30 mL via PERINEURAL

## 2022-03-02 MED ORDER — ACETAMINOPHEN 10 MG/ML IV SOLN
INTRAVENOUS | Status: AC
  Filled 2022-03-02: qty 100

## 2022-03-02 MED ORDER — EPHEDRINE SULFATE (PRESSORS) 50 MG/ML IJ SOLN
INTRAMUSCULAR | Status: DC | PRN
  Administered 2022-03-02: 5 mg via INTRAVENOUS
  Administered 2022-03-02: 10 mg via INTRAVENOUS

## 2022-03-02 MED ORDER — DEXAMETHASONE SODIUM PHOSPHATE 10 MG/ML IJ SOLN
INTRAMUSCULAR | Status: DC | PRN
  Administered 2022-03-02: 10 mg via INTRAVENOUS

## 2022-03-02 MED ORDER — LISINOPRIL 20 MG PO TABS
20.0000 mg | ORAL_TABLET | Freq: Every day | ORAL | Status: DC
  Administered 2022-03-03 – 2022-03-04 (×2): 20 mg via ORAL
  Filled 2022-03-02 (×2): qty 1

## 2022-03-02 MED ORDER — INSULIN ASPART 100 UNIT/ML IJ SOLN
4.0000 [IU] | Freq: Three times a day (TID) | INTRAMUSCULAR | Status: DC
  Administered 2022-03-02 – 2022-03-04 (×7): 4 [IU] via SUBCUTANEOUS
  Filled 2022-03-02 (×7): qty 1

## 2022-03-02 MED ORDER — INSULIN ASPART 100 UNIT/ML IJ SOLN
0.0000 [IU] | Freq: Three times a day (TID) | INTRAMUSCULAR | Status: DC
  Administered 2022-03-02: 3 [IU] via SUBCUTANEOUS
  Administered 2022-03-02: 5 [IU] via SUBCUTANEOUS
  Administered 2022-03-03 (×3): 3 [IU] via SUBCUTANEOUS
  Administered 2022-03-04 (×2): 2 [IU] via SUBCUTANEOUS
  Filled 2022-03-02 (×7): qty 1

## 2022-03-02 MED ORDER — ONDANSETRON 4 MG PO TBDP: 4.0000 mg | ORAL_TABLET | Freq: Four times a day (QID) | ORAL | Status: DC | PRN

## 2022-03-02 MED ORDER — LIDOCAINE HCL (CARDIAC) PF 100 MG/5ML IV SOSY
PREFILLED_SYRINGE | INTRAVENOUS | Status: DC | PRN
Start: 2022-03-02 — End: 2022-03-02
  Administered 2022-03-02: 50 mg via INTRAVENOUS
  Administered 2022-03-02: 100 mg via INTRAVENOUS

## 2022-03-02 MED ORDER — PROPOFOL 10 MG/ML IV BOLUS
INTRAVENOUS | Status: AC
  Filled 2022-03-02: qty 20

## 2022-03-02 SURGICAL SUPPLY — 60 items
BAG RETRIEVAL 10 (BASKET) ×1
BULB RESERV EVAC DRAIN JP 100C (MISCELLANEOUS) ×1 IMPLANT
CANNULA REDUC XI 12-8 STAPL (CANNULA) ×1
CANNULA REDUCER 12-8 DVNC XI (CANNULA) ×1 IMPLANT
CATH REDDICK CHOLANGI 4FR 50CM (CATHETERS) IMPLANT
CLIP LIGATING HEMO O LOK GREEN (MISCELLANEOUS) ×2 IMPLANT
DERMABOND ADVANCED (GAUZE/BANDAGES/DRESSINGS) ×1
DERMABOND ADVANCED .7 DNX12 (GAUZE/BANDAGES/DRESSINGS) ×1 IMPLANT
DRAIN CHANNEL 19F RND (DRAIN) ×1 IMPLANT
DRAPE ARM DVNC X/XI (DISPOSABLE) ×4 IMPLANT
DRAPE COLUMN DVNC XI (DISPOSABLE) ×1 IMPLANT
DRAPE DA VINCI XI ARM (DISPOSABLE) ×4
DRAPE DA VINCI XI COLUMN (DISPOSABLE) ×1
ELECT CAUTERY BLADE 6.4 (BLADE) ×2 IMPLANT
ELECT REM PT RETURN 9FT ADLT (ELECTROSURGICAL) ×2
ELECTRODE REM PT RTRN 9FT ADLT (ELECTROSURGICAL) ×1 IMPLANT
GLOVE SURG ENC MOIS LTX SZ7 (GLOVE) ×6 IMPLANT
GOWN STRL REUS W/ TWL LRG LVL3 (GOWN DISPOSABLE) ×4 IMPLANT
GOWN STRL REUS W/TWL LRG LVL3 (GOWN DISPOSABLE) ×3
HEMOSTAT SURGICEL 2X3 (HEMOSTASIS) ×1 IMPLANT
IRRIGATION STRYKERFLOW (MISCELLANEOUS) IMPLANT
IRRIGATOR STRYKERFLOW (MISCELLANEOUS) ×2
IV CATH ANGIO 12GX3 LT BLUE (NEEDLE) IMPLANT
IV NS 1000ML (IV SOLUTION) ×1
IV NS 1000ML BAXH (IV SOLUTION) IMPLANT
KIT PINK PAD W/HEAD ARE REST (MISCELLANEOUS) ×2 IMPLANT
KIT PINK PAD W/HEAD ARM REST (MISCELLANEOUS) ×1 IMPLANT
LABEL OR SOLS (LABEL) ×2 IMPLANT
MANIFOLD NEPTUNE II (INSTRUMENTS) ×2 IMPLANT
NEEDLE HYPO 22GX1.5 SAFETY (NEEDLE) ×2 IMPLANT
NS IRRIG 500ML POUR BTL (IV SOLUTION) ×2 IMPLANT
OBTURATOR OPTICAL STANDARD 8MM (TROCAR) ×1
OBTURATOR OPTICAL STND 8 DVNC (TROCAR) ×1
OBTURATOR OPTICALSTD 8 DVNC (TROCAR) ×1 IMPLANT
PACK LAP CHOLECYSTECTOMY (MISCELLANEOUS) ×2 IMPLANT
PENCIL ELECTRO HAND CTR (MISCELLANEOUS) ×2 IMPLANT
SEAL CANN UNIV 5-8 DVNC XI (MISCELLANEOUS) ×3 IMPLANT
SEAL XI 5MM-8MM UNIVERSAL (MISCELLANEOUS) ×3
SET TUBE SMOKE EVAC HIGH FLOW (TUBING) ×2 IMPLANT
SOLUTION ELECTROLUBE (MISCELLANEOUS) ×2 IMPLANT
SPIKE FLUID TRANSFER (MISCELLANEOUS) ×2 IMPLANT
SPONGE DRAIN TRACH 4X4 STRL 2S (GAUZE/BANDAGES/DRESSINGS) ×1 IMPLANT
SPONGE T-LAP 18X18 ~~LOC~~+RFID (SPONGE) ×2 IMPLANT
SPONGE T-LAP 4X18 ~~LOC~~+RFID (SPONGE) IMPLANT
STAPLER CANNULA SEAL DVNC XI (STAPLE) ×1 IMPLANT
STAPLER CANNULA SEAL XI (STAPLE) ×1
STOPCOCK 3 WAY MALE LL (IV SETS)
STOPCOCK 3WAY MALE LL (IV SETS) IMPLANT
SUT ETHILON 3-0 FS-10 30 BLK (SUTURE) ×2
SUT MNCRL AB 4-0 PS2 18 (SUTURE) ×2 IMPLANT
SUT VICRYL 0 AB UR-6 (SUTURE) ×3 IMPLANT
SUTURE EHLN 3-0 FS-10 30 BLK (SUTURE) IMPLANT
SYR 20ML LL LF (SYRINGE) ×2 IMPLANT
SYR 30ML LL (SYRINGE) ×2 IMPLANT
SYS BAG RETRIEVAL 10MM (BASKET) ×1
SYSTEM BAG RETRIEVAL 10MM (BASKET) ×1 IMPLANT
TAPE TRANSPORE STRL 2 31045 (GAUZE/BANDAGES/DRESSINGS) ×2 IMPLANT
TROCAR BALLN GELPORT 12X130M (ENDOMECHANICALS) ×2 IMPLANT
WATER STERILE IRR 3000ML UROMA (IV SOLUTION) ×2 IMPLANT
WATER STERILE IRR 500ML POUR (IV SOLUTION) ×2 IMPLANT

## 2022-03-02 NOTE — Op Note (Signed)
Robotic assisted laparoscopic Cholecystectomy ? ?Pre-operative Diagnosis: Acute cholecystitis ? ?Post-operative Diagnosis: same ? ?Procedure:  ?Robotic assisted laparoscopic Cholecystectomy ? ?Surgeon: Caroleen Hamman, MD FACS ? ?Anesthesia: Gen. with endotracheal tube ? ?Findings: ?Severe Acute Cholecystitis with Hydrops and Empyema of the Gallbladder ?Very friable liver due to inflammatory response ?Oozy, likely from inflammation and Thrombocytopenia, we will hold off on lovenox  ?Recurrent incisional hernia with shrink mesh and left lateral recurrence . I did not repair it given acute severe infection. ? ?Estimated Blood Loss: 100cc ?      ?Specimens: Gallbladder     ?      ?Complications: none ? ? ?Procedure Details  ?The patient was seen again in the Holding Room. The benefits, complications, treatment options, and expected outcomes were discussed with the patient. The risks of bleeding, infection, recurrence of symptoms, failure to resolve symptoms, bile duct damage, bile duct leak, retained common bile duct stone, bowel injury, any of which could require further surgery and/or ERCP, stent, or papillotomy were reviewed with the patient. The likelihood of improving the patient's symptoms with return to their baseline status is good.  The patient and/or family concurred with the proposed plan, giving informed consent.  The patient was taken to Operating Room, identified  and the procedure verified as Laparoscopic Cholecystectomy. ? A Time Out was held and the above information confirmed. ? ?Prior to the induction of general anesthesia, antibiotic prophylaxis was administered. VTE prophylaxis was in place. General endotracheal anesthesia was then administered and tolerated well. After the induction, the abdomen was prepped with Chloraprep and draped in the sterile fashion. The patient was positioned in the supine position. ? ?Cut down technique was used to enter the abdominal cavity and a Hasson trochar was placed  after two vicryl stitches were anchored to the fascia. I placed my finger and palpated a hernia recurrence and prior mesh contracted into the hernia defect. ? ? ?Pneumoperitoneum was then created with CO2 and tolerated well without any adverse changes in the patient's vital signs.  Three 8-mm ports were placed under direct vision. All skin incisions  were infiltrated with a local anesthetic agent before making the incision and placing the trocars.  ? ?The patient was positioned  in reverse Trendelenburg, robot was brought to the surgical field and docked in the standard fashion.  We made sure all the instrumentation was kept indirect view at all times and that there were no collision between the arms. ?I scrubbed out and went to the console. ? ?The gallbladder was identified, the fundus grasped and retracted cephalad. Adhesions were lysed bluntly. Severe inflammatory response. With friable liver The infundibulum was grasped and retracted laterally, exposing the peritoneum overlying the triangle of Calot. This was then divided and exposed in a blunt fashion. An extended critical view of the cystic duct and cystic artery was obtained.  The cystic duct was clearly identified and bluntly dissected.   Artery and duct were double clipped and divided. ?Using ICG cholangiography we visualize the cystic duct and so no a Baron biliary ductal anatomy or evidence of bile injuries. ?The gallbladder was taken from the gallbladder fossa in a retrograde fashion with the electrocautery.  ?Please note his tissues were oozy from inflammatory response and thrombocytopenia ?Hemostasis was achieved with the electrocautery. nspection of the right upper quadrant was performed. No bleeding, bile duct injury or leak, or bowel injury was noted. ?Robotic instruments and robotic arms were undocked in the standard fashion.  I scrubbed back in. ?A 19  blake drain was placed in the GB fossa. ?The gallbladder was removed and placed in an Endocatch  bag.  ? ?Pneumoperitoneum was released.  The periumbilical port site was closed with interrumpted 0 Vicryl sutures. 4-0 subcuticular Monocryl was used to close the skin. Dermabond was  applied.  The patient was then extubated and brought to the recovery room in stable condition. Sponge, lap, and needle counts were correct at closure and at the conclusion of the case.  ?        ?     ?Caroleen Hamman, MD, FACS  ?

## 2022-03-02 NOTE — Plan of Care (Signed)
  Problem: Health Behavior/Discharge Planning: Goal: Ability to manage health-related needs will improve Outcome: Progressing   Problem: Clinical Measurements: Goal: Diagnostic test results will improve Outcome: Progressing   

## 2022-03-02 NOTE — Anesthesia Procedure Notes (Addendum)
Procedure Name: Intubation ?Date/Time: 03/02/2022 10:10 AM ?Performed by: Doreen Salvage, CRNA ?Pre-anesthesia Checklist: Patient identified, Emergency Drugs available, Suction available and Patient being monitored ?Patient Re-evaluated:Patient Re-evaluated prior to induction ?Oxygen Delivery Method: Circle system utilized ?Preoxygenation: Pre-oxygenation with 100% oxygen ?Induction Type: IV induction and Rapid sequence ?Laryngoscope Size: Mac, 4 and McGraph ?Grade View: Grade I ?Tube type: Oral ?Tube size: 7.5 mm ?Number of attempts: 1 ?Airway Equipment and Method: Stylet ?Placement Confirmation: ETT inserted through vocal cords under direct vision, positive ETCO2 and breath sounds checked- equal and bilateral ?Secured at: 23 cm ?Tube secured with: Tape ?Dental Injury: Teeth and Oropharynx as per pre-operative assessment  ? ? ? ? ?

## 2022-03-02 NOTE — Transfer of Care (Signed)
Immediate Anesthesia Transfer of Care Note ? ?Patient: Victor Rojas ? ?Procedure(s) Performed: XI ROBOTIC ASSISTED LAPAROSCOPIC CHOLECYSTECTOMY ?INDOCYANINE GREEN FLUORESCENCE IMAGING (ICG) ? ?Patient Location: PACU ? ?Anesthesia Type:General ? ?Level of Consciousness: awake and alert  ? ?Airway & Oxygen Therapy: Patient Spontanous Breathing and Patient connected to face mask oxygen ? ?Post-op Assessment: Report given to RN and Post -op Vital signs reviewed and stable ? ?Post vital signs: Reviewed and stable ? ?Last Vitals:  ?Vitals Value Taken Time  ?BP    ?Temp    ?Pulse    ?Resp    ?SpO2    ? ? ?Last Pain:  ?Vitals:  ? 03/02/22 0907  ?TempSrc: Oral  ?PainSc: 2   ?   ? ?  ? ?Complications: No notable events documented. ?

## 2022-03-02 NOTE — Discharge Instructions (Signed)
In addition to included general post-operative instructions, ? ?Diet: Resume home diet. Recommend avoiding or limiting fatty/greasy foods over the next few days/week. If you do eat these, you may (or may not) notice diarrhea. This is expected while your body adjusts to not having a gallbladder, and it typically resolves with time.   ? ?Activity: No heavy lifting >20 pounds (children, pets, laundry, garbage) for 4 weeks, but light activity and walking are encouraged. Do not drive or drink alcohol if taking narcotic pain medications or having pain that might distract from driving. ? ?Wound care: 2 days after surgery (03/26), you may shower/get incision wet with soapy water and pat dry (do not rub incisions), but no baths or submerging incision underwater until follow-up.  ? ?Medications: Resume all home medications. For mild to moderate pain: acetaminophen (Tylenol) or ibuprofen/naproxen (if no kidney disease). Combining Tylenol with alcohol can substantially increase your risk of causing liver disease. Narcotic pain medications, if prescribed, can be used for severe pain, though may cause nausea, constipation, and drowsiness. Do not combine Tylenol and Percocet (or similar) within a 6 hour period as Percocet (and similar) contain(s) Tylenol. If you do not need the narcotic pain medication, you do not need to fill the prescription. ? ?Call office [phone] / [phone]) at any time if any questions, worsening pain, fevers/chills, bleeding, drainage from incision site, or other concerns. ? ?

## 2022-03-02 NOTE — Progress Notes (Signed)
PHARMACIST - PHYSICIAN COMMUNICATION ? ?CONCERNING:  Enoxaparin (Lovenox) for DVT Prophylaxis  ? ? ?RECOMMENDATION: ?Patient was prescribed enoxaprin '40mg'$  q24 hours for VTE prophylaxis.  ? ?Filed Weights  ? 03/01/22 2100 03/02/22 0028  ?Weight: 94.3 kg (208 lb) 93.7 kg (206 lb 9.1 oz)  ? ? ?Body mass index is 31.41 kg/m?. ? ?Estimated Creatinine Clearance: 66.9 mL/min (by C-G formula based on SCr of 1.06 mg/dL). ? ? ?Based on [place] patient is candidate for enoxaparin 0.'5mg'$ /kg TBW SQ every 24 hours based on BMI being >30. ? ? ?DESCRIPTION: ?Pharmacy has adjusted enoxaparin dose per [facility] policy. ? ?Patient is now receiving enoxaparin 0.5 mg/kg every 24 hours  ? ?Renda Rolls, PharmD, MBA ?03/02/2022 ?12:50 AM ? ?

## 2022-03-02 NOTE — Anesthesia Preprocedure Evaluation (Signed)
Anesthesia Evaluation  ?Patient identified by MRN, date of birth, ID band ?Patient awake ? ? ? ?Reviewed: ?Allergy & Precautions, NPO status , Patient's Chart, lab work & pertinent test results ? ?History of Anesthesia Complications ?Negative for: history of anesthetic complications ? ?Airway ?Mallampati: III ? ?TM Distance: >3 FB ?Neck ROM: full ? ? ? Dental ? ?(+) Chipped, Poor Dentition, Missing ?  ?Pulmonary ?neg shortness of breath, sleep apnea , former smoker,  ?  ?Pulmonary exam normal ? ? ? ? ? ? ? Cardiovascular ?Exercise Tolerance: Good ?hypertension, (-) angina+ Peripheral Vascular Disease  ?(-) Past MI and (-) DOE Normal cardiovascular exam+ dysrhythmias Atrial Fibrillation  ? ? ?  ?Neuro/Psych ?negative neurological ROS ? negative psych ROS  ? GI/Hepatic ?Neg liver ROS, hiatal hernia, GERD  Controlled,  ?Endo/Other  ?diabetes, Type 2 ? Renal/GU ?  ? ?  ?Musculoskeletal ? ?(+) Arthritis ,  ? Abdominal ?  ?Peds ? Hematology ?negative hematology ROS ?(+)   ?Anesthesia Other Findings ?Past Medical History: ?No date: Adenoma of colon ?No date: Arthritis ?No date: Diabetes mellitus without complication ([place]) ?No date: Diverticulosis ?No date: GERD (gastroesophageal reflux disease) ?No date: Hiatal hernia ?No date: Hiatal hernia ?No date: History of gout ?No date: History of kidney stones ?No date: Hyperlipemia ?No date: Hypertension ?No date: Peripheral vascular disease ([place]) ?No date: Pre-diabetes ? ?Past Surgical History: ?No date: COLONOSCOPY ?06/18/2016: COLONOSCOPY WITH PROPOFOL; N/A ?    Comment:  Procedure: COLONOSCOPY WITH PROPOFOL;  Surgeon: Billie Ruddy ?             Gustavo Lah, MD;  Location: [facility] ENDOSCOPY;  Service:  ?             Endoscopy;  Laterality: N/A; ?01/09/2021: COLONOSCOPY WITH PROPOFOL; N/A ?    Comment:  Procedure: COLONOSCOPY WITH PROPOFOL;  Surgeon:  ?             Lesly Rubenstein, MD;  Location: [facility] ENDOSCOPY;   ?             Service: Endoscopy;   Laterality: N/A; ?12/19/2018: ENDARTERECTOMY; Right ?    Comment:  Procedure: ENDARTERECTOMY CAROTID;  Surgeon: Delana Meyer,  ?             Dolores Lory, MD;  Location: [facility] ORS;  Service: Vascular;   ?             Laterality: Right; ?06/18/2016: ESOPHAGOGASTRODUODENOSCOPY (EGD) WITH PROPOFOL; N/A ?    Comment:  Procedure: ESOPHAGOGASTRODUODENOSCOPY (EGD) WITH  ?             PROPOFOL;  Surgeon: Lollie Sails, MD;  Location:  ?             [place] ENDOSCOPY;  Service: Endoscopy;  Laterality: N/A; ?01/09/2021: ESOPHAGOGASTRODUODENOSCOPY (EGD) WITH PROPOFOL; N/A ?    Comment:  Procedure: ESOPHAGOGASTRODUODENOSCOPY (EGD) WITH  ?             PROPOFOL;  Surgeon: Lesly Rubenstein, MD;  Location:  ?             [place] ENDOSCOPY;  Service: Endoscopy;  Laterality: N/A; ?No date: HERNIA REPAIR ?    Comment:  umbilical ?No date: TONSILLECTOMY ? ?BMI   ? Body Mass Index: 31.41 kg/m?  ?  ? ? Reproductive/Obstetrics ?negative OB ROS ? ?  ? ? ? ? ? ? ? ? ? ? ? ? ? ?  ?  ? ? ? ? ? ? ? ? ?Anesthesia Physical ?Anesthesia  Plan ? ?ASA: 3 ? ?Anesthesia Plan: General ETT  ? ?Post-op Pain Management:   ? ?Induction: Intravenous ? ?PONV Risk Score and Plan: Ondansetron, Dexamethasone, Midazolam and Treatment may vary due to age or medical condition ? ?Airway Management Planned: Oral ETT ? ?Additional Equipment:  ? ?Intra-op Plan:  ? ?Post-operative Plan: Extubation in OR ? ?Informed Consent: I have reviewed the patients History and Physical, chart, labs and discussed the procedure including the risks, benefits and alternatives for the proposed anesthesia with the patient or authorized representative who has indicated his/her understanding and acceptance.  ? ? ? ?Dental Advisory Given ? ?Plan Discussed with: Anesthesiologist, CRNA and Surgeon ? ?Anesthesia Plan Comments: (Patient consented for risks of anesthesia including but not limited to:  ?- adverse reactions to medications ?- damage to eyes, teeth, lips or other oral mucosa ?- nerve damage due  to positioning  ?- sore throat or hoarseness ?- Damage to heart, brain, nerves, lungs, other parts of body or loss of life ? ?Patient voiced understanding.)  ? ? ? ? ? ? ?Anesthesia Quick Evaluation ? ?

## 2022-03-02 NOTE — Care Management Obs Status (Signed)
MEDICARE OBSERVATION STATUS NOTIFICATION ? ? ?Patient Details  ?Name: Victor Rojas ?MRN: [id number] ?Date of Birth: [birth year] ? ? ?Medicare Observation Status Notification Given:  Yes ? ? ? ?Beverly Sessions, RN ?03/02/2022, 3:50 PM ?

## 2022-03-02 NOTE — H&P (Signed)
[facility] ?SURGICAL HISTORY & PHYSICAL (cpt [phone]) ? ?HISTORY OF PRESENT ILLNESS (HPI):  ?76 y.o. male presented to [facility] ED yesterday for abdominal pain. Patient reports the acute onset of initially RLQ abdominal pain early in the day yesterday. This radiated to his epigastrium. He described this as sharp in nature and he did not get any relief from this. He does have associated nausea. No fever, chills, cough, SOB, CP, emesis, bowel changes, or juandice. He does think he has had a history of similar pains in the past but these were always less severe. Previous abdominal surgeries are positive for umbilical hernia repair. Work up in the ED revealed a normal WBC at 9.5K, sCr normal at 1.06, no electrolyte derangements, bilirubin normal at 0.8, and he underwent CT Abdomen/Pelvis which was concerning for acute cholecystitis.  ? ?General surgery is consults by emergency medicine physician Dr Blake Divine, MD for evaluation an management of acute cholecystitis.  ? ?PAST MEDICAL HISTORY (PMH):  ?Past Medical History:  ?Diagnosis Date  ? Adenoma of colon   ? Arthritis   ? Diabetes mellitus without complication ([place])   ? Diverticulosis   ? GERD (gastroesophageal reflux disease)   ? Hiatal hernia   ? Hiatal hernia   ? History of gout   ? History of kidney stones   ? Hyperlipemia   ? Hypertension   ? Peripheral vascular disease ([place])   ? Pre-diabetes   ?  ?Reviewed. Otherwise negative.  ? ?PAST SURGICAL HISTORY ([place]):  ?Past Surgical History:  ?Procedure Laterality Date  ? COLONOSCOPY    ? COLONOSCOPY WITH PROPOFOL N/A 06/18/2016  ? Procedure: COLONOSCOPY WITH PROPOFOL;  Surgeon: Lollie Sails, MD;  Location: [facility] ENDOSCOPY;  Service: Endoscopy;  Laterality: N/A;  ? COLONOSCOPY WITH PROPOFOL N/A 01/09/2021  ? Procedure: COLONOSCOPY WITH PROPOFOL;  Surgeon: Lesly Rubenstein, MD;  Location: [place] ENDOSCOPY;  Service: Endoscopy;  Laterality: N/A;  ? ENDARTERECTOMY Right 12/19/2018  ? Procedure:  ENDARTERECTOMY CAROTID;  Surgeon: Katha Cabal, MD;  Location: [facility] ORS;  Service: Vascular;  Laterality: Right;  ? ESOPHAGOGASTRODUODENOSCOPY (EGD) WITH PROPOFOL N/A 06/18/2016  ? Procedure: ESOPHAGOGASTRODUODENOSCOPY (EGD) WITH PROPOFOL;  Surgeon: Lollie Sails, MD;  Location: [facility] ENDOSCOPY;  Service: Endoscopy;  Laterality: N/A;  ? ESOPHAGOGASTRODUODENOSCOPY (EGD) WITH PROPOFOL N/A 01/09/2021  ? Procedure: ESOPHAGOGASTRODUODENOSCOPY (EGD) WITH PROPOFOL;  Surgeon: Lesly Rubenstein, MD;  Location: [facility] ENDOSCOPY;  Service: Endoscopy;  Laterality: N/A;  ? HERNIA REPAIR    ? umbilical  ? TONSILLECTOMY    ?  ?Reviewed. Otherwise negative.  ? ?MEDICATIONS:  ?Prior to Admission medications   ?Medication Sig Start Date End Date Taking? Authorizing Provider  ?allopurinol (ZYLOPRIM) 300 MG tablet Take 300 mg by mouth daily.    [provider]  ?amLODipine (NORVASC) 2.5 MG tablet Take 2.5 mg by mouth daily.    [provider]  ?aspirin EC 81 MG tablet Take 81 mg by mouth daily.     [provider]  ?atorvastatin (LIPITOR) 20 MG tablet Take 20 mg by mouth daily. 05/06/18   [provider]  ?Cholecalciferol (D3-1000) 25 MCG (1000 UT) tablet Take 1,000 Units by mouth daily.    [provider]  ?diphenhydrAMINE (BENADRYL) 25 mg capsule Take 25 mg by mouth daily.    [provider]  ?indomethacin (INDOCIN) 25 MG capsule Take 25 mg by mouth 2 (two) times daily with a meal.    [provider]  ?lisinopril-hydrochlorothiazide (PRINZIDE,ZESTORETIC) 20-12.5 MG tablet Take 2  tablets by mouth daily.     [provider]  ?metFORMIN (GLUCOPHAGE) 500 MG tablet Take 500 mg by mouth 2 (two) times daily. 09/02/21   [provider]  ?metoprolol tartrate (LOPRESSOR) 25 MG tablet Take 25 mg by mouth 2 (two) times daily.    [provider]  ?Multiple Vitamin (MULTI-VITAMINS) TABS Take 1 tablet by mouth daily.     [provider]   ?pantoprazole (PROTONIX) 40 MG tablet Take 40 mg by mouth daily. 09/12/21   [provider]  ?vitamin B-12 (CYANOCOBALAMIN) 1000 MCG tablet Take 1,000 mcg by mouth daily.    [provider]  ?vitamin C (ASCORBIC ACID) 500 MG tablet Take 500 mg by mouth daily.    [provider]  ?  ? ?ALLERGIES:  ?No Known Allergies  ? ?SOCIAL HISTORY:  ?Social History  ? ?Socioeconomic History  ? Marital status: Married  ?  Spouse name: Not on file  ? Number of children: Not on file  ? Years of education: Not on file  ? Highest education level: Not on file  ?Occupational History  ? Not on file  ?Tobacco Use  ? Smoking status: Former  ?  Types: Cigarettes  ?  Quit date: 02/03/1989  ?  Years since quitting: 33.0  ? Smokeless tobacco: Never  ?Vaping Use  ? Vaping Use: Never used  ?Substance and Sexual Activity  ? Alcohol use: Yes  ?  Alcohol/week: 6.0 standard drinks  ?  Types: 6 Cans of beer per week  ? Drug use: No  ? Sexual activity: Not on file  ?Other Topics Concern  ? Not on file  ?Social History Narrative  ? Not on file  ? ?Social Determinants of Health  ? ?Financial Resource Strain: Not on file  ?Food Insecurity: Not on file  ?Transportation Needs: Not on file  ?Physical Activity: Not on file  ?Stress: Not on file  ?Social Connections: Not on file  ?Intimate Partner Violence: Not on file  ?  ? ?FAMILY HISTORY:  ?Family History  ?Problem Relation Age of Onset  ? Cataracts Mother   ? Heart attack Mother   ? Cancer Sister   ?  ?Otherwise negative.  ? ?REVIEW OF SYSTEMS:  ?Review of Systems  ?Constitutional:  Negative for chills and fever.  ?Respiratory:  Negative for cough and shortness of breath.   ?Cardiovascular:  Negative for chest pain and palpitations.  ?Gastrointestinal:  Positive for abdominal pain and nausea. Negative for constipation, diarrhea and vomiting.  ?Genitourinary:  Negative for dysuria and urgency.  ?All other systems reviewed and are negative. ? ?VITAL SIGNS:  ?Temp:  [97.5 ?F (36.4  ?C)-98.2 ?F (36.8 ?C)] 98.1 ?F (36.7 ?C) (03/24 0405) ?Pulse Rate:  [71-80] 74 (03/24 0405) ?Resp:  [16-20] 18 (03/24 0405) ?BP: (135-184)/(53-80) 135/57 (03/24 0405) ?SpO2:  [95 %-97 %] 95 % (03/24 0028) ?Weight:  [93.7 kg-94.3 kg] 93.7 kg (03/24 0028)     Height: '5\' 8"'$  (172.7 cm) Weight: 93.7 kg BMI (Calculated): 31.42  ? ?PHYSICAL EXAM:  ?Physical Exam ?Vitals and nursing note reviewed.  ?Constitutional:   ?   General: He is not in acute distress. ?   Appearance: He is well-developed and normal weight. He is not ill-appearing.  ?HENT:  ?   Head: Normocephalic and atraumatic.  ?Eyes:  ?   General: No scleral icterus. ?   Extraocular Movements: Extraocular movements intact.  ?Cardiovascular:  ?   Rate and Rhythm: Normal rate and regular rhythm.  ?  Heart sounds: No murmur heard. ?Pulmonary:  ?   Effort: Pulmonary effort is normal. No respiratory distress.  ?Abdominal:  ?   General: There is no distension.  ?   Palpations: Abdomen is soft.  ?   Tenderness: There is abdominal tenderness in the right upper quadrant. There is no guarding or rebound. Negative signs include Murphy's sign.  ?   Hernia: A hernia is present. Hernia is present in the umbilical area.  ?Genitourinary: ?   Comments: Deferred ?Neurological:  ?   General: No focal deficit present.  ?   Mental Status: He is alert and oriented to person, place, and time.  ?Psychiatric:     ?   Mood and Affect: Mood normal.     ?   Behavior: Behavior normal.  ? ? ?INTAKE/OUTPUT:  ?This shift: No intake/output data recorded.  ?Last 2 shifts: '@IOLAST2SHIFTS'$ @ ? ?Labs:  ? ?  Latest Ref Rng & Units 03/02/2022  ?  3:52 AM 03/01/2022  ?  9:33 PM 12/20/2018  ?  4:29 AM  ?CBC  ?WBC 4.0 - 10.5 K/uL 12.4   9.5   10.5    ?Hemoglobin 13.0 - 17.0 g/dL 13.7   14.6   14.0    ?Hematocrit 39.0 - 52.0 % 40.9   43.9   42.4    ?Platelets 150 - 400 K/uL 103   112   116    ? ? ?  Latest Ref Rng & Units 03/01/2022  ?  9:33 PM 12/20/2018  ?  4:29 AM 12/15/2018  ?  2:55 PM  ?CMP  ?Glucose 70 - 99  mg/dL 181   164   117    ?BUN 8 - 23 mg/dL 24   25   34    ?Creatinine 0.61 - 1.24 mg/dL 1.06   1.00   1.28    ?Sodium 135 - 145 mmol/L 140   140   139    ?Potassium 3.5 - 5.1 mmol/L 4.3   4.5   4.1    ?Chloride 98 - 11

## 2022-03-02 NOTE — TOC Initial Note (Signed)
Transition of Care (TOC) - Initial/Assessment Note  ? ? ?Patient Details  ?Name: Victor Rojas ?MRN: [id number] ?Date of Birth: [birth year] ? ?Transition of Care (TOC) CM/SW Contact:    ?Beverly Sessions, RN ?Phone Number: ?03/02/2022, 2:22 PM ? ?Clinical Narrative:                 ? ? ?Transition of Care (TOC) Screening Note ? ? ?Patient Details  ?Name: Victor Rojas ?Date of Birth: 04/05/1946 ? ? ?Transition of Care (TOC) CM/SW Contact:    ?Beverly Sessions, RN ?Phone Number: ?03/02/2022, 2:22 PM ? ? ? ?Transition of Care Department [facility]) has reviewed patient and no TOC needs have been identified at this time. We will continue to monitor patient advancement through interdisciplinary progression rounds. If new patient transition needs arise, please place a TOC consult. ? ? ?  ?  ? ? ?Patient Goals and CMS Choice ?  ?  ?  ? ?Expected Discharge Plan and Services ?  ?  ?  ?  ?  ?                ?  ?  ?  ?  ?  ?  ?  ?  ?  ?  ? ?Prior Living Arrangements/Services ?  ?  ?  ?       ?  ?  ?  ?  ? ?Activities of Daily Living ?Home Assistive Devices/Equipment: None ?ADL Screening (condition at time of admission) ?Patient's cognitive ability adequate to safely complete daily activities?: Yes ?Is the patient deaf or have difficulty hearing?: No ?Does the patient have difficulty seeing, even when wearing glasses/contacts?: No ?Does the patient have difficulty concentrating, remembering, or making decisions?: No ?Patient able to express need for assistance with ADLs?: Yes ?Does the patient have difficulty dressing or bathing?: No ?Independently performs ADLs?: Yes (appropriate for developmental age) ?Does the patient have difficulty walking or climbing stairs?: No ?Weakness of Legs: None ?Weakness of Arms/Hands: None ? ?Permission Sought/Granted ?  ?  ?   ?   ?   ?   ? ?Emotional Assessment ?  ?  ?  ?  ?  ?  ? ?Admission diagnosis:  Acute cholecystitis [K81.0] ?Cholecystitis [K81.9] ?Patient Active Problem List  ? Diagnosis  Date Noted  ? Acute cholecystitis 03/01/2022  ? PAD (peripheral artery disease) ([place]) 11/11/2019  ? Carotid stenosis, asymptomatic, right 12/19/2018  ? Psoriasis 08/08/2018  ? Umbilical hernia without obstruction and without gangrene 08/08/2018  ? Bilateral carotid artery stenosis 05/06/2018  ? History of TIA (transient ischemic attack) 04/15/2018  ? Hyperlipidemia, mixed 01/08/2018  ? Abnormal LFTs (liver function tests) 01/17/2015  ? 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 Type II diabetes mellitus, uncontrolled 01/17/2015  ? Paroxysmal SVT (supraventricular tachycardia) ([place]) 11/25/2014  ? Hypertension 07/16/2014  ? History of gout 07/16/2014  ? ?PCP:  Baxter Hire, MD ?Pharmacy:   ?[facility] [address] ?[place] [address] ?Phone: [phone] Fax: [phone] ? ? ? ? ?Social Determinants of Health (SDOH) Interventions ?  ? ?Readmission Risk Interventions ?   ? View : No data to display.  ?  ?  ?  ? ? ? ?

## 2022-03-03 DIAGNOSIS — K819 Cholecystitis, unspecified: Principal | ICD-10-CM | POA: Diagnosis present

## 2022-03-03 DIAGNOSIS — I2510 Atherosclerotic heart disease of native coronary artery without angina pectoris: Secondary | ICD-10-CM | POA: Diagnosis present

## 2022-03-03 DIAGNOSIS — K821 Hydrops of gallbladder: Secondary | ICD-10-CM | POA: Diagnosis present

## 2022-03-03 DIAGNOSIS — Z8673 Personal history of transient ischemic attack (TIA), and cerebral infarction without residual deficits: Secondary | ICD-10-CM | POA: Diagnosis not present

## 2022-03-03 DIAGNOSIS — Z79899 Other long term (current) drug therapy: Secondary | ICD-10-CM | POA: Diagnosis not present

## 2022-03-03 DIAGNOSIS — Z7982 Long term (current) use of aspirin: Secondary | ICD-10-CM | POA: Diagnosis not present

## 2022-03-03 DIAGNOSIS — I10 Essential (primary) hypertension: Secondary | ICD-10-CM | POA: Diagnosis present

## 2022-03-03 DIAGNOSIS — K432 Incisional hernia without obstruction or gangrene: Secondary | ICD-10-CM | POA: Diagnosis present

## 2022-03-03 DIAGNOSIS — K810 Acute cholecystitis: Secondary | ICD-10-CM | POA: Diagnosis present

## 2022-03-03 DIAGNOSIS — K219 Gastro-esophageal reflux disease without esophagitis: Secondary | ICD-10-CM | POA: Diagnosis present

## 2022-03-03 DIAGNOSIS — Z7984 Long term (current) use of oral hypoglycemic drugs: Secondary | ICD-10-CM | POA: Diagnosis not present

## 2022-03-03 DIAGNOSIS — E782 Mixed hyperlipidemia: Secondary | ICD-10-CM | POA: Diagnosis present

## 2022-03-03 DIAGNOSIS — M109 Gout, unspecified: Secondary | ICD-10-CM | POA: Diagnosis present

## 2022-03-03 DIAGNOSIS — D696 Thrombocytopenia, unspecified: Secondary | ICD-10-CM | POA: Diagnosis present

## 2022-03-03 DIAGNOSIS — Z87891 Personal history of nicotine dependence: Secondary | ICD-10-CM | POA: Diagnosis not present

## 2022-03-03 DIAGNOSIS — E1151 Type 2 diabetes mellitus with diabetic peripheral angiopathy without gangrene: Secondary | ICD-10-CM | POA: Diagnosis present

## 2022-03-03 DIAGNOSIS — Z8249 Family history of ischemic heart disease and other diseases of the circulatory system: Secondary | ICD-10-CM | POA: Diagnosis not present

## 2022-03-03 LAB — COMPREHENSIVE METABOLIC PANEL
ALT: 129 U/L — ABNORMAL HIGH (ref 0–44)
AST: 108 U/L — ABNORMAL HIGH (ref 15–41)
Albumin: 3.1 g/dL — ABNORMAL LOW (ref 3.5–5.0)
Alkaline Phosphatase: 29 U/L — ABNORMAL LOW (ref 38–126)
Anion gap: 9 (ref 5–15)
BUN: 33 mg/dL — ABNORMAL HIGH (ref 8–23)
CO2: 24 mmol/L (ref 22–32)
Calcium: 7.7 mg/dL — ABNORMAL LOW (ref 8.9–10.3)
Chloride: 104 mmol/L (ref 98–111)
Creatinine, Ser: 1.5 mg/dL — ABNORMAL HIGH (ref 0.61–1.24)
GFR, Estimated: 48 mL/min — ABNORMAL LOW (ref >60)
Glucose, Bld: 166 mg/dL — ABNORMAL HIGH (ref 70–99)
Potassium: 4.2 mmol/L (ref 3.5–5.1)
Sodium: 137 mmol/L (ref 135–145)
Total Bilirubin: 1 mg/dL (ref 0.3–1.2)
Total Protein: 5.4 g/dL — ABNORMAL LOW (ref 6.5–8.1)

## 2022-03-03 LAB — CBC
HCT: 34.5 % — ABNORMAL LOW (ref 39.0–52.0)
Hemoglobin: 11.7 g/dL — ABNORMAL LOW (ref 13.0–17.0)
MCH: 35.2 pg — ABNORMAL HIGH (ref 26.0–34.0)
MCHC: 33.9 g/dL (ref 30.0–36.0)
MCV: 103.9 fL — ABNORMAL HIGH (ref 80.0–100.0)
Platelets: 84 10*3/uL — ABNORMAL LOW (ref 150–400)
RBC: 3.32 MIL/uL — ABNORMAL LOW (ref 4.22–5.81)
RDW: 14.2 % (ref 11.5–15.5)
WBC: 11 10*3/uL — ABNORMAL HIGH (ref 4.0–10.5)
nRBC: 0 % (ref 0.0–0.2)

## 2022-03-03 LAB — GLUCOSE, CAPILLARY
Glucose-Capillary: 159 mg/dL — ABNORMAL HIGH (ref 70–99)
Glucose-Capillary: 187 mg/dL — ABNORMAL HIGH (ref 70–99)
Glucose-Capillary: 162 mg/dL — ABNORMAL HIGH (ref 70–99)
Glucose-Capillary: 142 mg/dL — ABNORMAL HIGH (ref 70–99)

## 2022-03-03 NOTE — Progress Notes (Signed)
Mobility Specialist - Progress Note ? ? ? 03/03/22 1600  ?Mobility  ?Activity Ambulated with assistance in hallway;Stood at bedside;Dangled on edge of bed  ?Level of Assistance Standby assist, set-up cues, supervision of patient - no hands on  ?Assistive Device Front wheel walker  ?Distance Ambulated (ft) 100 ft  ?Activity Response Tolerated well  ?$Mobility charge 1 Mobility  ? ? ? ?During mobility: 50  BP, 89% SpO2 ?Post-mobility: 49 HR, BP, 92%SPO2 ? ?Pt supine upon arrival using RA. Pt completes bed mobility with ModI and extra time needed to sit EOB. Completes STS with supervision and ambulates with supervision in hallway --- terminated ambulation d/t HR above. Denies dizziness or chest pain. Returns to bed with MinA for bed mobility. Pt left with needs in reach and NT at bedside. ? ?Merrily Brittle ?Mobility Specialist ?03/03/22, 5:04 PM ? ? ?

## 2022-03-03 NOTE — Progress Notes (Signed)
[facility] ?SURGICAL PROGRESS NOTE ? ?Hospital Day(s): 0.  ? ?Post op day(s): 1 Day Post-Op.  ? ?Interval History: Patient seen and examined, no acute events or new complaints overnight. Patient reports marked improvement from preop.  Denies flatus or bowel activity.  Denies nausea or vomiting, minimally tolerance of clear liquid diets thus far. ? ?Review of Systems:  ?Constitutional: denies fever, chills  ?Respiratory: denies any shortness of breath  ?Cardiovascular: denies chest pain or palpitations  ?Musculoskeletal: denies pain, decreased motor or sensation ?Integumentary: denies any other rashes or skin discolorations ? ?Vital signs in last 24 hours: [min-max] current  ?Temp:  [97.4 ?F (36.3 ?C)-98.9 ?F (37.2 ?C)] 97.6 ?F (36.4 ?C) (03/25 0734) ?Pulse Rate:  [46-110] 92 (03/25 6599) ?Resp:  [15-20] 18 (03/25 0734) ?BP: (101-133)/(55-79) 133/78 (03/25 0734) ?SpO2:  [90 %-99 %] 99 % (03/25 0734)     Height: '5\' 8"'$  (172.7 cm) Weight: 93.7 kg BMI (Calculated): 31.42  ? ?Intake/Output last 2 shifts:  ?03/24 0701 - 03/25 0700 ?In: 1490.1 [I.V.:1236.8; IV Piggyback:253.3] ?Out: 970 [Urine:700; Drains:170; Blood:100]  ? ?Physical Exam:  ?Constitutional: alert, cooperative and no distress  ?Respiratory: breathing non-labored at rest  ?Cardiovascular: regular rate and sinus rhythm  ?Gastrointestinal: Laparoscopic incisions intact, soft, non-tender, and non-distended.  Right upper quadrant drain serosanguineous.  Left infraumbilical paramedian hernia nontender. ?Integumentary: No evidence of erythema. ? ?Labs:  ? ?  Latest Ref Rng & Units 03/03/2022  ?  5:25 AM 03/02/2022  ?  3:52 AM 03/01/2022  ?  9:33 PM  ?CBC  ?WBC 4.0 - 10.5 K/uL 11.0   12.4   9.5    ?Hemoglobin 13.0 - 17.0 g/dL 11.7   13.7   14.6    ?Hematocrit 39.0 - 52.0 % 34.5   40.9   43.9    ?Platelets 150 - 400 K/uL 84   103   112    ? ? ?  Latest Ref Rng & Units 03/03/2022  ?  5:25 AM 03/01/2022  ?  9:33 PM 12/20/2018  ?  4:29 AM  ?CMP  ?Glucose 70  - 99 mg/dL 166   181   164    ?BUN 8 - 23 mg/dL 33   24   25    ?Creatinine 0.61 - 1.24 mg/dL 1.50   1.06   1.00    ?Sodium 135 - 145 mmol/L 137   140   140    ?Potassium 3.5 - 5.1 mmol/L 4.2   4.3   4.5    ?Chloride 98 - 111 mmol/L 104   103   106    ?CO2 22 - 32 mmol/L '24   29   24    '$ ?Calcium 8.9 - 10.3 mg/dL 7.7   9.7   8.5    ?Total Protein 6.5 - 8.1 g/dL 5.4   7.1     ?Total Bilirubin 0.3 - 1.2 mg/dL 1.0   0.8     ?Alkaline Phos 38 - 126 U/L 29   52     ?AST 15 - 41 U/L 108   34     ?ALT 0 - 44 U/L 129   47     ? ? ? ?Imaging studies: No new pertinent imaging studies ? ? ?Assessment/Plan:  ?76 y.o. male with  1 Day Post-Op s/p robotic cholecystectomy for acute cholecystitis, complicated by pertinent comorbidities including: ? ?Patient Active Problem List  ? Diagnosis Date Noted  ? Acute cholecystitis 03/01/2022  ? PAD (peripheral artery  disease) ([place]) 11/11/2019  ? Carotid stenosis, asymptomatic, right 12/19/2018  ? Psoriasis 08/08/2018  ? Umbilical hernia without obstruction and without gangrene 08/08/2018  ? Bilateral carotid artery stenosis 05/06/2018  ? History of TIA (transient ischemic attack) 04/15/2018  ? Hyperlipidemia, mixed 01/08/2018  ? Abnormal LFTs (liver function tests) 01/17/2015  ? Primary thrombocytopenia ([place]) 01/17/2015  ? Type II diabetes mellitus, uncontrolled 01/17/2015  ? Paroxysmal SVT (supraventricular tachycardia) ([place]) 11/25/2014  ? Hypertension 07/16/2014  ? History of gout 07/16/2014  ? ? -Continue pain control, glycemic control, DVT and PPI prophylaxis. ? -Continue antibiotics and right upper quadrant drain. ? -May advance from clear liquid diets as patient tolerates. ? -Ambulation. ? ?All of the above findings and recommendations were discussed with the patient, and all of patient's questions were answered to their expressed satisfaction. ? ?-- ?Ronny Bacon, M.D., FACS ?03/03/2022  ?

## 2022-03-04 LAB — GLUCOSE, CAPILLARY
Glucose-Capillary: 127 mg/dL — ABNORMAL HIGH (ref 70–99)
Glucose-Capillary: 135 mg/dL — ABNORMAL HIGH (ref 70–99)

## 2022-03-04 LAB — BASIC METABOLIC PANEL
Anion gap: 7 (ref 5–15)
BUN: 31 mg/dL — ABNORMAL HIGH (ref 8–23)
CO2: 28 mmol/L (ref 22–32)
Calcium: 8.1 mg/dL — ABNORMAL LOW (ref 8.9–10.3)
Chloride: 107 mmol/L (ref 98–111)
Creatinine, Ser: 1.38 mg/dL — ABNORMAL HIGH (ref 0.61–1.24)
GFR, Estimated: 53 mL/min — ABNORMAL LOW (ref >60)
Glucose, Bld: 135 mg/dL — ABNORMAL HIGH (ref 70–99)
Potassium: 4.7 mmol/L (ref 3.5–5.1)
Sodium: 142 mmol/L (ref 135–145)

## 2022-03-04 MED ORDER — AMOXICILLIN-POT CLAVULANATE 875-125 MG PO TABS: 1.0000 | ORAL_TABLET | Freq: Two times a day (BID) | ORAL | 0 refills | Status: AC

## 2022-03-04 MED ORDER — HYDROCODONE-ACETAMINOPHEN 5-325 MG PO TABS: 1.0000 | ORAL_TABLET | Freq: Four times a day (QID) | ORAL | 0 refills | Status: DC | PRN

## 2022-03-04 MED ORDER — IBUPROFEN 800 MG PO TABS: 800.0000 mg | ORAL_TABLET | Freq: Three times a day (TID) | ORAL | 0 refills | Status: DC | PRN

## 2022-03-04 NOTE — Discharge Summary (Signed)
?Physician Discharge Summary  ?Patient ID: ?Victor Rojas ?MRN: [id number] ?DOB/AGE: 76-20-1947 76 y.o. ? ?Admit date: 03/01/2022 ?Discharge date: 03/04/2022 ? ?Admission Diagnoses: ? ?Discharge Diagnoses:  ?Principal Problem: ?  Acute cholecystitis ?Active Problems: ?  Cholecystitis ? ? ?Discharged Condition: good ? ?Hospital Course: Patient was seen the morning of the 24th, and proceeded to the OR later that day.  Postoperative right upper quadrant drain only drained serosanguineous fluid.  IV antibiotics were given, maintaining an afebrile state.  Labs improved, patient advanced his diet, was ambulating with adequate pain control with p.o. medications. ? ?Consults: None ? ?Significant Diagnostic Studies: radiology:  See reports for this admission. ? ?Treatments: surgery: Robotic assisted laparoscopic cholecystectomy. ? ?Discharge Exam: ?Blood pressure (!) 142/82, pulse (!) 53, temperature 97.8 ?F (36.6 ?C), temperature source Oral, resp. rate 18, height '5\' 8"'$  (1.727 m), weight 93.7 kg, SpO2 94 %. ?Resp: clear to auscultation bilaterally ?Cardio: regular rate and rhythm ?GI: soft, non-tender; bowel sounds normal; no masses,  no organomegaly and right upper quadrant drain intact secured. ?Incision/Wound: Incision's are clean, dry and intact with Dermabond in place. ? ?Disposition: Discharge disposition: 01-Home or Self Care ? ? ? ? ? ? ?Discharge Instructions   ? ? Call MD for:  persistant nausea and vomiting   Complete by: As directed ?  ? Call MD for:  redness, tenderness, or signs of infection (pain, swelling, redness, odor or green/yellow discharge around incision site)   Complete by: As directed ?  ? Call MD for:  severe uncontrolled pain   Complete by: As directed ?  ? Diet - low sodium heart healthy   Complete by: As directed ?  ? Discharge wound care:   Complete by: As directed ?  ? Your incision was closed with Dermabond.  It is best to keep it clean and dry, it will tolerate a brief shower, but do not  soak it or apply any creams or lotions to the incisions.  The Dermabond should gradually flake off over time.  Keep it open to air so you can evaluate your incisions.  Dermabond assists the underlying sutures to keep your incision closed and protected from infection.  Should you develop some drainage from your incision, some drops of drainage would be okay but if it persists continue to put keep a dry dressing over it.  ? Driving Restrictions   Complete by: As directed ?  ? No driving until cleared after follow-up appointment.  Is not advised to drive while taking narcotic pain medications or in significant pain.  ? Increase activity slowly   Complete by: As directed ?  ? Lifting restrictions   Complete by: As directed ?  ? Strongly advised against any form of lifting greater than 15 pounds over the next 4 to 6 weeks.  This involves pushing/pulling movements as well.  After 4 weeks when may gradually engage in more activities remaining aware of any new pain/tenderness elicited, and avoiding those for the full duration of 6 weeks.  Walking is encouraged.  Climbing stairs with caution.  ? ?  ? ?Allergies as of 03/04/2022   ?No Known Allergies ?  ? ?  ?Medication List  ?  ? ?TAKE these medications   ? ?allopurinol 300 MG tablet ?Commonly known as: ZYLOPRIM ?Take 300 mg by mouth daily. ?  ?amLODipine 2.5 MG tablet ?Commonly known as: NORVASC ?Take 2.5 mg by mouth daily. ?  ?amoxicillin-clavulanate 875-125 MG tablet ?Commonly known as: Augmentin ?Take 1 tablet by mouth  every 12 (twelve) hours for 7 days. ?  ?aspirin EC 81 MG tablet ?Take 81 mg by mouth daily. ?  ?atorvastatin 40 MG tablet ?Commonly known as: LIPITOR ?Take 40 mg by mouth daily. ?  ?D3-1000 25 MCG (1000 UT) tablet ?Generic drug: Cholecalciferol ?Take 1,000 Units by mouth daily. ?  ?diphenhydrAMINE 25 mg capsule ?Commonly known as: BENADRYL ?Take 25 mg by mouth daily. ?  ?HYDROcodone-acetaminophen 5-325 MG tablet ?Commonly known as: NORCO/VICODIN ?Take 1  tablet by mouth every 6 (six) hours as needed for moderate pain. ?  ?ibuprofen 800 MG tablet ?Commonly known as: ADVIL ?Take 1 tablet (800 mg total) by mouth every 8 (eight) hours as needed. ?  ?lisinopril-hydrochlorothiazide 20-12.5 MG tablet ?Commonly known as: ZESTORETIC ?Take 2 tablets by mouth daily. ?  ?metFORMIN 500 MG tablet ?Commonly known as: GLUCOPHAGE ?Take 500 mg by mouth 2 (two) times daily. ?  ?metoprolol tartrate 25 MG tablet ?Commonly known as: LOPRESSOR ?Take 25 mg by mouth 2 (two) times daily. ?  ?Multi-Vitamins Tabs ?Take 1 tablet by mouth daily. ?  ?pantoprazole 40 MG tablet ?Commonly known as: PROTONIX ?Take 40 mg by mouth daily. ?  ?vitamin B-12 1000 MCG tablet ?Commonly known as: CYANOCOBALAMIN ?Take 1,000 mcg by mouth daily. ?  ?vitamin C 500 MG tablet ?Commonly known as: ASCORBIC ACID ?Take 500 mg by mouth daily. ?  ? ?  ? ?  ?  ? ? ?  ?Discharge Care Instructions  ?(From admission, onward)  ?  ? ? ?  ? ?  Start     Ordered  ? 03/04/22 0000  Discharge wound care:       ?Comments: Your incision was closed with Dermabond.  It is best to keep it clean and dry, it will tolerate a brief shower, but do not soak it or apply any creams or lotions to the incisions.  The Dermabond should gradually flake off over time.  Keep it open to air so you can evaluate your incisions.  Dermabond assists the underlying sutures to keep your incision closed and protected from infection.  Should you develop some drainage from your incision, some drops of drainage would be okay but if it persists continue to put keep a dry dressing over it.  ? 03/04/22 1117  ? ?  ?  ? ?  ? ? Follow-up Information   ? ? Tylene Fantasia, PA-C. Schedule an appointment as soon as possible for a visit on 03/08/2022.   ?Specialty: Physician Assistant ?Why: s/p lap chole with drain. ?Contact information: ?[address] ?[address] ?[place] [address] ?[phone] ? ? ?  ?  ? ?  ?  ? ?  ? ? ?Signed: ?Ronny Bacon, M.D., FACS ?[facility] ?03/04/2022, 11:19 AM ? ? ?

## 2022-03-04 NOTE — Plan of Care (Signed)
VSS, patient alert and oriented and pain is managed with tylenol.  Discharge instructions reviewed with family members present.  JP drain emptied and patient and family instructed on how to manage at home.  IV's removed and patient to be discharged to home with family ?

## 2022-03-05 LAB — SURGICAL PATHOLOGY

## 2022-03-05 NOTE — Anesthesia Postprocedure Evaluation (Signed)
Anesthesia Post Note ? ?Patient: Victor Rojas ? ?Procedure(s) Performed: XI ROBOTIC ASSISTED LAPAROSCOPIC CHOLECYSTECTOMY ?INDOCYANINE GREEN FLUORESCENCE IMAGING (ICG) ? ?Patient location during evaluation: PACU ?Anesthesia Type: General ?Level of consciousness: awake and alert ?Pain management: pain level controlled ?Vital Signs Assessment: post-procedure vital signs reviewed and stable ?Respiratory status: spontaneous breathing, nonlabored ventilation, respiratory function stable and patient connected to nasal cannula oxygen ?Cardiovascular status: blood pressure returned to baseline and stable ?Postop Assessment: no apparent nausea or vomiting ?Anesthetic complications: no ? ? ?No notable events documented. ? ? ?Last Vitals:  ?Vitals:  ? 03/04/22 0445 03/04/22 0750  ?BP: 110/83 (!) 142/82  ?Pulse: 70 (!) 53  ?Resp: 17 18  ?Temp: 36.5 ?C 36.6 ?C  ?SpO2: 93% 94%  ?  ?Last Pain:  ?Vitals:  ? 03/04/22 0800  ?TempSrc:   ?PainSc: 4   ? ? ?  ?  ?  ?  ?  ?  ? ?Victor Rojas ? ? ? ? ?

## 2022-03-08 ENCOUNTER — Ambulatory Visit (INDEPENDENT_AMBULATORY_CARE_PROVIDER_SITE_OTHER): Payer: Medicare Other | Admitting: Physician Assistant

## 2022-03-08 ENCOUNTER — Encounter: Payer: Self-pay | Admitting: Physician Assistant

## 2022-03-08 VITALS — BP 166/90 | HR 76 | Temp 98.0°F | Ht 68.0 in | Wt 202.8 lb

## 2022-03-08 DIAGNOSIS — K810 Acute cholecystitis: Secondary | ICD-10-CM

## 2022-03-08 DIAGNOSIS — K819 Cholecystitis, unspecified: Secondary | ICD-10-CM

## 2022-03-08 DIAGNOSIS — Z09 Encounter for follow-up examination after completed treatment for conditions other than malignant neoplasm: Secondary | ICD-10-CM

## 2022-03-08 NOTE — Progress Notes (Signed)
[facility] ?POST-OP OFFICE VISIT ? ?03/08/2022 ? ?HPI: ?Victor Rojas is a 76 y.o. male 6 days s/p robotic assisted laparoscopic cholecystectomy for acute cholecystitis with Dr Dahlia Byes  ? ?He is doing really well given the circumstances ?Still expectedly sore, especially at the drain site ?No fever, chills, nausea, emesis ?He is having diarrhea ?No issues with the incisions ?No other complaints  ?Drain with <10 ccs daily; serous  ? ?Vital signs: ?BP (!) 166/90   Pulse 76   Temp 98 ?F (36.7 ?C) (Oral)   Ht '5\' 8"'$  (1.727 m)   Wt 202 lb 12.8 oz (92 kg)   SpO2 97%   BMI 30.84 kg/m?   ? ?Physical Exam: ?Constitutional: Well appearing male, NAD ?Abdomen: Soft, incisional soreness, non-distended, no rebound/guarding. Drain in RLQ with serous output.  ?Skin: Laparoscopic incisions are healing well, still with dermabond, no erythema or drainage  ? ?Assessment/Plan: ?This is a 76 y.o. male 6 days s/p robotic assisted laparoscopic cholecystectomy for acute cholecystitis ? ? - Pain control prn ? - Drain removed; occlusive dressing placed ? - Reviewed wound care recommendation ? - Reviewed lifting restrictions; 4 weeks total ? - Reviewed surgical pathology; Acute on chronic cholecystitis  ? - I will see him again in ~2 weeks ? ?-- ?Edison Simon, PA-C ?[facility] ?03/08/2022, 2:58 PM ?[phone] ?M-F: 7am - 4pm ? ?

## 2022-03-08 NOTE — Patient Instructions (Signed)
If you have any concerns or questions, please feel free to call our office.  ? ? ?GENERAL POST-OPERATIVE ?PATIENT INSTRUCTIONS  ? ?WOUND CARE INSTRUCTIONS:  Keep a dry clean dressing on the wound if there is drainage. The initial bandage may be removed after 24 hours.  Once the wound has quit draining you may leave it open to air.  If clothing rubs against the wound or causes irritation and the wound is not draining you may cover it with a dry dressing during the daytime.  Try to keep the wound dry and avoid ointments on the wound unless directed to do so.  If the wound becomes bright red and painful or starts to drain infected material that is not clear, please contact your physician immediately.  If the wound is mildly pink and has a thick firm ridge underneath it, this is normal, and is referred to as a healing ridge.  This will resolve over the next 4-6 weeks. ? ?BATHING: ?You may shower if you have been informed of this by your surgeon. However, Please do not submerge in a tub, hot tub, or pool until incisions are completely sealed or have been told by your surgeon that you may do so. ? ?DIET:  You may eat any foods that you can tolerate.  It is a good idea to eat a high fiber diet and take in plenty of fluids to prevent constipation.  If you do become constipated you may want to take a mild laxative or take ducolax tablets on a daily basis until your bowel habits are regular.  Constipation can be very uncomfortable, along with straining, after recent surgery. ? ?ACTIVITY:  You are encouraged to cough and deep breath or use your incentive spirometer if you were given one, every 15-30 minutes when awake.  This will help prevent respiratory complications and low grade fevers post-operatively if you had a general anesthetic.  You may want to hug a pillow when coughing and sneezing to add additional support to the surgical area, if you had abdominal or chest surgery, which will decrease pain during these times.   You are encouraged to walk and engage in light activity for the next two weeks.  You should not lift, push or pull more than 20 pounds for 4 weeks total after surgery as it could put you at increased risk for complications.  Twenty pounds is roughly equivalent to a plastic bag of groceries. At that time- Listen to your body when lifting, if you have pain when lifting, stop and then try again in a few days. Soreness after doing exercises or activities of daily living is normal as you get back in to your normal routine. ? ?MEDICATIONS:  Try to take narcotic medications and anti-inflammatory medications, such as tylenol, ibuprofen, naprosyn, etc., with food.  This will minimize stomach upset from the medication.  Should you develop nausea and vomiting from the pain medication, or develop a rash, please discontinue the medication and contact your physician.  You should not drive, make important decisions, or operate machinery when taking narcotic pain medication. ? ?SUNBLOCK ?Use sun block to incision area over the next year if this area will be exposed to sun. This helps decrease scarring and will allow you avoid a permanent darkened area over your incision. ? ?QUESTIONS:  Please feel free to call our office if you have any questions, and we will be glad to assist you. [phone] ? ? ? ?Gallbladder Eating Plan ? ?If you have a  gallbladder condition, you may have trouble digesting fats. Eating a low-fat diet can help reduce your symptoms, and may be helpful before and after having surgery to remove your gallbladder (cholecystectomy). Your health care provider may recommend that you work with a diet and nutrition specialist (dietitian) to help you reduce the amount of fat in your diet. ?What are tips for following this plan? ?General guidelines ?Limit your fat intake to less than 30% of your total daily calories. If you eat around 1,800 calories each day, this is less than 60 grams (g) of fat per day. ?Fat is an  important part of a healthy diet. Eating a low-fat diet can make it hard to maintain a healthy body weight. Ask your dietitian how much fat, calories, and other nutrients you need each day. ?Eat small, frequent meals throughout the day instead of three large meals. ?Drink at least 8-10 cups of fluid a day. Drink enough fluid to keep your urine clear or pale yellow. ?Limit alcohol intake to no more than 1 drink a day for nonpregnant women and 2 drinks a day for men. One drink equals 12 oz of beer, 5 oz of wine, or 1? oz of hard liquor. ?Reading food labels ? ?Check Nutrition Facts on food labels for the amount of fat per serving. Choose foods with less than 3 grams of fat per serving. ?Shopping ?Choose nonfat and low-fat healthy foods. Look for the words "nonfat," "low fat," or "fat free." ?Avoid buying processed or prepackaged foods. ?Cooking ?Cook using low-fat methods, such as baking, broiling, grilling, or boiling. ?Cook with small amounts of healthy fats, such as olive oil, grapeseed oil, canola oil, or sunflower oil. ?What foods are recommended? ?All fresh, frozen, or canned fruits and vegetables. ?Whole grains. ?Low-fat or non-fat (skim) milk and yogurt. ?Lean meat, skinless poultry, fish, eggs, and beans. ?Low-fat protein supplement powders or drinks. ?Spices and herbs. ?What foods are not recommended? ?High-fat foods. These include baked goods, fast food, fatty cuts of meat, ice cream, french toast, sweet rolls, pizza, cheese bread, foods covered with butter, creamy sauces, or cheese. ?Fried foods. These include french fries, tempura, battered fish, breaded chicken, fried breads, and sweets. ?Foods with strong odors. ?Foods that cause bloating and gas. ?Summary ?A low-fat diet can be helpful if you have a gallbladder condition, or before and after gallbladder surgery. ?Limit your fat intake to less than 30% of your total daily calories. This is about 60 g of fat if you eat 1,800 calories each day. ?Eat  small, frequent meals throughout the day instead of three large meals. ?This information is not intended to replace advice given to you by your health care provider. Make sure you discuss any questions you have with your health care provider. ?Document Revised: 07/08/2020 Document Reviewed: 07/14/2020 ?Elsevier Patient Education ? [place]. ? ?

## 2022-03-15 ENCOUNTER — Encounter: Payer: Medicare Other | Admitting: Physician Assistant

## 2022-03-21 ENCOUNTER — Other Ambulatory Visit (INDEPENDENT_AMBULATORY_CARE_PROVIDER_SITE_OTHER): Payer: Self-pay | Admitting: Nurse Practitioner

## 2022-03-21 DIAGNOSIS — I6523 Occlusion and stenosis of bilateral carotid arteries: Secondary | ICD-10-CM

## 2022-03-22 ENCOUNTER — Encounter: Payer: Self-pay | Admitting: Physician Assistant

## 2022-03-22 ENCOUNTER — Ambulatory Visit (INDEPENDENT_AMBULATORY_CARE_PROVIDER_SITE_OTHER): Payer: Medicare Other | Admitting: Physician Assistant

## 2022-03-22 VITALS — BP 150/80 | HR 83 | Temp 97.9°F | Ht 68.0 in | Wt 201.2 lb

## 2022-03-22 DIAGNOSIS — K819 Cholecystitis, unspecified: Secondary | ICD-10-CM

## 2022-03-22 DIAGNOSIS — Z09 Encounter for follow-up examination after completed treatment for conditions other than malignant neoplasm: Secondary | ICD-10-CM

## 2022-03-22 DIAGNOSIS — K810 Acute cholecystitis: Secondary | ICD-10-CM

## 2022-03-22 NOTE — Progress Notes (Signed)
[facility] ?POST-OP OFFICE VISIT ? ?03/22/2022 ? ?HPI: ?Victor Rojas is a 76 y.o. male 20 days s/p robotic assisted laparoscopic cholecystectomy for acute cholecystitis with Dr Dahlia Byes ? ?He continues to do exceptional well ?No abdominal pain ?No fever, chills, nausea, emesis ?He is tolerating PO; no issues with Diarrhea ?Incisions are healing well ?No further complaints  ? ?Vital signs: ?BP (!) 150/80   Pulse 83   Temp 97.9 ?F (36.6 ?C) (Oral)   Ht '5\' 8"'$  (1.727 m)   Wt 201 lb 3.2 oz (91.3 kg)   SpO2 91%   BMI 30.59 kg/m?   ? ?Physical Exam: ?Constitutional: Well appearing male, NAD ?Abdomen: Soft, non-tender, non-distended, no rebound/guarding ?Skin: Laparoscopic incisions are healing well, no erythema or drainage  ? ?Assessment/Plan: ?This is a 76 y.o. male 20 days s/p robotic assisted laparoscopic cholecystectomy for acute cholecystitis  ? ? - Pain control prn ? - Reviewed wound care recommendation ? - Reviewed lifting restrictions; 4 weeks total ? - He can follow up on as needed basis; He understands to call with questions/concerns ? ?-- ?Edison Simon, PA-C ? [facility] ?03/22/2022, 2:25 PM ?M-F: 7am - 4pm ? ?

## 2022-03-22 NOTE — Patient Instructions (Signed)
If you have any concerns or questions, please feel free to call our office. Follow up as needed. ? ? ? ?GENERAL POST-OPERATIVE ?PATIENT INSTRUCTIONS  ? ?WOUND CARE INSTRUCTIONS:  Keep a dry clean dressing on the wound if there is drainage. The initial bandage may be removed after 24 hours.  Once the wound has quit draining you may leave it open to air.  If clothing rubs against the wound or causes irritation and the wound is not draining you may cover it with a dry dressing during the daytime.  Try to keep the wound dry and avoid ointments on the wound unless directed to do so.  If the wound becomes bright red and painful or starts to drain infected material that is not clear, please contact your physician immediately.  If the wound is mildly pink and has a thick firm ridge underneath it, this is normal, and is referred to as a healing ridge.  This will resolve over the next 4-6 weeks. ? ?BATHING: ?You may shower if you have been informed of this by your surgeon. However, Please do not submerge in a tub, hot tub, or pool until incisions are completely sealed or have been told by your surgeon that you may do so. ? ?DIET:  You may eat any foods that you can tolerate.  It is a good idea to eat a high fiber diet and take in plenty of fluids to prevent constipation.  If you do become constipated you may want to take a mild laxative or take ducolax tablets on a daily basis until your bowel habits are regular.  Constipation can be very uncomfortable, along with straining, after recent surgery. ? ?ACTIVITY:  You are encouraged to cough and deep breath or use your incentive spirometer if you were given one, every 15-30 minutes when awake.  This will help prevent respiratory complications and low grade fevers post-operatively if you had a general anesthetic.  You may want to hug a pillow when coughing and sneezing to add additional support to the surgical area, if you had abdominal or chest surgery, which will decrease pain  during these times.  You are encouraged to walk and engage in light activity for the next two weeks.  You should not lift, push or pull more than 20 pounds for 4 weeks total after surgery as it could put you at increased risk for complications.  Twenty pounds is roughly equivalent to a plastic bag of groceries. At that time- Listen to your body when lifting, if you have pain when lifting, stop and then try again in a few days. Soreness after doing exercises or activities of daily living is normal as you get back in to your normal routine. ? ?MEDICATIONS:  Try to take narcotic medications and anti-inflammatory medications, such as tylenol, ibuprofen, naprosyn, etc., with food.  This will minimize stomach upset from the medication.  Should you develop nausea and vomiting from the pain medication, or develop a rash, please discontinue the medication and contact your physician.  You should not drive, make important decisions, or operate machinery when taking narcotic pain medication. ? ?SUNBLOCK ?Use sun block to incision area over the next year if this area will be exposed to sun. This helps decrease scarring and will allow you avoid a permanent darkened area over your incision. ? ?QUESTIONS:  Please feel free to call our office if you have any questions, and we will be glad to assist you. [phone] ? ? ? ?Gallbladder Eating Plan ? ?  If you have a gallbladder condition, you may have trouble digesting fats. Eating a low-fat diet can help reduce your symptoms, and may be helpful before and after having surgery to remove your gallbladder (cholecystectomy). Your health care provider may recommend that you work with a diet and nutrition specialist (dietitian) to help you reduce the amount of fat in your diet. ?What are tips for following this plan? ?General guidelines ?Limit your fat intake to less than 30% of your total daily calories. If you eat around 1,800 calories each day, this is less than 60 grams (g) of fat per  day. ?Fat is an important part of a healthy diet. Eating a low-fat diet can make it hard to maintain a healthy body weight. Ask your dietitian how much fat, calories, and other nutrients you need each day. ?Eat small, frequent meals throughout the day instead of three large meals. ?Drink at least 8-10 cups of fluid a day. Drink enough fluid to keep your urine clear or pale yellow. ?Limit alcohol intake to no more than 1 drink a day for nonpregnant women and 2 drinks a day for men. One drink equals 12 oz of beer, 5 oz of wine, or 1? oz of hard liquor. ?Reading food labels ? ?Check Nutrition Facts on food labels for the amount of fat per serving. Choose foods with less than 3 grams of fat per serving. ?Shopping ?Choose nonfat and low-fat healthy foods. Look for the words "nonfat," "low fat," or "fat free." ?Avoid buying processed or prepackaged foods. ?Cooking ?Cook using low-fat methods, such as baking, broiling, grilling, or boiling. ?Cook with small amounts of healthy fats, such as olive oil, grapeseed oil, canola oil, or sunflower oil. ?What foods are recommended? ?All fresh, frozen, or canned fruits and vegetables. ?Whole grains. ?Low-fat or non-fat (skim) milk and yogurt. ?Lean meat, skinless poultry, fish, eggs, and beans. ?Low-fat protein supplement powders or drinks. ?Spices and herbs. ?What foods are not recommended? ?High-fat foods. These include baked goods, fast food, fatty cuts of meat, ice cream, french toast, sweet rolls, pizza, cheese bread, foods covered with butter, creamy sauces, or cheese. ?Fried foods. These include french fries, tempura, battered fish, breaded chicken, fried breads, and sweets. ?Foods with strong odors. ?Foods that cause bloating and gas. ?Summary ?A low-fat diet can be helpful if you have a gallbladder condition, or before and after gallbladder surgery. ?Limit your fat intake to less than 30% of your total daily calories. This is about 60 g of fat if you eat 1,800 calories each  day. ?Eat small, frequent meals throughout the day instead of three large meals. ?This information is not intended to replace advice given to you by your health care provider. Make sure you discuss any questions you have with your health care provider. ?Document Revised: 07/08/2020 Document Reviewed: 07/14/2020 ?Elsevier Patient Education ? [place]. ? ?

## 2022-03-26 ENCOUNTER — Encounter (INDEPENDENT_AMBULATORY_CARE_PROVIDER_SITE_OTHER): Payer: Self-pay | Admitting: Vascular Surgery

## 2022-03-26 ENCOUNTER — Ambulatory Visit (INDEPENDENT_AMBULATORY_CARE_PROVIDER_SITE_OTHER): Payer: Medicare Other | Admitting: Vascular Surgery

## 2022-03-26 ENCOUNTER — Ambulatory Visit (INDEPENDENT_AMBULATORY_CARE_PROVIDER_SITE_OTHER): Payer: Medicare Other

## 2022-03-26 VITALS — BP 165/76 | HR 63 | Resp 17 | Ht 68.0 in | Wt 203.0 lb

## 2022-03-26 DIAGNOSIS — I6523 Occlusion and stenosis of bilateral carotid arteries: Secondary | ICD-10-CM

## 2022-03-26 DIAGNOSIS — I10 Essential (primary) hypertension: Secondary | ICD-10-CM | POA: Diagnosis not present

## 2022-03-26 DIAGNOSIS — I70213 Atherosclerosis of native arteries of extremities with intermittent claudication, bilateral legs: Secondary | ICD-10-CM | POA: Diagnosis not present

## 2022-03-26 DIAGNOSIS — I70219 Atherosclerosis of native arteries of extremities with intermittent claudication, unspecified extremity: Secondary | ICD-10-CM | POA: Insufficient documentation

## 2022-03-26 DIAGNOSIS — E782 Mixed hyperlipidemia: Secondary | ICD-10-CM

## 2022-03-26 DIAGNOSIS — E1165 Type 2 diabetes mellitus with hyperglycemia: Secondary | ICD-10-CM | POA: Insufficient documentation

## 2022-03-26 DIAGNOSIS — E1359 Other specified diabetes mellitus with other circulatory complications: Secondary | ICD-10-CM | POA: Diagnosis not present

## 2022-03-26 DIAGNOSIS — E119 Type 2 diabetes mellitus without complications: Secondary | ICD-10-CM | POA: Insufficient documentation

## 2022-03-26 NOTE — Progress Notes (Signed)
? ? ? ? ?MRN : [id number] ? ?Victor Rojas is a 76 y.o. (1946/09/28) male who presents with chief complaint of check carotid arteries. ? ?History of Present Illness:  ? ?The patient is also seen for follow up evaluation of carotid stenosis. The carotid stenosis followed by ultrasound.  ?  ?The patient denies amaurosis fugax. There is no recent history of TIA symptoms or focal motor deficits. There is no prior documented CVA. ?  ?The patient is taking enteric-coated aspirin 81 mg daily. ?  ?There is no history of migraine headaches. There is no history of seizures. ?  ?There have been no significant changes to the patient's overall health care. ?  ?He is also followed for his PAD of the lower extremities.  He states his feet continue to feel numb at rest.  No changes in his ability to walk or his claudication distance.  No rest pain. No new ulcers or wounds ?  ?The patient has a history of coronary artery disease, no recent episodes of angina or shortness of breath. ? ?There is a history of hyperlipidemia which is being treated with a statin.   ?  ?Previous carotid Duplex done today shows widely patent right CEA with <56% LICA.  No significant change compared to previus study ?  ?Previous ABI's Rt=0.60 and Lt=0.76 (previous ABI's Rt=0.75 and Lt=0.81) ? ?No outpatient medications have been marked as taking for the 03/26/22 encounter (Appointment) with Delana Meyer, Dolores Lory, MD.  ? ? ?Past Medical History:  ?Diagnosis Date  ? Adenoma of colon   ? Arthritis   ? Diabetes mellitus without complication ([place])   ? Diverticulosis   ? GERD (gastroesophageal reflux disease)   ? Hiatal hernia   ? Hiatal hernia   ? History of gout   ? History of kidney stones   ? Hyperlipemia   ? Hypertension   ? Peripheral vascular disease ([place])   ? Pre-diabetes   ? ? ?Past Surgical History:  ?Procedure Laterality Date  ? COLONOSCOPY    ? COLONOSCOPY WITH PROPOFOL N/A 06/18/2016  ? Procedure: COLONOSCOPY WITH PROPOFOL;  Surgeon: Lollie Sails,  MD;  Location: [facility] ENDOSCOPY;  Service: Endoscopy;  Laterality: N/A;  ? COLONOSCOPY WITH PROPOFOL N/A 01/09/2021  ? Procedure: COLONOSCOPY WITH PROPOFOL;  Surgeon: Lesly Rubenstein, MD;  Location: [facility] ENDOSCOPY;  Service: Endoscopy;  Laterality: N/A;  ? ENDARTERECTOMY Right 12/19/2018  ? Procedure: ENDARTERECTOMY CAROTID;  Surgeon: Katha Cabal, MD;  Location: [facility] ORS;  Service: Vascular;  Laterality: Right;  ? ESOPHAGOGASTRODUODENOSCOPY (EGD) WITH PROPOFOL N/A 06/18/2016  ? Procedure: ESOPHAGOGASTRODUODENOSCOPY (EGD) WITH PROPOFOL;  Surgeon: Lollie Sails, MD;  Location: [facility] ENDOSCOPY;  Service: Endoscopy;  Laterality: N/A;  ? ESOPHAGOGASTRODUODENOSCOPY (EGD) WITH PROPOFOL N/A 01/09/2021  ? Procedure: ESOPHAGOGASTRODUODENOSCOPY (EGD) WITH PROPOFOL;  Surgeon: Lesly Rubenstein, MD;  Location: [facility] ENDOSCOPY;  Service: Endoscopy;  Laterality: N/A;  ? HERNIA REPAIR    ? umbilical  ? TONSILLECTOMY    ? ? ?Social History ?Social History  ? ?Tobacco Use  ? Smoking status: Former  ?  Types: Cigarettes  ?  Quit date: 02/03/1989  ?  Years since quitting: 33.1  ? Smokeless tobacco: Never  ?Vaping Use  ? Vaping Use: Never used  ?Substance Use Topics  ? Alcohol use: Yes  ?  Alcohol/week: 6.0 standard drinks  ?  Types: 6 Cans of beer per week  ? Drug use: No  ? ? ?Family History ?Family History  ?Problem Relation Age of Onset  ?  Cataracts Mother   ? Heart attack Mother   ? Cancer Sister   ? ? ?No Known Allergies ? ? ?REVIEW OF SYSTEMS (Negative unless checked) ? ?Constitutional: '[]'$ Weight loss  '[]'$ Fever  '[]'$ Chills ?Cardiac: '[]'$ Chest pain   '[]'$ Chest pressure   '[]'$ Palpitations   '[]'$ Shortness of breath when laying flat   '[]'$ Shortness of breath with exertion. ?Vascular:  '[x]'$ Pain in legs with walking   '[]'$ Pain in legs at rest  '[]'$ History of DVT   '[]'$ Phlebitis   '[]'$ Swelling in legs   '[]'$ Varicose veins   '[]'$ Non-healing ulcers ?Pulmonary:   '[]'$ Uses home oxygen   '[]'$ Productive cough   '[]'$ Hemoptysis   '[]'$ Wheeze  '[]'$ COPD    '[]'$ Asthma ?Neurologic:  '[]'$ Dizziness   '[]'$ Seizures   '[]'$ History of stroke   '[]'$ History of TIA  '[]'$ Aphasia   '[]'$ Vissual changes   '[]'$ Weakness or numbness in arm   '[]'$ Weakness or numbness in leg ?Musculoskeletal:   '[]'$ Joint swelling   '[]'$ Joint pain   '[]'$ Low back pain ?Hematologic:  '[]'$ Easy bruising  '[]'$ Easy bleeding   '[]'$ Hypercoagulable state   '[]'$ Anemic ?Gastrointestinal:  '[]'$ Diarrhea   '[]'$ Vomiting  '[]'$ Gastroesophageal reflux/heartburn   '[]'$ Difficulty swallowing. ?Genitourinary:  '[]'$ Chronic kidney disease   '[]'$ Difficult urination  '[]'$ Frequent urination   '[]'$ Blood in urine ?Skin:  '[]'$ Rashes   '[]'$ Ulcers  ?Psychological:  '[]'$ History of anxiety   '[]'$  History of major depression. ? ?Physical Examination ? ?There were no vitals filed for this visit. ?There is no height or weight on file to calculate BMI. ?Gen: WD/WN, NAD ?Head: [place]/AT, No temporalis wasting.  ?Ear/Nose/Throat: Hearing grossly intact, nares w/o erythema or drainage ?Eyes: PER, EOMI, sclera nonicteric.  ?Neck: Supple, no masses.  No bruit or JVD.  ?Pulmonary:  Good air movement, no audible wheezing, no use of accessory muscles.  ?Cardiac: RRR, normal S1, S2, no Murmurs. ?Vascular:  carotid bruit noted ?Vessel Right Left  ?Radial Palpable Palpable  ?Carotid  Palpable  Palpable  ?PT  Not Palpable Not Palpable  ?DT Not Palpable Not Palpable   ?Gastrointestinal: soft, non-distended. No guarding/no peritoneal signs.  ?Musculoskeletal: M/S 5/5 throughout.  No visible deformity.  ?Neurologic: CN 2-12 intact. Pain and light touch intact in extremities.  Symmetrical.  Speech is fluent. Motor exam as listed above. ?Psychiatric: Judgment intact, Mood & affect appropriate for pt's clinical situation. ?Dermatologic: No rashes or ulcers noted.  No changes consistent with cellulitis. ? ? ?CBC ?Lab Results  ?Component Value Date  ? WBC 11.0 (H) 03/03/2022  ? HGB 11.7 (L) 03/03/2022  ? HCT 34.5 (L) 03/03/2022  ? MCV 103.9 (H) 03/03/2022  ? PLT 84 (L) 03/03/2022  ? ? ?BMET ?   ?Component Value Date/Time   ? NA 142 03/04/2022 0430  ? K 4.7 03/04/2022 0430  ? CL 107 03/04/2022 0430  ? CO2 28 03/04/2022 0430  ? GLUCOSE 135 (H) 03/04/2022 0430  ? BUN 31 (H) 03/04/2022 0430  ? CREATININE 1.38 (H) 03/04/2022 0430  ? CALCIUM 8.1 (L) 03/04/2022 0430  ? GFRNONAA 53 (L) 03/04/2022 0430  ? GFRAA >60 12/20/2018 0429  ? ?CrCl cannot be calculated (Patient's most recent lab result is older than the maximum 21 days allowed.). ? ?COAG ?Lab Results  ?Component Value Date  ? INR 1.08 12/15/2018  ? ? ?Radiology ?DG Chest 2 View ? ?Result Date: 03/01/2022 ?CLINICAL DATA:  Chest pain EXAM: CHEST - 2 VIEW COMPARISON:  None. FINDINGS: Lung volumes are small, but are symmetric and are clear. No pneumothorax or pleural effusion. Cardiac size within normal limits when accounting for poor  pulmonary insufflation. No acute bone abnormality. IMPRESSION: No active cardiopulmonary disease. Electronically Signed   By: Fidela Salisbury M.D.   On: 03/01/2022 21:26  ? ?CT Abdomen Pelvis W Contrast ? ?Result Date: 03/01/2022 ?CLINICAL DATA:  Bowel obstruction.  Right abdominal pain. EXAM: CT ABDOMEN AND PELVIS WITH CONTRAST TECHNIQUE: Multidetector CT imaging of the abdomen and pelvis was performed using the standard protocol following bolus administration of intravenous contrast. RADIATION DOSE REDUCTION: This exam was performed according to the departmental dose-optimization program which includes automated exposure control, adjustment of the mA and/or kV according to patient size and/or use of iterative reconstruction technique. CONTRAST:  158m OMNIPAQUE IOHEXOL 300 MG/ML  SOLN COMPARISON:  None. FINDINGS: Lower chest: Visualized lung bases are clear. Extensive coronary artery calcification. Cardiac size within normal limits. Small hiatal hernia. Circumferential thickening of the distal esophagus may reflect changes of esophagitis. Hepatobiliary: The gallbladder is distended and trace pericholecystic inflammatory changes seen surrounding the neck of  the gallbladder as well as trace pericholecystic fluid seen along the hepatic surface in keeping with changes of acute cholecystitis. Mild hepatic steatosis. No intra or extrahepatic biliary ductal dilation. Pancreas: ULeane Para

## 2023-01-15 ENCOUNTER — Emergency Department: Payer: Medicare Other

## 2023-01-15 ENCOUNTER — Emergency Department
Admission: EM | Admit: 2023-01-15 | Discharge: 2023-01-15 | Disposition: A | Discharge status: Home / Self Care | Payer: Medicare Other | Attending: Emergency Medicine | Admitting: Emergency Medicine

## 2023-01-15 ENCOUNTER — Other Ambulatory Visit: Payer: Self-pay

## 2023-01-15 DIAGNOSIS — R944 Abnormal results of kidney function studies: Secondary | ICD-10-CM | POA: Diagnosis not present

## 2023-01-15 DIAGNOSIS — D72829 Elevated white blood cell count, unspecified: Secondary | ICD-10-CM | POA: Diagnosis not present

## 2023-01-15 DIAGNOSIS — K8050 Calculus of bile duct without cholangitis or cholecystitis without obstruction: Secondary | ICD-10-CM | POA: Insufficient documentation

## 2023-01-15 DIAGNOSIS — R7401 Elevation of levels of liver transaminase levels: Secondary | ICD-10-CM | POA: Diagnosis not present

## 2023-01-15 DIAGNOSIS — R1013 Epigastric pain: Secondary | ICD-10-CM

## 2023-01-15 DIAGNOSIS — I10 Essential (primary) hypertension: Secondary | ICD-10-CM | POA: Insufficient documentation

## 2023-01-15 DIAGNOSIS — E119 Type 2 diabetes mellitus without complications: Secondary | ICD-10-CM | POA: Insufficient documentation

## 2023-01-15 DIAGNOSIS — R1010 Upper abdominal pain, unspecified: Secondary | ICD-10-CM | POA: Diagnosis present

## 2023-01-15 LAB — COMPREHENSIVE METABOLIC PANEL
ALT: 707 U/L — ABNORMAL HIGH (ref 0–44)
AST: 678 U/L — ABNORMAL HIGH (ref 15–41)
Albumin: 4.2 g/dL (ref 3.5–5.0)
Alkaline Phosphatase: 149 U/L — ABNORMAL HIGH (ref 38–126)
Anion gap: 11 (ref 5–15)
BUN: 33 mg/dL — ABNORMAL HIGH (ref 8–23)
CO2: 25 mmol/L (ref 22–32)
Calcium: 9.8 mg/dL (ref 8.9–10.3)
Chloride: 102 mmol/L (ref 98–111)
Creatinine, Ser: 1.19 mg/dL (ref 0.61–1.24)
GFR, Estimated: >60 mL/min (ref >60)
Glucose, Bld: 185 mg/dL — ABNORMAL HIGH (ref 70–99)
Potassium: 4 mmol/L (ref 3.5–5.1)
Sodium: 138 mmol/L (ref 135–145)
Total Bilirubin: 4.4 mg/dL — ABNORMAL HIGH (ref 0.3–1.2)
Total Protein: 7.4 g/dL (ref 6.5–8.1)

## 2023-01-15 LAB — CBC
HCT: 43.6 % (ref 39.0–52.0)
Hemoglobin: 14.7 g/dL (ref 13.0–17.0)
MCH: 35.3 pg — ABNORMAL HIGH (ref 26.0–34.0)
MCHC: 33.7 g/dL (ref 30.0–36.0)
MCV: 104.8 fL — ABNORMAL HIGH (ref 80.0–100.0)
Platelets: 144 10*3/uL — ABNORMAL LOW (ref 150–400)
RBC: 4.16 MIL/uL — ABNORMAL LOW (ref 4.22–5.81)
RDW: 13.3 % (ref 11.5–15.5)
WBC: 11.3 10*3/uL — ABNORMAL HIGH (ref 4.0–10.5)
nRBC: 0 % (ref 0.0–0.2)

## 2023-01-15 LAB — URINALYSIS, ROUTINE W REFLEX MICROSCOPIC
Bilirubin Urine: NEGATIVE
Glucose, UA: NEGATIVE mg/dL
Hgb urine dipstick: NEGATIVE
Ketones, ur: NEGATIVE mg/dL
Leukocytes,Ua: NEGATIVE
Nitrite: NEGATIVE
Protein, ur: NEGATIVE mg/dL
Specific Gravity, Urine: 1.015 (ref 1.005–1.030)
pH: 5 (ref 5.0–8.0)

## 2023-01-15 LAB — TROPONIN I (HIGH SENSITIVITY)
Troponin I (High Sensitivity): 20 ng/L — ABNORMAL HIGH (ref <18)
Troponin I (High Sensitivity): 24 ng/L — ABNORMAL HIGH (ref <18)

## 2023-01-15 LAB — LIPASE, BLOOD: Lipase: 30 U/L (ref 11–51)

## 2023-01-15 MED ORDER — LIDOCAINE VISCOUS HCL 2 % MT SOLN
15.0000 mL | Freq: Once | OROMUCOSAL | Status: AC
  Administered 2023-01-15: 15 mL via ORAL
  Filled 2023-01-15: qty 15

## 2023-01-15 MED ORDER — ALUM & MAG HYDROXIDE-SIMETH 200-200-20 MG/5ML PO SUSP
30.0000 mL | Freq: Once | ORAL | Status: AC
  Administered 2023-01-15: 30 mL via ORAL
  Filled 2023-01-15: qty 30

## 2023-01-15 MED ORDER — SODIUM CHLORIDE 0.9 % IV BOLUS
1000.0000 mL | Freq: Once | INTRAVENOUS | Status: AC
  Administered 2023-01-15: 1000 mL via INTRAVENOUS

## 2023-01-15 MED ORDER — IOHEXOL 300 MG/ML  SOLN
100.0000 mL | Freq: Once | INTRAMUSCULAR | Status: AC | PRN
  Administered 2023-01-15: 100 mL via INTRAVENOUS

## 2023-01-15 MED ORDER — ONDANSETRON 4 MG PO TBDP
4.0000 mg | ORAL_TABLET | Freq: Once | ORAL | Status: AC
  Administered 2023-01-15: 4 mg via ORAL
  Filled 2023-01-15: qty 1

## 2023-01-15 NOTE — ED Notes (Signed)
Flushed pt's IV line and restarted fluids as pt had arm bent too long and fluids had paused running.

## 2023-01-15 NOTE — ED Notes (Signed)
Provider Paduchowski currently at bedside updating pt.

## 2023-01-15 NOTE — ED Triage Notes (Signed)
Pt here with epigastric pain that started last night. Pt states the pain started in the center of his abd and radiates upwards. Pt has some nausea but denies V/D.

## 2023-01-15 NOTE — ED Notes (Addendum)
See triage note. Pt confirms medial/mid and upper abdomen pain and tenderness. Pt also reports HA and states "I haven't eaten all day". Pt's skin dry, resp reg/unlabored and sitting calmly on stretcher.

## 2023-01-15 NOTE — Discharge Instructions (Signed)
Your workup in the emergency department today appears most consistent with a clogged common bile duct which drains the liver.  As we discussed this can be a very dangerous condition and it needs to be treated.  You have elected to go home and return on Thursday at which time the procedure could be completed.  However as we discussed if you have any abdominal pain or fever you need to return to the emergency department immediately.  Do not eat or drink anything past Wednesday at midnight.

## 2023-01-15 NOTE — ED Provider Notes (Signed)
[facility] Provider Note    Event Date/Time   First MD Initiated Contact with Patient 01/15/23 1126     (approximate)  History   Chief Complaint: Abdominal Pain  HPI  Victor Rojas is a 77 y.o. male with a past medical history of GERD reflux, hypertension, hyperlipidemia, diabetes, presents emergency department for upper abdominal pain.  According to the patient since last night he has been experiencing pain across the upper abdomen.  Patient states it felt like indigestion so he tried taking Tums and Alka-Seltzer.  Patient states some relief although the pain is still present however only a 2/10 in severity per patient.  Patient does admit to 1 or 2 beers per day no history of pancreatitis in the past.  Patient is status post cholecystectomy many years ago per patient.  States normal bowel movements, no blood or black.  No vomiting.  No fever.  Physical Exam   Triage Vital Signs: ED Triage Vitals [01/15/23 1005]  Enc Vitals Group     BP (!) 165/84     Pulse Rate 92     Resp 20     Temp 98.6 F (37 C)     Temp Source Oral     SpO2 96 %     Weight      Height      Head Circumference      Peak Flow      Pain Score 5     Pain Loc      Pain Edu?      Excl. in [place]?     Most recent vital signs: Vitals:   01/15/23 1005  BP: (!) 165/84  Pulse: 92  Resp: 20  Temp: 98.6 F (37 C)  SpO2: 96%    General: Awake, no distress.  CV:  Good peripheral perfusion.  Regular rate and rhythm  Resp:  Normal effort.  Equal breath sounds bilaterally.  Abd:  No distention.  Soft, mild epigastric tenderness to palpation without rebound or guarding.     ED Results / Procedures / Treatments   EKG  EKG viewed and interpreted by myself shows a sinus rhythm 89 bpm with a slightly widened QRS, normal axis, slight PR prolongation otherwise normal intervals RSR prime consistent with right bundle branch block.  No concerning ST changes noted.  RADIOLOGY  I  reviewed and interpreted the CT images I do not see any obvious significant abnormality obstruction seen on my evaluation. Radiology has read the CT scan as periumbilical hernia containing fat otherwise no significant findings. Ultrasound shows hepatic steatosis otherwise negative.   MEDICATIONS ORDERED IN ED: Medications  ondansetron (ZOFRAN-ODT) disintegrating tablet 4 mg (has no administration in time range)  sodium chloride 0.9 % bolus 1,000 mL (has no administration in time range)  alum & mag hydroxide-simeth (MAALOX/MYLANTA) 200-200-20 MG/5ML suspension 30 mL (has no administration in time range)    And  lidocaine (XYLOCAINE) 2 % viscous mouth solution 15 mL (has no administration in time range)     IMPRESSION / MDM / ASSESSMENT AND PLAN / ED COURSE  I reviewed the triage vital signs and the nursing notes.  Patient's presentation is most consistent with acute presentation with potential threat to life or bodily function.  Patient presents emergency department for epigastric discomfort since last night.  Overall the patient appears well, mild epigastric tenderness palpation on exam otherwise benign abdomen.  Patient states some relief after using Tums and Alka-Seltzer.  Differential would include reflux/gastritis/esophagitis,  pancreatitis, possibly colitis or diverticulitis.  We will check labs including a cardiac enzyme as a precaution.  We will also check a lipase and LFTs.  We will treat with a GI cocktail as well as Zofran.  Will obtain CT imaging of the abdomen/pelvis to further evaluate.  Patient agreeable to plan of care.  Patient's workup shows slight elevation in troponin however unchanged on repeat.  Lipase is normal.  CBC shows slight leukocytosis otherwise largely normal.  Urinalysis is normal.  Patient's chemistry however does appear to show elevated LFTs with AST of 670 ALT of 700 and a total bilirubin of 4.4.  Patient is status post cholecystectomy.  Right upper quadrant  ultrasound shows hepatic steatosis but no concerning findings.  Patient states he is feeling better.  Radiology has called me back regarding the CT scan they state it appears that the patient has several small punctate stones blocking the distal section of his common bile duct.  This would explain the patient's lab presentation with elevated LFTs and bilirubin.  I spoke with Dr. Haig Prophet of GI medicine states that the patient looks like he would need an ERCP however no one is available till Thursday to proceed with this procedure.  I spoke to the patient and discussed this and the need for the ERCP and the dangers of not getting the ERCP.  I discussed that we need to call the surrounding hospitals such as [facility] or [facility] to see if we can arrange for the ERCP to happen before Thursday if possible.  Alternatively we could admit the patient to this hospital and continue to closely monitor and then proceed with ERCP on Thursday.  Patient states he is "worn out" from being in the hospital all day and wants to go home.  He understands the risk of going home but states he will come back on Thursday to get the procedure.  I had a long discussed with the patient about how we would not recommend doing this as it can be very dangerous and can lead to cholangitis which can be life-threatening, also the pain will very likely return.  Patient understands but still wishes to go home.  I discussed with the patient that if he does wish to go home that we would recommend that he return to the emergency department very early Thursday morning and not eat or drink anything after midnight on Wednesday in case we are able to perform the ERCP more quickly.  I did discuss with the patient that if he chooses to leave he will need a repeat workup from the emergency department including likely labs and possibly additional imaging.  Patient understands but still wishes to go home.  Patient will return Thursday morning.  I discussed very  very strict return precautions if the patient were to spike a fever or the pain returns he is to return to the emergency department immediately.  Again patient understands this is not our wish for him to go home he understands the danger appears to have capacity to make his own decision and wishes to go home.  FINAL CLINICAL IMPRESSION(S) / ED DIAGNOSES   Epigastric pain Choledocholithiasis  Note:  This document was prepared using Dragon voice recognition software and may include unintentional dictation errors.   Harvest Dark, MD 01/15/23 [phone]

## 2023-01-15 NOTE — ED Notes (Signed)
EDP Paduchowski at bedside updating pt.

## 2023-01-15 NOTE — ED Notes (Signed)
Pt away at imaging; will update vital signs once pt back to [facility].

## 2023-01-15 NOTE — ED Notes (Signed)
Pt signed printed d/c paperwork.  

## 2023-01-16 ENCOUNTER — Other Ambulatory Visit: Payer: Self-pay

## 2023-01-16 ENCOUNTER — Encounter (HOSPITAL_COMMUNITY): Payer: Self-pay

## 2023-01-16 ENCOUNTER — Inpatient Hospital Stay (HOSPITAL_COMMUNITY): Payer: Medicare Other

## 2023-01-16 ENCOUNTER — Encounter: Payer: Self-pay | Admitting: Emergency Medicine

## 2023-01-16 ENCOUNTER — Emergency Department
Admission: EM | Admit: 2023-01-16 | Discharge: 2023-01-16 | Disposition: A | Discharge status: Other Acute Inpatient Hospital | Payer: Medicare Other | Attending: Emergency Medicine | Admitting: Emergency Medicine

## 2023-01-16 ENCOUNTER — Inpatient Hospital Stay (HOSPITAL_COMMUNITY)
Admission: AD | Admit: 2023-01-16 | Discharge: 2023-01-21 | DRG: 871 | Disposition: A | Discharge status: Home / Self Care | Payer: Medicare Other | Source: Ambulatory Visit | Attending: Student | Admitting: Student

## 2023-01-16 DIAGNOSIS — K760 Fatty (change of) liver, not elsewhere classified: Secondary | ICD-10-CM | POA: Diagnosis present

## 2023-01-16 DIAGNOSIS — E872 Acidosis, unspecified: Secondary | ICD-10-CM | POA: Diagnosis present

## 2023-01-16 DIAGNOSIS — N170 Acute kidney failure with tubular necrosis: Secondary | ICD-10-CM | POA: Diagnosis present

## 2023-01-16 DIAGNOSIS — K859 Acute pancreatitis without necrosis or infection, unspecified: Secondary | ICD-10-CM | POA: Diagnosis present

## 2023-01-16 DIAGNOSIS — E876 Hypokalemia: Secondary | ICD-10-CM | POA: Diagnosis present

## 2023-01-16 DIAGNOSIS — K7689 Other specified diseases of liver: Secondary | ICD-10-CM | POA: Diagnosis present

## 2023-01-16 DIAGNOSIS — D539 Nutritional anemia, unspecified: Secondary | ICD-10-CM | POA: Diagnosis present

## 2023-01-16 DIAGNOSIS — Z7982 Long term (current) use of aspirin: Secondary | ICD-10-CM

## 2023-01-16 DIAGNOSIS — Z9049 Acquired absence of other specified parts of digestive tract: Secondary | ICD-10-CM

## 2023-01-16 DIAGNOSIS — Z6832 Body mass index (BMI) 32.0-32.9, adult: Secondary | ICD-10-CM | POA: Diagnosis not present

## 2023-01-16 DIAGNOSIS — I10 Essential (primary) hypertension: Secondary | ICD-10-CM | POA: Diagnosis not present

## 2023-01-16 DIAGNOSIS — R791 Abnormal coagulation profile: Secondary | ICD-10-CM | POA: Diagnosis present

## 2023-01-16 DIAGNOSIS — F1090 Alcohol use, unspecified, uncomplicated: Secondary | ICD-10-CM | POA: Diagnosis present

## 2023-01-16 DIAGNOSIS — E1151 Type 2 diabetes mellitus with diabetic peripheral angiopathy without gangrene: Secondary | ICD-10-CM | POA: Diagnosis present

## 2023-01-16 DIAGNOSIS — M109 Gout, unspecified: Secondary | ICD-10-CM | POA: Diagnosis present

## 2023-01-16 DIAGNOSIS — Z7984 Long term (current) use of oral hypoglycemic drugs: Secondary | ICD-10-CM | POA: Diagnosis not present

## 2023-01-16 DIAGNOSIS — A4159 Other Gram-negative sepsis: Principal | ICD-10-CM | POA: Diagnosis present

## 2023-01-16 DIAGNOSIS — Z8249 Family history of ischemic heart disease and other diseases of the circulatory system: Secondary | ICD-10-CM

## 2023-01-16 DIAGNOSIS — R001 Bradycardia, unspecified: Secondary | ICD-10-CM | POA: Diagnosis not present

## 2023-01-16 DIAGNOSIS — E119 Type 2 diabetes mellitus without complications: Secondary | ICD-10-CM | POA: Insufficient documentation

## 2023-01-16 DIAGNOSIS — I452 Bifascicular block: Secondary | ICD-10-CM | POA: Diagnosis present

## 2023-01-16 DIAGNOSIS — R0902 Hypoxemia: Secondary | ICD-10-CM | POA: Diagnosis not present

## 2023-01-16 DIAGNOSIS — Z87442 Personal history of urinary calculi: Secondary | ICD-10-CM

## 2023-01-16 DIAGNOSIS — K8030 Calculus of bile duct with cholangitis, unspecified, without obstruction: Secondary | ICD-10-CM | POA: Diagnosis present

## 2023-01-16 DIAGNOSIS — B9689 Other specified bacterial agents as the cause of diseases classified elsewhere: Secondary | ICD-10-CM | POA: Diagnosis not present

## 2023-01-16 DIAGNOSIS — Z20822 Contact with and (suspected) exposure to covid-19: Secondary | ICD-10-CM | POA: Insufficient documentation

## 2023-01-16 DIAGNOSIS — R42 Dizziness and giddiness: Secondary | ICD-10-CM | POA: Diagnosis present

## 2023-01-16 DIAGNOSIS — R7989 Other specified abnormal findings of blood chemistry: Secondary | ICD-10-CM | POA: Diagnosis present

## 2023-01-16 DIAGNOSIS — E860 Dehydration: Secondary | ICD-10-CM | POA: Diagnosis present

## 2023-01-16 DIAGNOSIS — A419 Sepsis, unspecified organism: Secondary | ICD-10-CM | POA: Insufficient documentation

## 2023-01-16 DIAGNOSIS — I2510 Atherosclerotic heart disease of native coronary artery without angina pectoris: Secondary | ICD-10-CM | POA: Diagnosis present

## 2023-01-16 DIAGNOSIS — K8309 Other cholangitis: Secondary | ICD-10-CM | POA: Diagnosis not present

## 2023-01-16 DIAGNOSIS — E669 Obesity, unspecified: Secondary | ICD-10-CM | POA: Diagnosis present

## 2023-01-16 DIAGNOSIS — I119 Hypertensive heart disease without heart failure: Secondary | ICD-10-CM | POA: Diagnosis present

## 2023-01-16 DIAGNOSIS — E1365 Other specified diabetes mellitus with hyperglycemia: Secondary | ICD-10-CM | POA: Diagnosis not present

## 2023-01-16 DIAGNOSIS — E785 Hyperlipidemia, unspecified: Secondary | ICD-10-CM | POA: Diagnosis present

## 2023-01-16 DIAGNOSIS — D6949 Other primary thrombocytopenia: Secondary | ICD-10-CM | POA: Diagnosis present

## 2023-01-16 DIAGNOSIS — N179 Acute kidney failure, unspecified: Secondary | ICD-10-CM | POA: Diagnosis not present

## 2023-01-16 DIAGNOSIS — K219 Gastro-esophageal reflux disease without esophagitis: Secondary | ICD-10-CM | POA: Diagnosis present

## 2023-01-16 DIAGNOSIS — E1165 Type 2 diabetes mellitus with hyperglycemia: Secondary | ICD-10-CM | POA: Insufficient documentation

## 2023-01-16 DIAGNOSIS — R748 Abnormal levels of other serum enzymes: Secondary | ICD-10-CM | POA: Diagnosis not present

## 2023-01-16 DIAGNOSIS — Z87891 Personal history of nicotine dependence: Secondary | ICD-10-CM | POA: Diagnosis not present

## 2023-01-16 DIAGNOSIS — D7589 Other specified diseases of blood and blood-forming organs: Secondary | ICD-10-CM | POA: Diagnosis present

## 2023-01-16 DIAGNOSIS — R6521 Severe sepsis with septic shock: Secondary | ICD-10-CM | POA: Diagnosis not present

## 2023-01-16 DIAGNOSIS — R0789 Other chest pain: Secondary | ICD-10-CM | POA: Diagnosis present

## 2023-01-16 DIAGNOSIS — Z79899 Other long term (current) drug therapy: Secondary | ICD-10-CM

## 2023-01-16 DIAGNOSIS — D6959 Other secondary thrombocytopenia: Secondary | ICD-10-CM | POA: Diagnosis present

## 2023-01-16 DIAGNOSIS — D696 Thrombocytopenia, unspecified: Secondary | ICD-10-CM | POA: Diagnosis not present

## 2023-01-16 DIAGNOSIS — I2489 Other forms of acute ischemic heart disease: Secondary | ICD-10-CM | POA: Diagnosis present

## 2023-01-16 DIAGNOSIS — K8021 Calculus of gallbladder without cholecystitis with obstruction: Secondary | ICD-10-CM | POA: Diagnosis not present

## 2023-01-16 DIAGNOSIS — Z86010 Personal history of colonic polyps: Secondary | ICD-10-CM

## 2023-01-16 DIAGNOSIS — D6489 Other specified anemias: Secondary | ICD-10-CM | POA: Diagnosis present

## 2023-01-16 DIAGNOSIS — K8050 Calculus of bile duct without cholangitis or cholecystitis without obstruction: Secondary | ICD-10-CM | POA: Diagnosis not present

## 2023-01-16 LAB — COMPREHENSIVE METABOLIC PANEL
ALT: 505 U/L — ABNORMAL HIGH (ref 0–44)
ALT: 672 U/L — ABNORMAL HIGH (ref 0–44)
AST: 324 U/L — ABNORMAL HIGH (ref 15–41)
AST: 611 U/L — ABNORMAL HIGH (ref 15–41)
Albumin: 3.5 g/dL (ref 3.5–5.0)
Albumin: 2.8 g/dL — ABNORMAL LOW (ref 3.5–5.0)
Alkaline Phosphatase: 151 U/L — ABNORMAL HIGH (ref 38–126)
Alkaline Phosphatase: 119 U/L (ref 38–126)
Anion gap: 16 — ABNORMAL HIGH (ref 5–15)
Anion gap: 14 (ref 5–15)
BUN: 35 mg/dL — ABNORMAL HIGH (ref 8–23)
BUN: 31 mg/dL — ABNORMAL HIGH (ref 8–23)
CO2: 18 mmol/L — ABNORMAL LOW (ref 22–32)
CO2: 24 mmol/L (ref 22–32)
Calcium: 8.7 mg/dL — ABNORMAL LOW (ref 8.9–10.3)
Calcium: 8.3 mg/dL — ABNORMAL LOW (ref 8.9–10.3)
Chloride: 102 mmol/L (ref 98–111)
Chloride: 99 mmol/L (ref 98–111)
Creatinine, Ser: 2.03 mg/dL — ABNORMAL HIGH (ref 0.61–1.24)
Creatinine, Ser: 1.94 mg/dL — ABNORMAL HIGH (ref 0.61–1.24)
GFR, Estimated: 33 mL/min — ABNORMAL LOW (ref >60)
GFR, Estimated: 35 mL/min — ABNORMAL LOW (ref >60)
Glucose, Bld: 123 mg/dL — ABNORMAL HIGH (ref 70–99)
Glucose, Bld: 156 mg/dL — ABNORMAL HIGH (ref 70–99)
Potassium: 3.2 mmol/L — ABNORMAL LOW (ref 3.5–5.1)
Potassium: 4.3 mmol/L (ref 3.5–5.1)
Sodium: 136 mmol/L (ref 135–145)
Sodium: 137 mmol/L (ref 135–145)
Total Bilirubin: 5.8 mg/dL — ABNORMAL HIGH (ref 0.3–1.2)
Total Bilirubin: 5.5 mg/dL — ABNORMAL HIGH (ref 0.3–1.2)
Total Protein: 6.2 g/dL — ABNORMAL LOW (ref 6.5–8.1)
Total Protein: 5.3 g/dL — ABNORMAL LOW (ref 6.5–8.1)

## 2023-01-16 LAB — GLUCOSE, CAPILLARY
Glucose-Capillary: 140 mg/dL — ABNORMAL HIGH (ref 70–99)
Glucose-Capillary: 122 mg/dL — ABNORMAL HIGH (ref 70–99)
Glucose-Capillary: 106 mg/dL — ABNORMAL HIGH (ref 70–99)
Glucose-Capillary: 79 mg/dL (ref 70–99)

## 2023-01-16 LAB — LACTIC ACID, PLASMA
Lactic Acid, Venous: 6.1 mmol/L (ref 0.5–1.9)
Lactic Acid, Venous: 5.3 mmol/L (ref 0.5–1.9)
Lactic Acid, Venous: 3.5 mmol/L (ref 0.5–1.9)
Lactic Acid, Venous: 4.3 mmol/L (ref 0.5–1.9)

## 2023-01-16 LAB — TYPE AND SCREEN
ABO/RH(D): A POS
Antibody Screen: NEGATIVE

## 2023-01-16 LAB — CBC WITH DIFFERENTIAL/PLATELET
Abs Immature Granulocytes: 0.09 10*3/uL — ABNORMAL HIGH (ref 0.00–0.07)
Basophils Absolute: 0 10*3/uL (ref 0.0–0.1)
Basophils Relative: 0 %
Eosinophils Absolute: 0 10*3/uL (ref 0.0–0.5)
Eosinophils Relative: 0 %
HCT: 40.9 % (ref 39.0–52.0)
Hemoglobin: 13.7 g/dL (ref 13.0–17.0)
Immature Granulocytes: 1 %
Lymphocytes Relative: 2 %
Lymphs Abs: 0.3 10*3/uL — ABNORMAL LOW (ref 0.7–4.0)
MCH: 35.8 pg — ABNORMAL HIGH (ref 26.0–34.0)
MCHC: 33.5 g/dL (ref 30.0–36.0)
MCV: 106.8 fL — ABNORMAL HIGH (ref 80.0–100.0)
Monocytes Absolute: 0.5 10*3/uL (ref 0.1–1.0)
Monocytes Relative: 4 %
Neutro Abs: 12.8 10*3/uL — ABNORMAL HIGH (ref 1.7–7.7)
Neutrophils Relative %: 93 %
Platelets: 112 10*3/uL — ABNORMAL LOW (ref 150–400)
RBC: 3.83 MIL/uL — ABNORMAL LOW (ref 4.22–5.81)
RDW: 14 % (ref 11.5–15.5)
WBC: 13.7 10*3/uL — ABNORMAL HIGH (ref 4.0–10.5)
nRBC: 0 % (ref 0.0–0.2)

## 2023-01-16 LAB — CORTISOL: Cortisol, Plasma: 36.5 ug/dL

## 2023-01-16 LAB — LIPASE, BLOOD
Lipase: 25 U/L (ref 11–51)
Lipase: 21 U/L (ref 11–51)

## 2023-01-16 LAB — RESP PANEL BY RT-PCR (RSV, FLU A&B, COVID)  RVPGX2
Influenza A by PCR: NEGATIVE
Influenza B by PCR: NEGATIVE
Resp Syncytial Virus by PCR: NEGATIVE
SARS Coronavirus 2 by RT PCR: NEGATIVE

## 2023-01-16 LAB — APTT
aPTT: 37 seconds — ABNORMAL HIGH (ref 24–36)
aPTT: 37 seconds — ABNORMAL HIGH (ref 24–36)

## 2023-01-16 LAB — PROTIME-INR
INR: 1.5 — ABNORMAL HIGH (ref 0.8–1.2)
INR: 1.7 — ABNORMAL HIGH (ref 0.8–1.2)
Prothrombin Time: 18.3 seconds — ABNORMAL HIGH (ref 11.4–15.2)
Prothrombin Time: 19.9 seconds — ABNORMAL HIGH (ref 11.4–15.2)

## 2023-01-16 LAB — TROPONIN I (HIGH SENSITIVITY)
Troponin I (High Sensitivity): 2084 ng/L (ref <18)
Troponin I (High Sensitivity): 2235 ng/L (ref <18)
Troponin I (High Sensitivity): 3925 ng/L (ref <18)
Troponin I (High Sensitivity): 4032 ng/L (ref <18)

## 2023-01-16 LAB — PROCALCITONIN: Procalcitonin: 32.62 ng/mL

## 2023-01-16 LAB — MRSA NEXT GEN BY PCR, NASAL: MRSA by PCR Next Gen: NOT DETECTED

## 2023-01-16 MED ORDER — LACTATED RINGERS IV BOLUS (SEPSIS)
1000.0000 mL | Freq: Once | INTRAVENOUS | Status: AC
  Administered 2023-01-16: 1000 mL via INTRAVENOUS

## 2023-01-16 MED ORDER — VITAMIN B-12 1000 MCG PO TABS: 1000.0000 ug | ORAL_TABLET | Freq: Every day | ORAL | Status: DC

## 2023-01-16 MED ORDER — ALLOPURINOL 300 MG PO TABS: 300.0000 mg | ORAL_TABLET | Freq: Every day | ORAL | Status: DC

## 2023-01-16 MED ORDER — METRONIDAZOLE 500 MG/100ML IV SOLN
500.0000 mg | Freq: Once | INTRAVENOUS | Status: AC
  Administered 2023-01-16: 500 mg via INTRAVENOUS
  Filled 2023-01-16: qty 100

## 2023-01-16 MED ORDER — SODIUM CHLORIDE 0.9 % IV SOLN
250.0000 mL | INTRAVENOUS | Status: DC
  Administered 2023-01-17: 250 mL via INTRAVENOUS

## 2023-01-16 MED ORDER — GADOBUTROL 1 MMOL/ML IV SOLN
9.0000 mL | Freq: Once | INTRAVENOUS | Status: AC | PRN
  Administered 2023-01-16: 9 mL via INTRAVENOUS

## 2023-01-16 MED ORDER — VITAMIN C 500 MG PO TABS
500.0000 mg | ORAL_TABLET | Freq: Every day | ORAL | Status: DC
  Administered 2023-01-17 – 2023-01-21 (×5): 500 mg via ORAL
  Filled 2023-01-16 (×5): qty 1

## 2023-01-16 MED ORDER — METRONIDAZOLE 500 MG/100ML IV SOLN
500.0000 mg | Freq: Two times a day (BID) | INTRAVENOUS | Status: DC
  Administered 2023-01-16 – 2023-01-17 (×2): 500 mg via INTRAVENOUS
  Filled 2023-01-16 (×2): qty 100

## 2023-01-16 MED ORDER — VITAMIN D 25 MCG (1000 UNIT) PO TABS
1000.0000 [IU] | ORAL_TABLET | Freq: Every day | ORAL | Status: DC
  Administered 2023-01-17 – 2023-01-21 (×5): 1000 [IU] via ORAL
  Filled 2023-01-16 (×5): qty 1

## 2023-01-16 MED ORDER — OXYCODONE HCL 5 MG PO TABS
5.0000 mg | ORAL_TABLET | Freq: Four times a day (QID) | ORAL | Status: DC | PRN
  Administered 2023-01-16 – 2023-01-17 (×2): 5 mg via ORAL
  Filled 2023-01-16 (×2): qty 1

## 2023-01-16 MED ORDER — FAMOTIDINE IN NACL 20-0.9 MG/50ML-% IV SOLN
20.0000 mg | Freq: Two times a day (BID) | INTRAVENOUS | Status: DC
  Filled 2023-01-16: qty 50

## 2023-01-16 MED ORDER — VITAMIN C 500 MG PO TABS: 500.0000 mg | ORAL_TABLET | Freq: Every day | ORAL | Status: DC

## 2023-01-16 MED ORDER — ASPIRIN 81 MG PO TBEC
81.0000 mg | DELAYED_RELEASE_TABLET | Freq: Every day | ORAL | Status: DC
  Administered 2023-01-16 – 2023-01-21 (×6): 81 mg via ORAL
  Filled 2023-01-16 (×6): qty 1

## 2023-01-16 MED ORDER — SODIUM CHLORIDE 0.9 % IV SOLN
2.0000 g | Freq: Two times a day (BID) | INTRAVENOUS | Status: DC
  Administered 2023-01-16 – 2023-01-21 (×10): 2 g via INTRAVENOUS
  Filled 2023-01-16 (×10): qty 12.5

## 2023-01-16 MED ORDER — SODIUM CHLORIDE 0.9 % IV SOLN
2.0000 g | Freq: Once | INTRAVENOUS | Status: AC
  Administered 2023-01-16: 2 g via INTRAVENOUS
  Filled 2023-01-16: qty 12.5

## 2023-01-16 MED ORDER — VITAMIN D 25 MCG (1000 UNIT) PO TABS: 1000.0000 [IU] | ORAL_TABLET | Freq: Every day | ORAL | Status: DC

## 2023-01-16 MED ORDER — INSULIN ASPART 100 UNIT/ML IJ SOLN
0.0000 [IU] | INTRAMUSCULAR | Status: DC
  Administered 2023-01-16 – 2023-01-17 (×2): 2 [IU] via SUBCUTANEOUS
  Administered 2023-01-17: 5 [IU] via SUBCUTANEOUS
  Administered 2023-01-18: 3 [IU] via SUBCUTANEOUS
  Administered 2023-01-18: 5 [IU] via SUBCUTANEOUS

## 2023-01-16 MED ORDER — VITAMIN B-12 1000 MCG PO TABS
1000.0000 ug | ORAL_TABLET | Freq: Every day | ORAL | Status: DC
  Administered 2023-01-17 – 2023-01-21 (×5): 1000 ug via ORAL
  Filled 2023-01-16 (×5): qty 1

## 2023-01-16 MED ORDER — ADULT MULTIVITAMIN W/MINERALS CH
1.0000 | ORAL_TABLET | Freq: Every day | ORAL | Status: DC
  Administered 2023-01-17 – 2023-01-21 (×5): 1 via ORAL
  Filled 2023-01-16 (×5): qty 1

## 2023-01-16 MED ORDER — LACTATED RINGERS IV SOLN: INTRAVENOUS | Status: DC

## 2023-01-16 MED ORDER — SODIUM CHLORIDE 0.9 % IV BOLUS (SEPSIS)
1000.0000 mL | Freq: Once | INTRAVENOUS | Status: AC
  Administered 2023-01-16: 1000 mL via INTRAVENOUS

## 2023-01-16 MED ORDER — ADULT MULTIVITAMIN W/MINERALS CH: 1.0000 | ORAL_TABLET | Freq: Every day | ORAL | Status: DC

## 2023-01-16 MED ORDER — NOREPINEPHRINE 4 MG/250ML-% IV SOLN
0.0000 ug/min | INTRAVENOUS | Status: DC
  Administered 2023-01-16: 2 ug/min via INTRAVENOUS
  Filled 2023-01-16: qty 250

## 2023-01-16 MED ORDER — FAMOTIDINE IN NACL 20-0.9 MG/50ML-% IV SOLN
20.0000 mg | INTRAVENOUS | Status: DC
  Administered 2023-01-16: 20 mg via INTRAVENOUS
  Filled 2023-01-16: qty 50

## 2023-01-16 MED ORDER — NOREPINEPHRINE 4 MG/250ML-% IV SOLN: 2.0000 ug/min | INTRAVENOUS | Status: DC

## 2023-01-16 MED ORDER — ATORVASTATIN CALCIUM 40 MG PO TABS: 40.0000 mg | ORAL_TABLET | Freq: Every day | ORAL | Status: DC

## 2023-01-16 NOTE — Consult Note (Addendum)
Consultation Note   Referring Provider:  PCCM PCP: Baxter Hire, MD Primary Gastroenterologist: [facility]        Reason for consultation: Possible cholangitis   Hospital Day: 1  Assessment    # 77 yo male transferred from [facility] ED with sepsis / presumed cholangitis. Mild prominence of CBD ( post-cholecystectomy state) and mild increased density within distal common bile duct on CT scan. Need to evaluate for choledocholithiasis.   # Elevated troponin. Level hasn't peaked ( 24>>2084> 2235) . Related to sepsis? History of heart disease?   # AKI. Cr 1.19  >> 2.03   # Chronic thrombocytopenia. Platelets stable at 112.  # History of colon polyps. Followed by [facility]  # See PMH for additional medical problems   Plan  PCCM will be discontinuing pressors soon.  Continue flagyl and maxipime MRCP today Tentatively scheduled for ERCP tomorrow at 1pm.  The benefits and risks of ERCP with possible sphincterotomy, not limited to cardiopulmonary complications of sedation, bleeding, infection, perforation,and pancreatitis were discussed with the patient who agrees to proceed.    HPI   Patient is a   77 yo male with a PMH of  GERD, HTN, PVD, HLD, DM, gout, adenomatous colon polyps, remote cholecystectomy.   See PMH for any additional medical problems.  Mr. Bard presented to [facility] ED yesterday with non-radiating  epigastric pain. The pain started Monday, it wasn't severe but he couldn't get comfortable. Pain felt like indigestion. He took some Tums and Alka-Seltzer which helped but didn't alleviate the pain.  He had no associated N/V. He went to [facility] ED. Sounds like admission was recommended but he wanted to go home.  He returned to ED with worsening pain.   Significant Studies thus far: WBC 13.7, normal hgb, MCV 106, platelets 112 K+ 3.2, BUN 35, Cr 2.03 Lactic acid 6.1 >> 5.3 Troponin 24 > >2,084 >> 2235  Alk phos 151, Tbii  5.8, AST 324 ( down from 678), ALT 505 ( down from 707).   RUQ >> CBC diameter within normal limits after cholecystectomy  CT AP with contrast >>Minimal central intrahepatic biliary ductal dilatation and mild prominence of the common bile duct. There is mild increased density within the lumen of the distal common bile duct which may represent a cluster of common bile duct stones   Previous GI Evaluation     Recent Labs and Imaging CT ABDOMEN PELVIS W CONTRAST  Addendum Date: 01/15/2023   ADDENDUM REPORT: 01/15/2023 15:21 ADDENDUM: There is mild increased density within the lumen of the distal common bile duct that appears new compared to the 03/01/2022 CT prior to the interval cholecystectomy (current CT axial series 3 images 33 through 38) which may represent a cluster of common bile duct stones measuring up to approximately 4 mm in transverse dimension and 11 mm in craniocaudal length (coronal series 5, image 45). Critical Value/emergent results were called by telephone at the time of interpretation on 01/15/2023 at 3:15 pm to provider [facility] , who verbally acknowledged these results. Electronically Signed   By: Yvonne Kendall M.D.   On: 01/15/2023 15:21   Result Date: 01/15/2023 CLINICAL DATA:  Epigastric pain. Started last night. Pain thyroid and center of abdomen  and radiates upwards. Some nausea. EXAM: CT ABDOMEN AND PELVIS WITH CONTRAST TECHNIQUE: Multidetector CT imaging of the abdomen and pelvis was performed using the standard protocol following bolus administration of intravenous contrast. RADIATION DOSE REDUCTION: This exam was performed according to the departmental dose-optimization program which includes automated exposure control, adjustment of the mA and/or kV according to patient size and/or use of iterative reconstruction technique. CONTRAST:  169m OMNIPAQUE IOHEXOL 300 MG/ML  SOLN COMPARISON:  Right upper quadrant abdominal ultrasound 03/01/2022; CT abdomen and pelvis  03/01/2022 FINDINGS: Lower chest: There is mild curvilinear and ground-glass likely subsegmental atelectasis and/or scarring within the bilateral lung bases. Minimal left foraminal right posterior pleural thickening appears unchanged from prior there is no definite pleural effusion. Dense coronary artery calcifications are again noted. No pericardial effusion. Cardiac silhouette is again at the upper limits of normal size to mildly enlarged. Possible minimal circumferential esophageal thickening is unchanged to mildly improved from prior and may be secondary to underdistention versus the sequela of prior esophagitis. Hepatobiliary: Smooth liver contours. Interval cholecystectomy. Minimal central intrahepatic biliary ductal dilatation and mild prominence of the common bile duct, likely normal postsurgical changes following cholecystectomy. No focal liver lesion is seen. Pancreas: Unremarkable. No pancreatic ductal dilatation or surrounding inflammatory changes. Spleen: Unchanged two individual calcifications within the spleen, possibly the sequela of remote granulomatous infection. Adrenals/Urinary Tract: Normal adrenals. There is again mild high-grade atrophy of the left kidney, chronic. There again may be minimally delayed left renal cortical enhancement compared to the right. Vascular calcifications are again seen within the bilateral renal hila. No hydronephrosis. No renal or ureteral stones are seen. The bilateral ureters are normal in caliber. No focal urinary bladder wall thickening. Stomach/Bowel: The terminal ileum is unremarkable. Normal appendix. No dilated loops of bowel are seen to indicate bowel obstruction. No bowel wall thickening. Vascular/Lymphatic: No abdominal aortic aneurysm. High-grade atherosclerotic calcifications within the aorta, bilateral iliac arteries, and origins of the major intra-abdominal aortic branch vessels, similar to prior. No mesenteric, retroperitoneal, or pelvic  lymphadenopathy. Reproductive: The prostate is again mildly enlarged. The seminal vesicles are grossly unremarkable. Other: A fat containing left paraumbilical hernia is again seen with orifice measuring up to 11 mm and ventral herniated fat measuring up to approximately 4.7 cm in craniocaudal dimension and 5.1 cm in transverse dimension. This is similar in size to prior but there is new mild inflammatory stranding within the herniated fat (axial series 3 images 54 through 62). Small fat containing right inguinal hernia is unchanged. No free air or free fluid is seen within the abdomen or pelvis. Musculoskeletal: Moderate multilevel degenerative disc changes of the thoracic spine. Mild levocurvature centered at L3. Mild chronic anterior T11 vertebral body height loss. Large anterior T9-10 and T10-11 bridging osteophytes. IMPRESSION: Compared to 03/01/2022: 1. Interval cholecystectomy. 2. Unchanged size of fat containing left paraumbilical hernia. There is new mild inflammatory stranding within the herniated fat. This may represent a source of pain. Recommend clinical correlation. 3. Unchanged chronic left renal atrophy. 4. Unchanged severe atherosclerotic calcifications within the aorta, bilateral iliac arteries, and origins of the major intra-abdominal aortic branch vessels. Electronically Signed: By: RYvonne KendallM.D. On: 01/15/2023 12:57   [place] LIMITED RUQ (LIVER/GB)  Result Date: 01/15/2023 CLINICAL DATA:  Elevated liver function tests EXAM: ULTRASOUND ABDOMEN LIMITED RIGHT UPPER QUADRANT COMPARISON:  CT of earlier today. FINDINGS: Gallbladder: Surgically absent Common bile duct: Diameter: Within normal limits after cholecystectomy, 8 mm. Liver: Mildly heterogeneously increased hepatic echogenicity. Portal vein is  patent on color Doppler imaging with normal direction of blood flow towards the liver. Other: None. IMPRESSION: Suspect mild hepatic steatosis. No other explanation for elevated liver  function tests. Electronically Signed   By: Abigail Miyamoto M.D.   On: 01/15/2023 15:05    Labs:  Recent Labs    01/15/23 1011 01/16/23 0645  WBC 11.3* 13.7*  HGB 14.7 13.7  HCT 43.6 40.9  PLT 144* 112*   Recent Labs    01/15/23 1011 01/16/23 0622  NA 138 136  K 4.0 3.2*  CL 102 102  CO2 25 18*  GLUCOSE 185* 123*  BUN 33* 35*  CREATININE 1.19 2.03*  CALCIUM 9.8 8.7*   Recent Labs    01/16/23 0622  PROT 6.2*  ALBUMIN 3.5  AST 324*  ALT 505*  ALKPHOS 151*  BILITOT 5.8*   No results for input(s): "HEPBSAG", "HCVAB", "HEPAIGM", "HEPBIGM" in the last 72 hours. Recent Labs    01/16/23 0734  LABPROT 18.3*  INR 1.5*    Past Medical History:  Diagnosis Date   Adenoma of colon    Arthritis    Diabetes mellitus without complication (HCC)    Diverticulosis    GERD (gastroesophageal reflux disease)    Hiatal hernia    Hiatal hernia    History of gout    History of kidney stones    Hyperlipemia    Hypertension    Peripheral vascular disease ([place])    Pre-diabetes     Past Surgical History:  Procedure Laterality Date   COLONOSCOPY     COLONOSCOPY WITH PROPOFOL N/A 06/18/2016   Procedure: COLONOSCOPY WITH PROPOFOL;  Surgeon: Lollie Sails, MD;  Location: [facility] ENDOSCOPY;  Service: Endoscopy;  Laterality: N/A;   COLONOSCOPY WITH PROPOFOL N/A 01/09/2021   Procedure: COLONOSCOPY WITH PROPOFOL;  Surgeon: Lesly Rubenstein, MD;  Location: [facility] ENDOSCOPY;  Service: Endoscopy;  Laterality: N/A;   ENDARTERECTOMY Right 12/19/2018   Procedure: ENDARTERECTOMY CAROTID;  Surgeon: Katha Cabal, MD;  Location: [facility] ORS;  Service: Vascular;  Laterality: Right;   ESOPHAGOGASTRODUODENOSCOPY (EGD) WITH PROPOFOL N/A 06/18/2016   Procedure: ESOPHAGOGASTRODUODENOSCOPY (EGD) WITH PROPOFOL;  Surgeon: Lollie Sails, MD;  Location[facility] ENDOSCOPY;  Service: Endoscopy;  Laterality: N/A;   ESOPHAGOGASTRODUODENOSCOPY (EGD) WITH PROPOFOL N/A 01/09/2021   Procedure:  ESOPHAGOGASTRODUODENOSCOPY (EGD) WITH PROPOFOL;  Surgeon: Lesly Rubenstein, MD;  Location: [facility] ENDOSCOPY;  Service: Endoscopy;  Laterality: N/A;   HERNIA REPAIR     umbilical   TONSILLECTOMY      Family History  Problem Relation Age of Onset   Cataracts Mother    Heart attack Mother    Cancer Sister     Prior to Admission medications   Medication Sig Start Date End Date Taking? Authorizing Provider  allopurinol (ZYLOPRIM) 300 MG tablet Take 300 mg by mouth daily.    [provider]  amLODipine (NORVASC) 2.5 MG tablet Take 2.5 mg by mouth daily.    [provider]  aspirin EC 81 MG tablet Take 81 mg by mouth daily.     [provider]  atorvastatin (LIPITOR) 40 MG tablet Take 40 mg by mouth daily. 01/29/22   [provider]  Cholecalciferol (D3-1000) 25 MCG (1000 UT) tablet Take 1,000 Units by mouth daily.    [provider]  diphenhydrAMINE (BENADRYL) 25 mg capsule Take 25 mg by mouth daily.    [provider]  lisinopril-hydrochlorothiazide (PRINZIDE,ZESTORETIC) 20-12.5 MG tablet Take 2 tablets by mouth daily.  [provider]  metFORMIN (GLUCOPHAGE) 500 MG tablet Take 500 mg by mouth 2 (two) times daily. 09/02/21   [provider]  metoprolol tartrate (LOPRESSOR) 25 MG tablet Take 25 mg by mouth 2 (two) times daily.    [provider]  Multiple Vitamin (MULTI-VITAMINS) TABS Take 1 tablet by mouth daily.     [provider]  pantoprazole (PROTONIX) 40 MG tablet Take 40 mg by mouth daily. 09/12/21   [provider]  vitamin B-12 (CYANOCOBALAMIN) 1000 MCG tablet Take 1,000 mcg by mouth daily.    [provider]  vitamin C (ASCORBIC ACID) 500 MG tablet Take 500 mg by mouth daily.    [provider]    Current Facility-Administered Medications  Medication Dose Route Frequency Provider Last Rate Last Admin   0.9 %  sodium chloride infusion  250 mL Intravenous Continuous  Byrum, Rose Fillers, MD       [START ON 01/17/2023] allopurinol (ZYLOPRIM) tablet 300 mg  300 mg Oral Daily Collene Gobble, MD       [START ON 01/17/2023] ascorbic acid (VITAMIN C) tablet 500 mg  500 mg Oral Daily Byrum, Rose Fillers, MD       aspirin EC tablet 81 mg  81 mg Oral Daily Byrum, Rose Fillers, MD       atorvastatin (LIPITOR) tablet 40 mg  40 mg Oral Daily Byrum, Rose Fillers, MD       ceFEPIme (MAXIPIME) 2 g in sodium chloride 0.9 % 100 mL IVPB  2 g Intravenous Q12H Wilson Singer I, RPH       [START ON 01/17/2023] cholecalciferol (VITAMIN D3) 25 MCG (1000 UNIT) tablet 1,000 Units  1,000 Units Oral Daily Collene Gobble, MD       [START ON 01/17/2023] cyanocobalamin (VITAMIN B12) tablet 1,000 mcg  1,000 mcg Oral Daily Collene Gobble, MD       metroNIDAZOLE (FLAGYL) IVPB 500 mg  500 mg Intravenous Q12H Collene Gobble, MD       Derrill Memo ON 01/17/2023] multivitamin with minerals tablet 1 tablet  1 tablet Oral Daily Byrum, Rose Fillers, MD       norepinephrine (LEVOPHED) '4mg'$  in 214m (0.016 mg/mL) premix infusion  2-10 mcg/min Intravenous Titrated BCollene Gobble MD        Allergies as of 01/16/2023   (No Known Allergies)    Social History   Socioeconomic History   Marital status: Married    Spouse name: Not on file   Number of children: Not on file   Years of education: Not on file   Highest education level: Not on file  Occupational History   Not on file  Tobacco Use   Smoking status: Former    Types: Cigarettes    Quit date: 02/03/1989    Years since quitting: 33.9   Smokeless tobacco: Never  Vaping Use   Vaping Use: Never used  Substance and Sexual Activity   Alcohol use: Yes    Alcohol/week: 6.0 standard drinks of alcohol    Types: 6 Cans of beer per week   Drug use: No   Sexual activity: Not on file  Other Topics Concern   Not on file  Social History Narrative   Not on file   Social Determinants of Health   Financial Resource Strain: Not on file  Food Insecurity: Not on file   Transportation Needs: Not on file  Physical Activity: Not on file  Stress: Not on file  Social Connections:  Not on file  Intimate Partner Violence: Not on file    Review of Systems: All systems reviewed and negative except where noted in HPI.  Physical Exam: Vital signs in last 24 hours: Temp:  [97.4 F (36.3 C)-98.2 F (36.8 C)] 98.2 F (36.8 C) (02/07 1241) Pulse Rate:  [50-105] 90 (02/07 1330) Resp:  [16-26] 20 (02/07 1330) BP: (70-146)/(46-84) 117/61 (02/07 1330) SpO2:  [91 %-100 %] 96 % (02/07 1330) Weight:  [94.3 kg] 94.3 kg (02/07 9179)    General:  Alert male in NAD Psych:  Pleasant, cooperative. Normal mood and affect Eyes: Pupils equal Ears:  Normal auditory acuity Nose: No deformity, discharge or lesions Neck:  Supple, no masses felt Lungs:  Clear to auscultation.  Heart:  Regular rate, regular rhythm.  Abdomen:  Soft, nondistended, nontender, active bowel sounds, no masses felt Rectal :  Deferred Msk: Symmetrical without gross deformities.  Neurologic:  Alert, oriented, grossly normal neurologically Extremities : No edema Skin:  Intact without significant lesions.    Intake/Output from previous day: No intake/output data recorded. Intake/Output this shift:  No intake/output data recorded.    Active Problems:   * No active hospital problems. Tye Savoy, NP-C @  01/16/2023, 1:39 PM   Attending physician's note   I have taken history, reviewed the chart and examined the patient. I performed a substantive portion of this encounter, including complete performance of at least one of the key components, in conjunction with the APP. I agree with the Advanced Practitioner's note, impression and recommendations.   Suspected cholangitis with shock.  Responding well to cefepime/Flagyl/IVF. H/O lap chole 2022 Elevated troponin- d/t sepsis or cardiac etiology.  Plan: -MRCP -ERCP in AM.  No spots available today.  Besides, he does need  stabilization given elevated troponin/hypotension -Trend CBC, CMP -Follow blood cultures.  I have explained risks and benefits of ERCP in detail to the patient's family including risks of bleeding, perforation, pancreatitis.  The benefits were also discussed.  They wish to proceed.  Carmell Austria, MD [facility] [phone]

## 2023-01-16 NOTE — H&P (Signed)
NAME:  Victor Rojas, MRN:  [id number], DOB:  [birth year], LOS: 0 ADMISSION DATE:  01/16/2023,  CHIEF COMPLAINT: Choledocholithiasis, septic shock  History of Present Illness:  77 year old male with a history of diabetes, hypertension, hyperlipidemia, alcohol use.  He had a cholecystectomy in 2022.  Evaluated at [facility] for abdominal pain 2/6 where he had a significant transaminitis and a CT abdomen that was concerning for choledocholithiasis.  He left AMA but then returned on 2/7 with lightheadedness, dizziness feeling worse.  His evaluation was significant for evolving shock with an elevated lactic acid, elevated troponin.  He was treated with aggressive IV fluids, empiric broad-spectrum antibiotics for presumed cholangitis and septic shock.  He transfers now to [facility] for further care.  Pertinent  Medical History   Past Medical History:  Diagnosis Date   Adenoma of colon    Arthritis    Diabetes mellitus without complication (HCC)    Diverticulosis    GERD (gastroesophageal reflux disease)    Hiatal hernia    Hiatal hernia    History of gout    History of kidney stones    Hyperlipemia    Hypertension    Peripheral vascular disease ([place])    Pre-diabetes    Significant Hospital Events: Including procedures, antibiotic start and stop dates in addition to other pertinent events   CT abdomen/pelvis 2/6 > smooth liver contour, status postcholecystectomy.  Minimal central intrahepatic ductal dilatation and mild prominence of the common bile duct Abdominal ultrasound 2/6 > mild hepatic steatosis,, bile duct normal diameter 8 mm  Interim History / Subjective:  Feeling a bit better, still has abdominal pain Pressors being weaned  Objective   Blood pressure (!) 117/58, pulse 85, temperature 98.2 F (36.8 C), temperature source Oral, resp. rate (!) 22, SpO2 93 %.        Intake/Output Summary (Last 24 hours) at 01/16/2023 1456 Last data filed at 01/16/2023 1250 Gross per 24 hour  Intake --   Output 180 ml  Net -180 ml   There were no vitals filed for this visit.  Examination: General: Obese man in no distress, interacting appropriately, comfortable HENT: Oropharynx clear, dry mucous membranes, no oral lesions Lungs: Few bibasilar inspiratory crackles, otherwise clear, no wheezing. Cardiovascular: Irregular, no murmurs.  Sinus rhythm with PVC on telemetry Abdomen: Tender to palpation in the right upper quadrant and right lower quadrant without rebound or guarding.  Left paraumbilical hernia Extremities: 1+ bilateral pitting edema Neuro: Awake, alert, interacting appropriately.  Follows commands, good strength.  No tremor GU: Deferred  Resolved Hospital Problem list     Assessment & Plan:  Septic shock due to suspected cholangitis, possible choledocholithiasis -Appreciate gastroenterology assistance, planning for MRCP 2/7 and possible ERCP 2/8 -Continue gentle volume resuscitation -Wean norepinephrine as able -Broad-spectrum antibiotics with cefepime and metronidazole, tailor to culture data as available -Check UA.  Blood cultures were done at [facility] -N.p.o. for now pending GI evaluation -Follow CMP for improvement in LFT  Acute renal failure with anion gap metabolic acidosis due to renal function and also lactic acidosis. -Follow urine output, BMP -Trend lactic acid for improvement with volume resuscitation and improve perfusion -Renal dose medications -Avoid nephrotoxins  Stress non-STEMI.  Appears to be due to hypoperfusion and septic shock. -Follow troponin trend for peak -Aspirin ordered, no clear indication for heparin at this time -If his shock does not resolve then we will check an echocardiogram -Likely needs cardiology evaluation and ischemic workup once stabilized from these acute events  History of alcohol use.  He has been cutting down over the last month -No evidence of withdrawal, follow and initiate CIWA if he develops symptoms -Thiamine, folate,  vitamin C  History of hypertension. -Hold home lisinopril/HCTZ given in shock state, renal function etc.  Diabetes mellitus with hyperglycemia. -Hold his metformin -Initiate sliding scale insulin and CBG checks  Hyperlipidemia -Hold atorvastatin pending improvement in his liver function  Gout -Hold allopurinol until his AKI improves  Best Practice (right click and "Reselect all SmartList Selections" daily)   Diet/type: NPO w/ oral meds DVT prophylaxis: SCD GI prophylaxis: H2B Lines: N/A Foley:  N/A Code Status:  full code Last date of multidisciplinary goals of care discussion [discussed w patient 2/7]  Labs   CBC: Recent Labs  Lab 01/15/23 1011 01/16/23 0645  WBC 11.3* 13.7*  NEUTROABS  --  12.8*  HGB 14.7 13.7  HCT 43.6 40.9  MCV 104.8* 106.8*  PLT 144* 112*    Basic Metabolic Panel: Recent Labs  Lab 01/15/23 1011 01/16/23 0622  NA 138 136  K 4.0 3.2*  CL 102 102  CO2 25 18*  GLUCOSE 185* 123*  BUN 33* 35*  CREATININE 1.19 2.03*  CALCIUM 9.8 8.7*   GFR: Estimated Creatinine Clearance: 34.5 mL/min (A) (by C-G formula based on SCr of 2.03 mg/dL (H)). Recent Labs  Lab 01/15/23 1011 01/16/23 0645 01/16/23 0734 01/16/23 1029  WBC 11.3* 13.7*  --   --   LATICACIDVEN  --   --  6.1* 5.3*    Liver Function Tests: Recent Labs  Lab 01/15/23 1011 01/16/23 0622  AST 678* 324*  ALT 707* 505*  ALKPHOS 149* 151*  BILITOT 4.4* 5.8*  PROT 7.4 6.2*  ALBUMIN 4.2 3.5   Recent Labs  Lab 01/15/23 1011 01/16/23 0622  LIPASE 30 25   No results for input(s): "AMMONIA" in the last 168 hours.  ABG No results found for: "PHART", "PCO2ART", "PO2ART", "HCO3", "TCO2", "ACIDBASEDEF", "O2SAT"   Coagulation Profile: Recent Labs  Lab 01/16/23 0734  INR 1.5*    Cardiac Enzymes: No results for input(s): "CKTOTAL", "CKMB", "CKMBINDEX", "TROPONINI" in the last 168 hours.  HbA1C: Hgb A1c MFr Bld  Date/Time Value Ref Range Status  03/01/2022 09:33 PM 6.4  (H) 4.8 - 5.6 % Final    Comment:    (NOTE) Pre diabetes:          5.7%-6.4%  Diabetes:              >6.4%  Glycemic control for   <7.0% adults with diabetes     CBG: Recent Labs  Lab 01/16/23 1236  GLUCAP 140*    Review of Systems:   As per HPi  Past Medical History:  He,  has a past medical history of Adenoma of colon, Arthritis, Diabetes mellitus without complication ([place]), Diverticulosis, GERD (gastroesophageal reflux disease), Hiatal hernia, Hiatal hernia, History of gout, History of kidney stones, Hyperlipemia, Hypertension, Peripheral vascular disease ([place]), and Pre-diabetes.   Surgical History:   Past Surgical History:  Procedure Laterality Date   COLONOSCOPY     COLONOSCOPY WITH PROPOFOL N/A 06/18/2016   Procedure: COLONOSCOPY WITH PROPOFOL;  Surgeon: Lollie Sails, MD;  Location: [facility] ENDOSCOPY;  Service: Endoscopy;  Laterality: N/A;   COLONOSCOPY WITH PROPOFOL N/A 01/09/2021   Procedure: COLONOSCOPY WITH PROPOFOL;  Surgeon: Lesly Rubenstein, MD;  Location: [facility] ENDOSCOPY;  Service: Endoscopy;  Laterality: N/A;   ENDARTERECTOMY Right 12/19/2018   Procedure: ENDARTERECTOMY CAROTID;  Surgeon: Katha Cabal,  MD;  Location: [facility] ORS;  Service: Vascular;  Laterality: Right;   ESOPHAGOGASTRODUODENOSCOPY (EGD) WITH PROPOFOL N/A 06/18/2016   Procedure: ESOPHAGOGASTRODUODENOSCOPY (EGD) WITH PROPOFOL;  Surgeon: Lollie Sails, MD;  Location: [facility] ENDOSCOPY;  Service: Endoscopy;  Laterality: N/A;   ESOPHAGOGASTRODUODENOSCOPY (EGD) WITH PROPOFOL N/A 01/09/2021   Procedure: ESOPHAGOGASTRODUODENOSCOPY (EGD) WITH PROPOFOL;  Surgeon: Lesly Rubenstein, MD;  Location: [facility] ENDOSCOPY;  Service: Endoscopy;  Laterality: N/A;   HERNIA REPAIR     umbilical   TONSILLECTOMY       Social History:   reports that he quit smoking about 33 years ago. His smoking use included cigarettes. He has never used smokeless tobacco. He reports current alcohol use of about 6.0 standard  drinks of alcohol per week. He reports that he does not use drugs.   Family History:  His family history includes Cancer in his sister; Cataracts in his mother; Heart attack in his mother.   Allergies No Known Allergies   Home Medications  Prior to Admission medications   Medication Sig Start Date End Date Taking? Authorizing Provider  allopurinol (ZYLOPRIM) 300 MG tablet Take 300 mg by mouth daily.    [provider]  amLODipine (NORVASC) 2.5 MG tablet Take 2.5 mg by mouth daily.    [provider]  aspirin EC 81 MG tablet Take 81 mg by mouth daily.     [provider]  atorvastatin (LIPITOR) 40 MG tablet Take 40 mg by mouth daily. 01/29/22   [provider]  Cholecalciferol (D3-1000) 25 MCG (1000 UT) tablet Take 1,000 Units by mouth daily.    [provider]  diphenhydrAMINE (BENADRYL) 25 mg capsule Take 25 mg by mouth daily.    [provider]  lisinopril-hydrochlorothiazide (PRINZIDE,ZESTORETIC) 20-12.5 MG tablet Take 2 tablets by mouth daily.     [provider]  metFORMIN (GLUCOPHAGE) 500 MG tablet Take 500 mg by mouth 2 (two) times daily. 09/02/21   [provider]  metoprolol tartrate (LOPRESSOR) 25 MG tablet Take 25 mg by mouth 2 (two) times daily.    [provider]  Multiple Vitamin (MULTI-VITAMINS) TABS Take 1 tablet by mouth daily.     [provider]  pantoprazole (PROTONIX) 40 MG tablet Take 40 mg by mouth daily. 09/12/21   [provider]  vitamin B-12 (CYANOCOBALAMIN) 1000 MCG tablet Take 1,000 mcg by mouth daily.    [provider]  vitamin C (ASCORBIC ACID) 500 MG tablet Take 500 mg by mouth daily.    [provider]     Critical care time: 40 min     Baltazar Apo, MD, PhD 01/16/2023, 3:08 PM [facility] [phone] or if no answer before 7:00PM call [phone] For any issues after 7:00PM please call eLink [phone]

## 2023-01-16 NOTE — ED Provider Triage Note (Signed)
Emergency Medicine Provider Triage Evaluation Note  Victor Rojas , a 77 y.o. male  was evaluated in triage.  Pt complains of abdominal pain, nausea.  Left AMA yesterday from the ED.  Found to have choledocholithiasis.  Review of Systems  Positive: Nausea, abdominal pain Negative: Chest pain, vomiting  Physical Exam  BP (!) 70/46   Pulse (!) 50   Temp (!) 97.4 F (36.3 C) (Oral)   Resp 20   Ht '5\' 8"'$  (1.727 m)   Wt 94.3 kg   SpO2 100%   BMI 31.63 kg/m  Gen:   Awake, mild distress   Resp:  Normal effort  MSK:   Moves extremities without difficulty  Other:  Tender to palpation upper abdomen; jaundiced  Medical Decision Making  Medically screening exam initiated at 6:42 AM.  Appropriate orders placed.  Victor Rojas was informed that the remainder of the evaluation will be completed by another provider, this initial triage assessment does not replace that evaluation, and the importance of remaining in the ED until their evaluation is complete.  77 year old male returning for persistent abdominal pain and nausea.  Diagnosed with choledocholithiasis yesterday and offered admission versus transfer as ERCP not available at our facility until Thursday.  Hypotensive.  Will repeat lab work, initiate IV fluid resuscitation.   Paulette Blanch, MD 01/16/23 [phone]

## 2023-01-16 NOTE — ED Provider Notes (Addendum)
[facility] Provider Note    Event Date/Time   First MD Initiated Contact with Patient 01/16/23 0701     (approximate)   History   Abdominal Pain   HPI  Victor Rojas is a 77 y.o. male past medical history significant for hypertension, hyperlipidemia, diabetes, prior cholecystectomy who presents to the emergency department with dizziness.  Patient was evaluated yesterday in the emergency department for abdominal pain and elevation LFTs and T. bili.  CT scan yesterday that was concerning for choledocholithiasis.  Recommended that the patient be transferred to outside hospital for ERCP however ultimately the patient decided to leave AMA.  States that a couple of hours after getting home he started to feel worse.  States that he is having dizziness and lightheadedness and does not feel well.  States that he is willing to stay now.      Physical Exam   Triage Vital Signs: ED Triage Vitals  Enc Vitals Group     BP 01/16/23 0622 (!) 89/57     Pulse Rate 01/16/23 0622 (!) 105     Resp 01/16/23 0622 18     Temp 01/16/23 0622 (!) 97.4 F (36.3 C)     Temp Source 01/16/23 0622 Oral     SpO2 01/16/23 0622 100 %     Weight 01/16/23 0621 208 lb (94.3 kg)     Height 01/16/23 0621 '5\' 8"'$  (1.727 m)     Head Circumference --      Peak Flow --      Pain Score 01/16/23 0620 6     Pain Loc --      Pain Edu? --      Excl. in [place]? --     Most recent vital signs: Vitals:   01/16/23 0800 01/16/23 0830  BP: (!) 74/53 (!) 104/57  Pulse: (!) 54   Resp: 18 19  Temp:    SpO2: 100%     Physical Exam Constitutional:      Appearance: He is well-developed. He is ill-appearing.  HENT:     Head: Atraumatic.  Eyes:     General: Scleral icterus present.     Conjunctiva/sclera: Conjunctivae normal.  Pulmonary:     Effort: No respiratory distress.  Abdominal:     Tenderness: There is abdominal tenderness in the right upper quadrant and epigastric area.   Musculoskeletal:     Cervical back: Normal range of motion.  Skin:    General: Skin is warm.     Capillary Refill: Capillary refill takes 2 to 3 seconds.     Coloration: Skin is jaundiced.  Neurological:     Mental Status: He is alert. Mental status is at baseline.     IMPRESSION / MDM / ASSESSMENT AND PLAN / ED COURSE  I reviewed the triage vital signs and the nursing notes.  On chart review patient was evaluated yesterday and diagnosed with choledocholithiasis.  Returns today for worsening dizziness.  On arrival to the emergency department patient found to be hypotensive.  Initial orders were placed by outgoing physician.  Added on blood cultures and lactic acid.  Initially did not receive 30 cc/kg's of IV fluids, felt it may be detrimental given his shortness of breath, given 1 L and will reevaluate.  Started on antibiotics of cefepime and Flagyl.  Repeat blood pressure continued to be hypotensive.  Given another 2 L of IV fluid to cover 30 cc/kg of IV fluids.  Started on peripheral Levophed given his  persistent hypotension and poor perfusion.   EKG  I, Nathaniel Man, the attending physician, personally viewed and interpreted this ECG.   Rate: 90s  Rhythm: Bigeminy  Axis: Normal  Intervals: Bundle branch block  ST&T Change: Nonspecific Bigeminy when compared to EKG from yesterday  No tachycardic or bradycardic dysrhythmias while on cardiac telemetry.  Radiology reviewed from yesterday -CT scan showed concerning for things of choledocholithiasis.  Ultrasound with cholecystectomy.  LABS (all labs ordered are listed, but only abnormal results are displayed) Labs interpreted as -  Leukocytosis, elevated T. bili and LFTs.  Significantly elevated troponin of 2000 and, significantly elevated from troponin yesterday of 24.  Likely secondary elevation to septic shock.  EKG without findings concerning for STEMI.  Labs Reviewed  COMPREHENSIVE METABOLIC PANEL - Abnormal; Notable  for the following components:      Result Value   Potassium 3.2 (*)    CO2 18 (*)    Glucose, Bld 123 (*)    BUN 35 (*)    Creatinine, Ser 2.03 (*)    Calcium 8.7 (*)    Total Protein 6.2 (*)    AST 324 (*)    ALT 505 (*)    Alkaline Phosphatase 151 (*)    Total Bilirubin 5.8 (*)    GFR, Estimated 33 (*)    Anion gap 16 (*)    All other components within normal limits  CBC WITH DIFFERENTIAL/PLATELET - Abnormal; Notable for the following components:   WBC 13.7 (*)    RBC 3.83 (*)    MCV 106.8 (*)    MCH 35.8 (*)    Platelets 112 (*)    Neutro Abs 12.8 (*)    Lymphs Abs 0.3 (*)    Abs Immature Granulocytes 0.09 (*)    All other components within normal limits  LACTIC ACID, PLASMA - Abnormal; Notable for the following components:   Lactic Acid, Venous 6.1 (*)    All other components within normal limits  PROTIME-INR - Abnormal; Notable for the following components:   Prothrombin Time 18.3 (*)    INR 1.5 (*)    All other components within normal limits  APTT - Abnormal; Notable for the following components:   aPTT 37 (*)    All other components within normal limits  TROPONIN I (HIGH SENSITIVITY) - Abnormal; Notable for the following components:   Troponin I (High Sensitivity) 2,084 (*)    All other components within normal limits  RESP PANEL BY RT-PCR (RSV, FLU A&B, COVID)  RVPGX2  CULTURE, BLOOD (ROUTINE X 2)  CULTURE, BLOOD (ROUTINE X 2)  LIPASE, BLOOD  URINALYSIS, ROUTINE W REFLEX MICROSCOPIC  LACTIC ACID, PLASMA  TROPONIN I (HIGH SENSITIVITY)    TREATMENT  30 cc/kg of IV fluids (3 L LR), IV cefepime, Flagyl, Levophed  MDM    Initially consulted gastroenterology at this hospital for ERCP however unable to do today at this hospital and given the patient's hypotensive feel that it would be best to transfer the patient to another hospital for ERCP.  Discussed with gastroenterology at [facility] with Vicie Mutters, [place], recommended admission to ICU.  Patient was  accepted by Dr. Doyle Askew to the ICU.  8:55 AM Sepsis re-eval, improved perfusion on levophed, no further IVF at this time.    PROCEDURES:  Critical Care performed: Yes  .Critical Care  Performed by: Nathaniel Man, MD Authorized by: Nathaniel Man, MD   Critical care provider statement:    Critical care time (minutes):  45  Critical care time was exclusive of:  Separately billable procedures and treating other patients   Critical care was necessary to treat or prevent imminent or life-threatening deterioration of the following conditions:  Sepsis   Critical care was time spent personally by me on the following activities:  Development of treatment plan with patient or surrogate, discussions with consultants, evaluation of patient's response to treatment, examination of patient, ordering and review of laboratory studies, ordering and review of radiographic studies, ordering and performing treatments and interventions, pulse oximetry, re-evaluation of patient's condition and review of old charts   Patient's presentation is most consistent with acute presentation with potential threat to life or bodily function.   MEDICATIONS ORDERED IN ED: Medications  lactated ringers infusion ( Intravenous New Bag/Given 01/16/23 0817)  metroNIDAZOLE (FLAGYL) IVPB 500 mg (500 mg Intravenous New Bag/Given 01/16/23 0817)  norepinephrine (LEVOPHED) '4mg'$  in 249m (0.016 mg/mL) premix infusion (2 mcg/min Intravenous New Bag/Given 01/16/23 0810)  lactated ringers bolus 1,000 mL (has no administration in time range)  sodium chloride 0.9 % bolus 1,000 mL (0 mLs Intravenous Stopped 01/16/23 0733)  lactated ringers bolus 1,000 mL (1,000 mLs Intravenous New Bag/Given 01/16/23 0731)  ceFEPIme (MAXIPIME) 2 g in sodium chloride 0.9 % 100 mL IVPB (0 g Intravenous Stopped 01/16/23 0836)    FINAL CLINICAL IMPRESSION(S) / ED DIAGNOSES   Final diagnoses:  Cholangitis  Septic shock ([place]     Rx / DC Orders   ED Discharge  Orders     None        Note:  This document was prepared using Dragon voice recognition software and may include unintentional dictation errors.   MNathaniel Man MD 01/16/23 00093   MNathaniel Man MD 01/16/23 [phone]

## 2023-01-16 NOTE — Progress Notes (Signed)
Levophed is not infusing at this time. Informed patient's RN that put in the IV consult if patient need levophed. HS [employer]

## 2023-01-16 NOTE — Sepsis Progress Note (Signed)
Notified bedside nurse of need to draw repeat lactic acid. 

## 2023-01-16 NOTE — Sepsis Progress Note (Signed)
Code Sepsis being monitored by [facility]. Notified paramedic of need to draw and administer antibiotics, lactic acid, and blood cultures. Note that [facility] has been called for transport to [facility].

## 2023-01-16 NOTE — ED Notes (Signed)
Dr. Beather Arbour notified of patient's blood pressure.  Dr. Leonides Schanz also placed order for fluid bolus.  Updated patient that new provider would be taking over at 7am and would continue care.  Pt and family verbalized understanding.

## 2023-01-16 NOTE — ED Notes (Signed)
Iona Beard from [facility] informed me of bed assignment  [facility]  [phone] report,   transport will come as soon as possible 1035

## 2023-01-16 NOTE — Progress Notes (Signed)
Pharmacy Antibiotic Note  Victor Rojas is a 77 y.o. male admitted on 01/16/2023 with  intra-abdominal infection .  Pharmacy has been consulted for cefepime dosing.  Last doses received at [facility] of cefepime/flagyl at ~0800 on 2/7  Plan: Cefepime 2g IV q 12h Flagyl per MD F/u cultures, renal recovery    Temp (24hrs), Avg:97.7 F (36.5 C), Min:97.4 F (36.3 C), Max:98.2 F (36.8 C)  Recent Labs  Lab 01/15/23 1011 01/16/23 0622 01/16/23 0645 01/16/23 0734 01/16/23 1029  WBC 11.3*  --  13.7*  --   --   CREATININE 1.19 2.03*  --   --   --   LATICACIDVEN  --   --   --  6.1* 5.3*    Estimated Creatinine Clearance: 34.5 mL/min (A) (by C-G formula based on SCr of 2.03 mg/dL (H)).    No Known Allergies  Antimicrobials this admission: Cefepime 2/7> Flagyl 2/7>  Dose adjustments this admission:   Microbiology results: 2/7 MRSA: ** 2/7 Bcx: ** 2/7 COVID/flu: neg   Thank you for allowing pharmacy to be a part of this patient's care.  Wilson Singer, PharmD Clinical Pharmacist 01/16/2023 1:35 PM

## 2023-01-16 NOTE — H&P (Cosign Needed Addendum)
NAME:  Victor Rojas, MRN:  [id number], DOB:  06/02/1946, LOS: 0 ADMISSION DATE:  01/16/2023, CONSULTATION DATE:  01/16/2023 REFERRING MD:  Jori Moll [facility] ED), CHIEF COMPLAINT:  Septic shock  History of Present Illness:  Victor Rojas is a 77 y.o. male past medical history significant for hypertension, hyperlipidemia, diabetes, and prior cholecystectomy in 2022 who presented to [facility] ED (2/7) with dizziness.  Was seen day prior (2/6) at [facility] ED for abdominal pain and found to have elevation of LFTs and Tbili.  CT scan was concerning for choledocholithiasis.  Recommended ERCP at [facility], however patient left AMA.  Started to feel worse after returning home with new dizziness and lightheadedness.  Upon presentation to [facility] ED again, found to be hypotensive with troponin newly elevated to 2000 from 24 the day prior and lactate 6.1.  EKG with no ischemic changes.  Given 3 L IV fluid.  Started on cefepime and metronidazole as well as norepinephrine.  Discussed with GI at [facility] and transferred to ICU at [facility] given that urgent ERCP could not be done in setting of sepsis.  Pertinent  Medical History  - HTN - HLD - Gout - Cholecystectomy 12/2020 - GERD - T2DM - Hiatal hernia - Diverticulosis - PAD s/p R endarterectomy 12/2018  Significant Hospital Events: Including procedures, antibiotic start and stop dates in addition to other pertinent events   2/6 - Seen at [facility] with elevated LFTs and Tbili, CT concerning for choledocholithiasis, left AMA 2/7 - Returned to [facility] with sepsis; started on cefepime, metronidazole, and norepinephrine; transferred to [facility] and admitted to ICU  Interim History / Subjective:  Presents alert and awake. Has not eaten in a few days, feels weak.  Has also been hydrating poorly.  Had been nauseous, but has not had any vomiting.  No diarrhea.  No recent illnesses prior to current episode. Endorses some RUQ/RLQ pain, but not severe.  Endorses chronic leg swelling that is unchanged from  baseline.  Denies dyspnea or chest pain.  Initial bladder scan on arrival showing 243 mL retained, but after spontaneous urination decreased to 53 mL.  Alcohol use history: Last drink was about 1 week ago. Used to drink 3-4 shots of whiskey daily, then quit that a few months ago and moved to a daily beer up until 1 week ago.  Lab trends past 24 hours: Troponin: 20 > 2084 > 2235 Lactate: 6.1 > 5.3 AST: 678 > 324 ALT: 707 > 505 Tbili: 4.4 > 5.8 sCr: 1.19 > 2.03  Objective   Blood pressure 106/67, pulse 92, temperature (!) 97.4 F (36.3 C), resp. rate (!) 26, height '5\' 8"'$  (1.727 m), weight 94.3 kg, SpO2 96 %.        Intake/Output Summary (Last 24 hours) at 01/16/2023 1146 Last data filed at 01/16/2023 0959 Gross per 24 hour  Intake 3193.51 ml  Output --  Net 3193.51 ml   Filed Weights   01/16/23 [id number]  Weight: 94.3 kg   Examination: General: Appears stated age, uncomfortable appearing. Alert and oriented x4. Not in acute distress. HEENT: Dry mucous membranes. PERRLA. Moderate dentition. Mild scleral icterus. Lungs: Mild bibasilar crackles, otherwise CTAB. Normal WOB on room air. Cardiovascular: Irregular heartbeat. Normal S1/S2. No murmurs/rubs/gallops. Abdomen: TTP in RUQ and RLQ without rebound or guarding.  Left paraumbilical hernia. No CVA tenderness. GU: No suprapubic tenderness. No visible lesions. No CVA tenderness. Extremities: Capillary refill 2-3 seconds. Extremities perfusing. 1+ pitting edema of legs bilaterally (states it is chronic). Skin: No  rashes grossly. No petechiae. Warm. Neuro: A&Ox4. No asterixis.  New Labs & Imaging  CT abdomen w/ contrast 2/7: There is mild increased density within the lumen of the distal common bile duct that appears new compared to the 03/01/2022 CT prior to the interval cholecystectomy (current CT axial series 3 images 33 through 38) which may represent a cluster of common bile duct stones measuring up to approximately 4 mm in  transverse dimension and 11 mm in craniocaudal length (coronal series 5, image 45).  EKG 2/7: Regular sinus rhythm. Frequent PVCs. Lateral lead T wave inversions.  Assessment & Plan:  Septic shock in setting of choledocholithiasis complicated by cholangitis Patient appears clinically stable now off pressors.  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 - Admit to ICU, attending Dr. Lamonte Sakai - Resume cefepime and metronidazole Tx pending possible ERCP - Hold anticoagulation in setting of elevated INR and hepatic dysfunction - Trend troponin, lactate - GI consulted, will order MRCP and consider ERCP tomorrow - NPO pending procedure - f/u blood cultures and MRSA PCR - Daily CMP  Alcohol use disorder Last drink about 1 week ago, no need for CIWA precautions.  Given elevated MCV, will provide B12. - Thiamine, B12, and vitamin C supplementation  AKI Anion gap metabolic acidosis Lactic acidosis AKI likely in setting of dehydration, now s/p 3 L IVF and will continue IVF hydration. - 150 mL/hr NS IV maintenance fluids - f/u urinalysis - Strict I/Os - Daily CMP  Hypokalemia Some PVCs on EKG and nonspecific T wave abnormalities, but overall not concerning EKG.  S/p 20 mEq KCl by EMS in transit. - f/u repeat BMP in PM, replete as necessary  Stress non-STEMI Troponins likely elevated 2/2 sepsis and cholangitis stress, but unclear what baseline cardiac function is. - Consider cardiology f/u once more clinically stable  Gout Resume home allopurinol as AKI resolves.  HLD Resume atorvastatin as liver function improves.  HTN Hold home lisinopril-HCTZ in setting of hypotension.  T2DM Hold metformin during this admission.   Best Practice (right click and "Reselect all SmartList Selections" daily)   Diet/type: NPO DVT prophylaxis: SCD GI prophylaxis: H2B Lines: N/A Foley:  N/A Code Status:  full code Last date of multidisciplinary goals of care discussion: Daughter  updated 2/7 at bedside  Labs   CBC: Recent Labs  Lab 01/15/23 1011 01/16/23 0645  WBC 11.3* 13.7*  NEUTROABS  --  12.8*  HGB 14.7 13.7  HCT 43.6 40.9  MCV 104.8* 106.8*  PLT 144* 112*    Basic Metabolic Panel: Recent Labs  Lab 01/15/23 1011 01/16/23 0622  NA 138 136  K 4.0 3.2*  CL 102 102  CO2 25 18*  GLUCOSE 185* 123*  BUN 33* 35*  CREATININE 1.19 2.03*  CALCIUM 9.8 8.7*   GFR: Estimated Creatinine Clearance: 34.5 mL/min (A) (by C-G formula based on SCr of 2.03 mg/dL (H)). Recent Labs  Lab 01/15/23 1011 01/16/23 0645 01/16/23 0734 01/16/23 1029  WBC 11.3* 13.7*  --   --   LATICACIDVEN  --   --  6.1* 5.3*    Liver Function Tests: Recent Labs  Lab 01/15/23 1011 01/16/23 0622  AST 678* 324*  ALT 707* 505*  ALKPHOS 149* 151*  BILITOT 4.4* 5.8*  PROT 7.4 6.2*  ALBUMIN 4.2 3.5   Recent Labs  Lab 01/15/23 1011 01/16/23 0622  LIPASE 30 25   No results for input(s): "AMMONIA" in the last 168 hours.  ABG No results found for: "PHART", "PCO2ART", "PO2ART", "HCO3", "  TCO2", "ACIDBASEDEF", "O2SAT"   Coagulation Profile: Recent Labs  Lab 01/16/23 0734  INR 1.5*    Cardiac Enzymes: No results for input(s): "CKTOTAL", "CKMB", "CKMBINDEX", "TROPONINI" in the last 168 hours.  HbA1C: Hgb A1c MFr Bld  Date/Time Value Ref Range Status  03/01/2022 09:33 PM 6.4 (H) 4.8 - 5.6 % Final    Comment:    (NOTE) Pre diabetes:          5.7%-6.4%  Diabetes:              >6.4%  Glycemic control for   <7.0% adults with diabetes     CBG: No results for input(s): "GLUCAP" in the last 168 hours.  Review of Systems:   Per HPI.  Past Medical History:  He,  has a past medical history of Adenoma of colon, Arthritis, Diabetes mellitus without complication ([place]), Diverticulosis, GERD (gastroesophageal reflux disease), Hiatal hernia, Hiatal hernia, History of gout, History of kidney stones, Hyperlipemia, Hypertension, Peripheral vascular disease ([place]), and  Pre-diabetes.   Surgical History:   Past Surgical History:  Procedure Laterality Date   COLONOSCOPY     COLONOSCOPY WITH PROPOFOL N/A 06/18/2016   Procedure: COLONOSCOPY WITH PROPOFOL;  Surgeon: Lollie Sails, MD;  Location: [facility] ENDOSCOPY;  Service: Endoscopy;  Laterality: N/A;   COLONOSCOPY WITH PROPOFOL N/A 01/09/2021   Procedure: COLONOSCOPY WITH PROPOFOL;  Surgeon: Lesly Rubenstein, MD;  Location: [facility] ENDOSCOPY;  Service: Endoscopy;  Laterality: N/A;   ENDARTERECTOMY Right 12/19/2018   Procedure: ENDARTERECTOMY CAROTID;  Surgeon: Katha Cabal, MD;  Location: [facility] ORS;  Service: Vascular;  Laterality: Right;   ESOPHAGOGASTRODUODENOSCOPY (EGD) WITH PROPOFOL N/A 06/18/2016   Procedure: ESOPHAGOGASTRODUODENOSCOPY (EGD) WITH PROPOFOL;  Surgeon: Lollie Sails, MD;  Location: [facility] ENDOSCOPY;  Service: Endoscopy;  Laterality: N/A;   ESOPHAGOGASTRODUODENOSCOPY (EGD) WITH PROPOFOL N/A 01/09/2021   Procedure: ESOPHAGOGASTRODUODENOSCOPY (EGD) WITH PROPOFOL;  Surgeon: Lesly Rubenstein, MD;  Location: [facility] ENDOSCOPY;  Service: Endoscopy;  Laterality: N/A;   HERNIA REPAIR     umbilical   TONSILLECTOMY       Social History:   reports that he quit smoking about 33 years ago. His smoking use included cigarettes. He has never used smokeless tobacco. He reports current alcohol use of about 6.0 standard drinks of alcohol per week. He reports that he does not use drugs.   Family History:  His family history includes Cancer in his sister; Cataracts in his mother; Heart attack in his mother.   Allergies No Known Allergies   Home Medications  Prior to Admission medications   Medication Sig Start Date End Date Taking? Authorizing Provider  allopurinol (ZYLOPRIM) 300 MG tablet Take 300 mg by mouth daily.    [provider]  amLODipine (NORVASC) 2.5 MG tablet Take 2.5 mg by mouth daily.    [provider]  aspirin EC 81 MG tablet Take 81 mg by mouth daily.     [provider]  atorvastatin (LIPITOR) 40 MG tablet Take 40 mg by mouth daily. 01/29/22   [provider]  Cholecalciferol (D3-1000) 25 MCG (1000 UT) tablet Take 1,000 Units by mouth daily.    [provider]  diphenhydrAMINE (BENADRYL) 25 mg capsule Take 25 mg by mouth daily.    [provider]  lisinopril-hydrochlorothiazide (PRINZIDE,ZESTORETIC) 20-12.5 MG tablet Take 2 tablets by mouth daily.     [provider]  metFORMIN (GLUCOPHAGE) 500 MG tablet Take 500 mg by mouth 2 (two) times daily. 09/02/21   [provider]  metoprolol tartrate (LOPRESSOR) 25 MG tablet Take 25 mg by mouth 2 (two) times daily.    [provider]  Multiple Vitamin (MULTI-VITAMINS) TABS Take 1 tablet by mouth daily.     [provider]  pantoprazole (PROTONIX) 40 MG tablet Take 40 mg by mouth daily. 09/12/21   [provider]  vitamin B-12 (CYANOCOBALAMIN) 1000 MCG tablet Take 1,000 mcg by mouth daily.    [provider]  vitamin C (ASCORBIC ACID) 500 MG tablet Take 500 mg by mouth daily.    [provider]    Jessicca Stitzer [place], MS4 01/16/2023, 11:46 AM Johnell Comings, PCCM

## 2023-01-16 NOTE — H&P (Incomplete Revision)
NAME:  Victor Rojas, MRN:  [id number], DOB:  [birth year], LOS: 0 ADMISSION DATE:  01/16/2023, CONSULTATION DATE:  01/16/2023 REFERRING MD:  Jori Moll [facility] ED), CHIEF COMPLAINT:  Septic shock  History of Present Illness:  Victor Rojas is a 77 y.o. male past medical history significant for hypertension, hyperlipidemia, diabetes, and prior cholecystectomy in 2022 who presented to [facility] ED (2/7) with dizziness.  Was seen day prior (2/6) at [facility] ED for abdominal pain and found to have elevation of LFTs and Tbili.  CT scan was concerning for choledocholithiasis.  Recommended ERCP at [facility], however patient left AMA.  Started to feel worse after returning home with new dizziness and lightheadedness.  Upon presentation to [facility] ED again, found to be hypotensive with troponin newly elevated to 2000 from 24 the day prior and lactate 6.1.  EKG with no ischemic changes.  Given 3 L IV fluid.  Started on cefepime and metronidazole as well as norepinephrine.  Discussed with GI at [facility] and transferred to ICU at [facility] given that urgent ERCP could not be done in setting of sepsis.  Pertinent  Medical History  - HTN - HLD - Gout - Cholecystectomy 12/2020 - GERD - T2DM - Hiatal hernia - Diverticulosis - PAD s/p R endarterectomy 12/2018  Significant Hospital Events: Including procedures, antibiotic start and stop dates in addition to other pertinent events   2/6 - Seen at [facility] with elevated LFTs and Tbili, CT concerning for choledocholithiasis, left AMA 2/7 - Returned to [facility] with sepsis; started on cefepime, metronidazole, and norepinephrine; transferred to [facility] and admitted to ICU  Interim History / Subjective:  Presents alert and awake. Has not eaten in a few days, feels weak.  Has also been hydrating poorly.  Had been nauseous, but has not had any vomiting.  No diarrhea.  No recent illnesses prior to current episode. Endorses some RUQ/RLQ pain, but not severe.  Endorses chronic leg swelling that is unchanged from  baseline.  Denies dyspnea or chest pain.  Initial bladder scan on arrival showing 243 mL retained, but after spontaneous urination decreased to 53 mL.  Alcohol use history: Last drink was about 1 week ago. Used to drink 3-4 shots of whiskey daily, then quit that a few months ago and moved to a daily beer up until 1 week ago.  Lab trends past 24 hours: Troponin: 20 > 2084 > 2235 Lactate: 6.1 > 5.3 AST: 678 > 324 ALT: 707 > 505 Tbili: 4.4 > 5.8 sCr: 1.19 > 2.03  Objective   Blood pressure 106/67, pulse 92, temperature (!) 97.4 F (36.3 C), resp. rate (!) 26, height '5\' 8"'$  (1.727 m), weight 94.3 kg, SpO2 96 %.        Intake/Output Summary (Last 24 hours) at 01/16/2023 1146 Last data filed at 01/16/2023 0959 Gross per 24 hour  Intake 3193.51 ml  Output --  Net 3193.51 ml   Filed Weights   01/16/23 [id number]  Weight: 94.3 kg   Examination: General: Appears stated age, uncomfortable appearing. Alert and oriented x4. Not in acute distress. HEENT: Dry mucous membranes. PERRLA. Moderate dentition. Mild scleral icterus. Lungs: Mild bibasilar crackles, otherwise CTAB. Normal WOB on room air. Cardiovascular: Irregular heartbeat. Normal S1/S2. No murmurs/rubs/gallops. Abdomen: TTP in RUQ and RLQ without rebound or guarding.  Left paraumbilical hernia. No CVA tenderness. GU: No suprapubic tenderness. No visible lesions. No CVA tenderness. Extremities: Capillary refill 2-3 seconds. Extremities perfusing. 1+ pitting edema of legs bilaterally (states it is chronic). Skin: No  rashes grossly. No petechiae. Warm. Neuro: A&Ox4. No asterixis.  New Labs & Imaging  CT abdomen w/ contrast 2/7: There is mild increased density within the lumen of the distal common bile duct that appears new compared to the 03/01/2022 CT prior to the interval cholecystectomy (current CT axial series 3 images 33 through 38) which may represent a cluster of common bile duct stones measuring up to approximately 4 mm in  transverse dimension and 11 mm in craniocaudal length (coronal series 5, image 45).  EKG 2/7: Regular sinus rhythm. Frequent PVCs. Lateral lead T wave inversions.  Assessment & Plan:  Septic shock in setting of choledocholithiasis complicated by cholangitis Patient appears clinically stable now off pressors.  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 - Admit to ICU, attending Dr. Lamonte Sakai - Resume cefepime and metronidazole Tx pending possible ERCP - Hold anticoagulation in setting of elevated INR and hepatic dysfunction - Trend troponin, lactate - GI consulted, will order MRCP and consider ERCP tomorrow - NPO pending procedure - f/u blood cultures and MRSA PCR - Daily CMP  Alcohol use disorder Last drink about 1 week ago, no need for CIWA precautions.  Given elevated MCV, will provide B12. - Thiamine, B12, and vitamin C supplementation  AKI Anion gap metabolic acidosis Lactic acidosis AKI likely in setting of dehydration, now s/p 3 L IVF and will continue IVF hydration. - 150 mL/hr NS IV maintenance fluids - f/u urinalysis - Strict I/Os - Daily CMP  Hypokalemia Some PVCs on EKG and nonspecific T wave abnormalities, but overall not concerning EKG.  S/p 20 mEq KCl by EMS in transit. - f/u repeat BMP in PM, replete as necessary  Stress non-STEMI Troponins likely elevated 2/2 sepsis and cholangitis stress, but unclear what baseline cardiac function is. - Consider cardiology f/u once more clinically stable  Gout Resume home allopurinol as AKI resolves.  HLD Resume atorvastatin as liver function improves.  HTN Hold home lisinopril-HCTZ in setting of hypotension.  T2DM Hold metformin during this admission.   Best Practice (right click and "Reselect all SmartList Selections" daily)   Diet/type: NPO DVT prophylaxis: SCD GI prophylaxis: H2B Lines: N/A Foley:  N/A Code Status:  full code Last date of multidisciplinary goals of care discussion: Daughter  updated 2/7 at bedside  Labs   CBC: Recent Labs  Lab 01/15/23 1011 01/16/23 0645  WBC 11.3* 13.7*  NEUTROABS  --  12.8*  HGB 14.7 13.7  HCT 43.6 40.9  MCV 104.8* 106.8*  PLT 144* 112*    Basic Metabolic Panel: Recent Labs  Lab 01/15/23 1011 01/16/23 0622  NA 138 136  K 4.0 3.2*  CL 102 102  CO2 25 18*  GLUCOSE 185* 123*  BUN 33* 35*  CREATININE 1.19 2.03*  CALCIUM 9.8 8.7*   GFR: Estimated Creatinine Clearance: 34.5 mL/min (A) (by C-G formula based on SCr of 2.03 mg/dL (H)). Recent Labs  Lab 01/15/23 1011 01/16/23 0645 01/16/23 0734 01/16/23 1029  WBC 11.3* 13.7*  --   --   LATICACIDVEN  --   --  6.1* 5.3*    Liver Function Tests: Recent Labs  Lab 01/15/23 1011 01/16/23 0622  AST 678* 324*  ALT 707* 505*  ALKPHOS 149* 151*  BILITOT 4.4* 5.8*  PROT 7.4 6.2*  ALBUMIN 4.2 3.5   Recent Labs  Lab 01/15/23 1011 01/16/23 0622  LIPASE 30 25   No results for input(s): "AMMONIA" in the last 168 hours.  ABG No results found for: "PHART", "PCO2ART", "PO2ART", "HCO3", "  TCO2", "ACIDBASEDEF", "O2SAT"   Coagulation Profile: Recent Labs  Lab 01/16/23 0734  INR 1.5*    Cardiac Enzymes: No results for input(s): "CKTOTAL", "CKMB", "CKMBINDEX", "TROPONINI" in the last 168 hours.  HbA1C: Hgb A1c MFr Bld  Date/Time Value Ref Range Status  03/01/2022 09:33 PM 6.4 (H) 4.8 - 5.6 % Final    Comment:    (NOTE) Pre diabetes:          5.7%-6.4%  Diabetes:              >6.4%  Glycemic control for   <7.0% adults with diabetes     CBG: No results for input(s): "GLUCAP" in the last 168 hours.  Review of Systems:   Per HPI.  Past Medical History:  He,  has a past medical history of Adenoma of colon, Arthritis, Diabetes mellitus without complication ([place]), Diverticulosis, GERD (gastroesophageal reflux disease), Hiatal hernia, Hiatal hernia, History of gout, History of kidney stones, Hyperlipemia, Hypertension, Peripheral vascular disease ([place]), and  Pre-diabetes.   Surgical History:   Past Surgical History:  Procedure Laterality Date   COLONOSCOPY     COLONOSCOPY WITH PROPOFOL N/A 06/18/2016   Procedure: COLONOSCOPY WITH PROPOFOL;  Surgeon: Lollie Sails, MD;  Location: [facility] ENDOSCOPY;  Service: Endoscopy;  Laterality: N/A;   COLONOSCOPY WITH PROPOFOL N/A 01/09/2021   Procedure: COLONOSCOPY WITH PROPOFOL;  Surgeon: Lesly Rubenstein, MD;  Location: [facility] ENDOSCOPY;  Service: Endoscopy;  Laterality: N/A;   ENDARTERECTOMY Right 12/19/2018   Procedure: ENDARTERECTOMY CAROTID;  Surgeon: Katha Cabal, MD;  Location: [facility] ORS;  Service: Vascular;  Laterality: Right;   ESOPHAGOGASTRODUODENOSCOPY (EGD) WITH PROPOFOL N/A 06/18/2016   Procedure: ESOPHAGOGASTRODUODENOSCOPY (EGD) WITH PROPOFOL;  Surgeon: Lollie Sails, MD;  Location: [facility] ENDOSCOPY;  Service: Endoscopy;  Laterality: N/A;   ESOPHAGOGASTRODUODENOSCOPY (EGD) WITH PROPOFOL N/A 01/09/2021   Procedure: ESOPHAGOGASTRODUODENOSCOPY (EGD) WITH PROPOFOL;  Surgeon: Lesly Rubenstein, MD;  Location: [facility] ENDOSCOPY;  Service: Endoscopy;  Laterality: N/A;   HERNIA REPAIR     umbilical   TONSILLECTOMY       Social History:   reports that he quit smoking about 33 years ago. His smoking use included cigarettes. He has never used smokeless tobacco. He reports current alcohol use of about 6.0 standard drinks of alcohol per week. He reports that he does not use drugs.   Family History:  His family history includes Cancer in his sister; Cataracts in his mother; Heart attack in his mother.   Allergies No Known Allergies   Home Medications  Prior to Admission medications   Medication Sig Start Date End Date Taking? Authorizing Provider  allopurinol (ZYLOPRIM) 300 MG tablet Take 300 mg by mouth daily.    [provider]  amLODipine (NORVASC) 2.5 MG tablet Take 2.5 mg by mouth daily.    [provider]  aspirin EC 81 MG tablet Take 81 mg by mouth daily.     [provider]  atorvastatin (LIPITOR) 40 MG tablet Take 40 mg by mouth daily. 01/29/22   [provider]  Cholecalciferol (D3-1000) 25 MCG (1000 UT) tablet Take 1,000 Units by mouth daily.    [provider]  diphenhydrAMINE (BENADRYL) 25 mg capsule Take 25 mg by mouth daily.    [provider]  lisinopril-hydrochlorothiazide (PRINZIDE,ZESTORETIC) 20-12.5 MG tablet Take 2 tablets by mouth daily.     [provider]  metFORMIN (GLUCOPHAGE) 500 MG tablet Take 500 mg by mouth 2 (two) times daily. 09/02/21   [provider]  metoprolol tartrate (LOPRESSOR) 25 MG tablet Take 25 mg by mouth 2 (two) times daily.    [provider]  Multiple Vitamin (MULTI-VITAMINS) TABS Take 1 tablet by mouth daily.     [provider]  pantoprazole (PROTONIX) 40 MG tablet Take 40 mg by mouth daily. 09/12/21   [provider]  vitamin B-12 (CYANOCOBALAMIN) 1000 MCG tablet Take 1,000 mcg by mouth daily.    [provider]  vitamin C (ASCORBIC ACID) 500 MG tablet Take 500 mg by mouth daily.    [provider]    Valentine Barney [place], MS4 01/16/2023, 11:46 AM Johnell Comings, PCCM

## 2023-01-16 NOTE — H&P (View-Only) (Signed)
Consultation Note   Referring Provider:  PCCM PCP: Baxter Hire, MD Primary Gastroenterologist: [facility]        Reason for consultation: Possible cholangitis   Hospital Day: 1  Assessment    # 77 yo male transferred from [facility] ED with sepsis / presumed cholangitis. Mild prominence of CBD ( post-cholecystectomy state) and mild increased density within distal common bile duct on CT scan. Need to evaluate for choledocholithiasis.   # Elevated troponin. Level hasn't peaked ( 24>>2084> 2235) . Related to sepsis? History of heart disease?   # AKI. Cr 1.19  >> 2.03   # Chronic thrombocytopenia. Platelets stable at 112.  # History of colon polyps. Followed by [facility]  # See PMH for additional medical problems   Plan  PCCM will be discontinuing pressors soon.  Continue flagyl and maxipime MRCP today Tentatively scheduled for ERCP tomorrow at 1pm.  The benefits and risks of ERCP with possible sphincterotomy, not limited to cardiopulmonary complications of sedation, bleeding, infection, perforation,and pancreatitis were discussed with the patient who agrees to proceed.    HPI   Patient is a   77 yo male with a PMH of  GERD, HTN, PVD, HLD, DM, gout, adenomatous colon polyps, remote cholecystectomy.   See PMH for any additional medical problems.  Mr. Robison presented to [facility] ED yesterday with non-radiating  epigastric pain. The pain started Monday, it wasn't severe but he couldn't get comfortable. Pain felt like indigestion. He took some Tums and Alka-Seltzer which helped but didn't alleviate the pain.  He had no associated N/V. He went to [facility] ED. Sounds like admission was recommended but he wanted to go home.  He returned to ED with worsening pain.   Significant Studies thus far: WBC 13.7, normal hgb, MCV 106, platelets 112 K+ 3.2, BUN 35, Cr 2.03 Lactic acid 6.1 >> 5.3 Troponin 24 > >2,084 >> 2235  Alk phos 151, Tbii  5.8, AST 324 ( down from 678), ALT 505 ( down from 707).   RUQ >> CBC diameter within normal limits after cholecystectomy  CT AP with contrast >>Minimal central intrahepatic biliary ductal dilatation and mild prominence of the common bile duct. There is mild increased density within the lumen of the distal common bile duct which may represent a cluster of common bile duct stones   Previous GI Evaluation     Recent Labs and Imaging CT ABDOMEN PELVIS W CONTRAST  Addendum Date: 01/15/2023   ADDENDUM REPORT: 01/15/2023 15:21 ADDENDUM: There is mild increased density within the lumen of the distal common bile duct that appears new compared to the 03/01/2022 CT prior to the interval cholecystectomy (current CT axial series 3 images 33 through 38) which may represent a cluster of common bile duct stones measuring up to approximately 4 mm in transverse dimension and 11 mm in craniocaudal length (coronal series 5, image 45). Critical Value/emergent results were called by telephone at the time of interpretation on 01/15/2023 at 3:15 pm to provider [facility] , who verbally acknowledged these results. Electronically Signed   By: Yvonne Kendall M.D.   On: 01/15/2023 15:21   Result Date: 01/15/2023 CLINICAL DATA:  Epigastric pain. Started last night. Pain thyroid and center of abdomen  and radiates upwards. Some nausea. EXAM: CT ABDOMEN AND PELVIS WITH CONTRAST TECHNIQUE: Multidetector CT imaging of the abdomen and pelvis was performed using the standard protocol following bolus administration of intravenous contrast. RADIATION DOSE REDUCTION: This exam was performed according to the departmental dose-optimization program which includes automated exposure control, adjustment of the mA and/or kV according to patient size and/or use of iterative reconstruction technique. CONTRAST:  149m OMNIPAQUE IOHEXOL 300 MG/ML  SOLN COMPARISON:  Right upper quadrant abdominal ultrasound 03/01/2022; CT abdomen and pelvis  03/01/2022 FINDINGS: Lower chest: There is mild curvilinear and ground-glass likely subsegmental atelectasis and/or scarring within the bilateral lung bases. Minimal left foraminal right posterior pleural thickening appears unchanged from prior there is no definite pleural effusion. Dense coronary artery calcifications are again noted. No pericardial effusion. Cardiac silhouette is again at the upper limits of normal size to mildly enlarged. Possible minimal circumferential esophageal thickening is unchanged to mildly improved from prior and may be secondary to underdistention versus the sequela of prior esophagitis. Hepatobiliary: Smooth liver contours. Interval cholecystectomy. Minimal central intrahepatic biliary ductal dilatation and mild prominence of the common bile duct, likely normal postsurgical changes following cholecystectomy. No focal liver lesion is seen. Pancreas: Unremarkable. No pancreatic ductal dilatation or surrounding inflammatory changes. Spleen: Unchanged two individual calcifications within the spleen, possibly the sequela of remote granulomatous infection. Adrenals/Urinary Tract: Normal adrenals. There is again mild high-grade atrophy of the left kidney, chronic. There again may be minimally delayed left renal cortical enhancement compared to the right. Vascular calcifications are again seen within the bilateral renal hila. No hydronephrosis. No renal or ureteral stones are seen. The bilateral ureters are normal in caliber. No focal urinary bladder wall thickening. Stomach/Bowel: The terminal ileum is unremarkable. Normal appendix. No dilated loops of bowel are seen to indicate bowel obstruction. No bowel wall thickening. Vascular/Lymphatic: No abdominal aortic aneurysm. High-grade atherosclerotic calcifications within the aorta, bilateral iliac arteries, and origins of the major intra-abdominal aortic branch vessels, similar to prior. No mesenteric, retroperitoneal, or pelvic  lymphadenopathy. Reproductive: The prostate is again mildly enlarged. The seminal vesicles are grossly unremarkable. Other: A fat containing left paraumbilical hernia is again seen with orifice measuring up to 11 mm and ventral herniated fat measuring up to approximately 4.7 cm in craniocaudal dimension and 5.1 cm in transverse dimension. This is similar in size to prior but there is new mild inflammatory stranding within the herniated fat (axial series 3 images 54 through 62). Small fat containing right inguinal hernia is unchanged. No free air or free fluid is seen within the abdomen or pelvis. Musculoskeletal: Moderate multilevel degenerative disc changes of the thoracic spine. Mild levocurvature centered at L3. Mild chronic anterior T11 vertebral body height loss. Large anterior T9-10 and T10-11 bridging osteophytes. IMPRESSION: Compared to 03/01/2022: 1. Interval cholecystectomy. 2. Unchanged size of fat containing left paraumbilical hernia. There is new mild inflammatory stranding within the herniated fat. This may represent a source of pain. Recommend clinical correlation. 3. Unchanged chronic left renal atrophy. 4. Unchanged severe atherosclerotic calcifications within the aorta, bilateral iliac arteries, and origins of the major intra-abdominal aortic branch vessels. Electronically Signed: By: RYvonne KendallM.D. On: 01/15/2023 12:57   [place] LIMITED RUQ (LIVER/GB)  Result Date: 01/15/2023 CLINICAL DATA:  Elevated liver function tests EXAM: ULTRASOUND ABDOMEN LIMITED RIGHT UPPER QUADRANT COMPARISON:  CT of earlier today. FINDINGS: Gallbladder: Surgically absent Common bile duct: Diameter: Within normal limits after cholecystectomy, 8 mm. Liver: Mildly heterogeneously increased hepatic echogenicity. Portal vein is  patent on color Doppler imaging with normal direction of blood flow towards the liver. Other: None. IMPRESSION: Suspect mild hepatic steatosis. No other explanation for elevated liver  function tests. Electronically Signed   By: Abigail Miyamoto M.D.   On: 01/15/2023 15:05    Labs:  Recent Labs    01/15/23 1011 01/16/23 0645  WBC 11.3* 13.7*  HGB 14.7 13.7  HCT 43.6 40.9  PLT 144* 112*   Recent Labs    01/15/23 1011 01/16/23 0622  NA 138 136  K 4.0 3.2*  CL 102 102  CO2 25 18*  GLUCOSE 185* 123*  BUN 33* 35*  CREATININE 1.19 2.03*  CALCIUM 9.8 8.7*   Recent Labs    01/16/23 0622  PROT 6.2*  ALBUMIN 3.5  AST 324*  ALT 505*  ALKPHOS 151*  BILITOT 5.8*   No results for input(s): "HEPBSAG", "HCVAB", "HEPAIGM", "HEPBIGM" in the last 72 hours. Recent Labs    01/16/23 0734  LABPROT 18.3*  INR 1.5*    Past Medical History:  Diagnosis Date   Adenoma of colon    Arthritis    Diabetes mellitus without complication (HCC)    Diverticulosis    GERD (gastroesophageal reflux disease)    Hiatal hernia    Hiatal hernia    History of gout    History of kidney stones    Hyperlipemia    Hypertension    Peripheral vascular disease ([place])    Pre-diabetes     Past Surgical History:  Procedure Laterality Date   COLONOSCOPY     COLONOSCOPY WITH PROPOFOL N/A 06/18/2016   Procedure: COLONOSCOPY WITH PROPOFOL;  Surgeon: Lollie Sails, MD;  Location: [facility] ENDOSCOPY;  Service: Endoscopy;  Laterality: N/A;   COLONOSCOPY WITH PROPOFOL N/A 01/09/2021   Procedure: COLONOSCOPY WITH PROPOFOL;  Surgeon: Lesly Rubenstein, MD;  Location: [facility] ENDOSCOPY;  Service: Endoscopy;  Laterality: N/A;   ENDARTERECTOMY Right 12/19/2018   Procedure: ENDARTERECTOMY CAROTID;  Surgeon: Katha Cabal, MD;  Location: [facility] ORS;  Service: Vascular;  Laterality: Right;   ESOPHAGOGASTRODUODENOSCOPY (EGD) WITH PROPOFOL N/A 06/18/2016   Procedure: ESOPHAGOGASTRODUODENOSCOPY (EGD) WITH PROPOFOL;  Surgeon: Lollie Sails, MD;  [facility] END[facility]COPY;  Service: Endoscopy;  Laterality: N/A;   ESOPHAGOGASTRODUODENOSCOPY (EGD) WITH PROPOFOL N/A 01/09/2021   Procedure:  ESOPHAGOGASTRODUODENOSCOPY (EGD) WITH PROPOFOL;  Surgeon: Lesly Rubenstein, MD;  Location: [facility] ENDOSCOPY;  Service: Endoscopy;  Laterality: N/A;   HERNIA REPAIR     umbilical   TONSILLECTOMY      Family History  Problem Relation Age of Onset   Cataracts Mother    Heart attack Mother    Cancer Sister     Prior to Admission medications   Medication Sig Start Date End Date Taking? Authorizing Provider  allopurinol (ZYLOPRIM) 300 MG tablet Take 300 mg by mouth daily.    [provider]  amLODipine (NORVASC) 2.5 MG tablet Take 2.5 mg by mouth daily.    [provider]  aspirin EC 81 MG tablet Take 81 mg by mouth daily.     [provider]  atorvastatin (LIPITOR) 40 MG tablet Take 40 mg by mouth daily. 01/29/22   [provider]  Cholecalciferol (D3-1000) 25 MCG (1000 UT) tablet Take 1,000 Units by mouth daily.    [provider]  diphenhydrAMINE (BENADRYL) 25 mg capsule Take 25 mg by mouth daily.    [provider]  lisinopril-hydrochlorothiazide (PRINZIDE,ZESTORETIC) 20-12.5 MG tablet Take 2 tablets by mouth daily.  [provider]  metFORMIN (GLUCOPHAGE) 500 MG tablet Take 500 mg by mouth 2 (two) times daily. 09/02/21   [provider]  metoprolol tartrate (LOPRESSOR) 25 MG tablet Take 25 mg by mouth 2 (two) times daily.    [provider]  Multiple Vitamin (MULTI-VITAMINS) TABS Take 1 tablet by mouth daily.     [provider]  pantoprazole (PROTONIX) 40 MG tablet Take 40 mg by mouth daily. 09/12/21   [provider]  vitamin B-12 (CYANOCOBALAMIN) 1000 MCG tablet Take 1,000 mcg by mouth daily.    [provider]  vitamin C (ASCORBIC ACID) 500 MG tablet Take 500 mg by mouth daily.    [provider]    Current Facility-Administered Medications  Medication Dose Route Frequency Provider Last Rate Last Admin   0.9 %  sodium chloride infusion  250 mL Intravenous Continuous  Byrum, Rose Fillers, MD       [START ON 01/17/2023] allopurinol (ZYLOPRIM) tablet 300 mg  300 mg Oral Daily Collene Gobble, MD       [START ON 01/17/2023] ascorbic acid (VITAMIN C) tablet 500 mg  500 mg Oral Daily Byrum, Rose Fillers, MD       aspirin EC tablet 81 mg  81 mg Oral Daily Byrum, Rose Fillers, MD       atorvastatin (LIPITOR) tablet 40 mg  40 mg Oral Daily Byrum, Rose Fillers, MD       ceFEPIme (MAXIPIME) 2 g in sodium chloride 0.9 % 100 mL IVPB  2 g Intravenous Q12H Wilson Singer I, RPH       [START ON 01/17/2023] cholecalciferol (VITAMIN D3) 25 MCG (1000 UNIT) tablet 1,000 Units  1,000 Units Oral Daily Collene Gobble, MD       [START ON 01/17/2023] cyanocobalamin (VITAMIN B12) tablet 1,000 mcg  1,000 mcg Oral Daily Collene Gobble, MD       metroNIDAZOLE (FLAGYL) IVPB 500 mg  500 mg Intravenous Q12H Collene Gobble, MD       Derrill Memo ON 01/17/2023] multivitamin with minerals tablet 1 tablet  1 tablet Oral Daily Byrum, Rose Fillers, MD       norepinephrine (LEVOPHED) '4mg'$  in 239m (0.016 mg/mL) premix infusion  2-10 mcg/min Intravenous Titrated BCollene Gobble MD        Allergies as of 01/16/2023   (No Known Allergies)    Social History   Socioeconomic History   Marital status: Married    Spouse name: Not on file   Number of children: Not on file   Years of education: Not on file   Highest education level: Not on file  Occupational History   Not on file  Tobacco Use   Smoking status: Former    Types: Cigarettes    Quit date: 02/03/1989    Years since quitting: 33.9   Smokeless tobacco: Never  Vaping Use   Vaping Use: Never used  Substance and Sexual Activity   Alcohol use: Yes    Alcohol/week: 6.0 standard drinks of alcohol    Types: 6 Cans of beer per week   Drug use: No   Sexual activity: Not on file  Other Topics Concern   Not on file  Social History Narrative   Not on file   Social Determinants of Health   Financial Resource Strain: Not on file  Food Insecurity: Not on file   Transportation Needs: Not on file  Physical Activity: Not on file  Stress: Not on file  Social Connections:  Not on file  Intimate Partner Violence: Not on file    Review of Systems: All systems reviewed and negative except where noted in HPI.  Physical Exam: Vital signs in last 24 hours: Temp:  [97.4 F (36.3 C)-98.2 F (36.8 C)] 98.2 F (36.8 C) (02/07 1241) Pulse Rate:  [50-105] 90 (02/07 1330) Resp:  [16-26] 20 (02/07 1330) BP: (70-146)/(46-84) 117/61 (02/07 1330) SpO2:  [91 %-100 %] 96 % (02/07 1330) Weight:  [94.3 kg] 94.3 kg (02/07 7622)    General:  Alert male in NAD Psych:  Pleasant, cooperative. Normal mood and affect Eyes: Pupils equal Ears:  Normal auditory acuity Nose: No deformity, discharge or lesions Neck:  Supple, no masses felt Lungs:  Clear to auscultation.  Heart:  Regular rate, regular rhythm.  Abdomen:  Soft, nondistended, nontender, active bowel sounds, no masses felt Rectal :  Deferred Msk: Symmetrical without gross deformities.  Neurologic:  Alert, oriented, grossly normal neurologically Extremities : No edema Skin:  Intact without significant lesions.    Intake/Output from previous day: No intake/output data recorded. Intake/Output this shift:  No intake/output data recorded.    Active Problems:   * No active hospital problems. Tye Savoy, NP-C @  01/16/2023, 1:39 PM   Attending physician's note   I have taken history, reviewed the chart and examined the patient. I performed a substantive portion of this encounter, including complete performance of at least one of the key components, in conjunction with the APP. I agree with the Advanced Practitioner's note, impression and recommendations.   Suspected cholangitis with shock.  Responding well to cefepime/Flagyl/IVF. H/O lap chole 2022 Elevated troponin- d/t sepsis or cardiac etiology.  Plan: -MRCP -ERCP in AM.  No spots available today.  Besides, he does need  stabilization given elevated troponin/hypotension -Trend CBC, CMP -Follow blood cultures.  I have explained risks and benefits of ERCP in detail to the patient's family including risks of bleeding, perforation, pancreatitis.  The benefits were also discussed.  They wish to proceed.  Carmell Austria, MD [facility] [phone]

## 2023-01-16 NOTE — ED Notes (Signed)
Called [facility] for GI transfer spoke to [employer]

## 2023-01-16 NOTE — ED Notes (Signed)
Iona Beard from [facility] called patient accepted to [facility] ICU [phone]

## 2023-01-16 NOTE — Progress Notes (Signed)
CODE SEPSIS - PHARMACY COMMUNICATION  **Broad Spectrum Antibiotics should be administered within 1 hour of Sepsis diagnosis**  Time Code Sepsis Called/Page Received: 0707  Antibiotics Ordered: cefepime IV 2 grams x 1, metronidazole IV 500 mg x 1,   Time of 1st antibiotic administration: 0758  Additional action taken by pharmacy: none  If necessary, Name of Provider/Nurse Contacted: n/a    Glean Salvo, PharmD, BCPS Clinical Pharmacist  01/16/2023 7:57 AM

## 2023-01-16 NOTE — ED Triage Notes (Signed)
Pt arrived via POV from home, reports worsened abd pain from when he was seen yesterday, pt reports he was going to be admitted, but said he had too much to do and went home. Pt states overnight the pain worsened.   Pt denies any vomiting. Pt c/o chills and sweats, also states he is feeling weak in his legs.

## 2023-01-16 NOTE — Progress Notes (Signed)
PHARMACY -  BRIEF ANTIBIOTIC NOTE   Pharmacy has received consult(s) for cefepime from an ED provider.  The patient's profile has been reviewed for ht/wt/allergies/indication/available labs.    One time order(s) placed for cefepime 2 grams x 1  Further antibiotics/pharmacy consults should be ordered by admitting physician if indicated.                       Thank you,  Glean Salvo, PharmD, BCPS Clinical Pharmacist  01/16/2023 7:58 AM

## 2023-01-17 ENCOUNTER — Encounter (HOSPITAL_COMMUNITY)
Admission: AD | Disposition: A | Discharge status: Home / Self Care | Payer: Self-pay | Source: Ambulatory Visit | Attending: Emergency Medicine

## 2023-01-17 ENCOUNTER — Inpatient Hospital Stay (HOSPITAL_COMMUNITY): Payer: Medicare Other | Admitting: Anesthesiology

## 2023-01-17 ENCOUNTER — Inpatient Hospital Stay (HOSPITAL_COMMUNITY): Payer: Medicare Other

## 2023-01-17 ENCOUNTER — Encounter (HOSPITAL_COMMUNITY): Payer: Self-pay | Admitting: Emergency Medicine

## 2023-01-17 DIAGNOSIS — R6521 Severe sepsis with septic shock: Secondary | ICD-10-CM | POA: Diagnosis not present

## 2023-01-17 DIAGNOSIS — R7989 Other specified abnormal findings of blood chemistry: Secondary | ICD-10-CM | POA: Diagnosis not present

## 2023-01-17 DIAGNOSIS — I10 Essential (primary) hypertension: Secondary | ICD-10-CM

## 2023-01-17 DIAGNOSIS — K8050 Calculus of bile duct without cholangitis or cholecystitis without obstruction: Secondary | ICD-10-CM

## 2023-01-17 DIAGNOSIS — I2510 Atherosclerotic heart disease of native coronary artery without angina pectoris: Secondary | ICD-10-CM | POA: Diagnosis not present

## 2023-01-17 DIAGNOSIS — K8021 Calculus of gallbladder without cholecystitis with obstruction: Secondary | ICD-10-CM | POA: Diagnosis not present

## 2023-01-17 DIAGNOSIS — Z87891 Personal history of nicotine dependence: Secondary | ICD-10-CM

## 2023-01-17 DIAGNOSIS — E119 Type 2 diabetes mellitus without complications: Secondary | ICD-10-CM | POA: Diagnosis not present

## 2023-01-17 DIAGNOSIS — A419 Sepsis, unspecified organism: Secondary | ICD-10-CM | POA: Diagnosis not present

## 2023-01-17 HISTORY — PX: SPHINCTEROTOMY: SHX5544

## 2023-01-17 HISTORY — PX: BILIARY DILATION: SHX6850

## 2023-01-17 HISTORY — PX: REMOVAL OF STONES: SHX5545

## 2023-01-17 HISTORY — PX: ENDOSCOPIC RETROGRADE CHOLANGIOPANCREATOGRAPHY (ERCP) WITH PROPOFOL: SHX5810

## 2023-01-17 LAB — CBC
HCT: 33.2 % — ABNORMAL LOW (ref 39.0–52.0)
Hemoglobin: 11.1 g/dL — ABNORMAL LOW (ref 13.0–17.0)
MCH: 35.7 pg — ABNORMAL HIGH (ref 26.0–34.0)
MCHC: 33.4 g/dL (ref 30.0–36.0)
MCV: 106.8 fL — ABNORMAL HIGH (ref 80.0–100.0)
Platelets: 78 10*3/uL — ABNORMAL LOW (ref 150–400)
RBC: 3.11 MIL/uL — ABNORMAL LOW (ref 4.22–5.81)
RDW: 14.4 % (ref 11.5–15.5)
WBC: 10.8 10*3/uL — ABNORMAL HIGH (ref 4.0–10.5)
nRBC: 0 % (ref 0.0–0.2)

## 2023-01-17 LAB — BASIC METABOLIC PANEL
Anion gap: 13 (ref 5–15)
BUN: 41 mg/dL — ABNORMAL HIGH (ref 8–23)
CO2: 22 mmol/L (ref 22–32)
Calcium: 8.2 mg/dL — ABNORMAL LOW (ref 8.9–10.3)
Chloride: 100 mmol/L (ref 98–111)
Creatinine, Ser: 1.77 mg/dL — ABNORMAL HIGH (ref 0.61–1.24)
GFR, Estimated: 39 mL/min — ABNORMAL LOW (ref >60)
Glucose, Bld: 111 mg/dL — ABNORMAL HIGH (ref 70–99)
Potassium: 4.2 mmol/L (ref 3.5–5.1)
Sodium: 135 mmol/L (ref 135–145)

## 2023-01-17 LAB — BLOOD CULTURE ID PANEL (REFLEXED) - BCID2

## 2023-01-17 LAB — COMPREHENSIVE METABOLIC PANEL
ALT: 833 U/L — ABNORMAL HIGH (ref 0–44)
AST: 807 U/L — ABNORMAL HIGH (ref 15–41)
Albumin: 2.7 g/dL — ABNORMAL LOW (ref 3.5–5.0)
Alkaline Phosphatase: 104 U/L (ref 38–126)
Anion gap: 11 (ref 5–15)
BUN: 36 mg/dL — ABNORMAL HIGH (ref 8–23)
CO2: 22 mmol/L (ref 22–32)
Calcium: 8 mg/dL — ABNORMAL LOW (ref 8.9–10.3)
Chloride: 102 mmol/L (ref 98–111)
Creatinine, Ser: 1.64 mg/dL — ABNORMAL HIGH (ref 0.61–1.24)
GFR, Estimated: 43 mL/min — ABNORMAL LOW (ref >60)
Glucose, Bld: 98 mg/dL (ref 70–99)
Potassium: 3.9 mmol/L (ref 3.5–5.1)
Sodium: 135 mmol/L (ref 135–145)
Total Bilirubin: 5.1 mg/dL — ABNORMAL HIGH (ref 0.3–1.2)
Total Protein: 5.2 g/dL — ABNORMAL LOW (ref 6.5–8.1)

## 2023-01-17 LAB — ECHOCARDIOGRAM COMPLETE
AR max vel: 3.24 cm2
AV Area VTI: 3.61 cm2
AV Area mean vel: 3.45 cm2
AV Mean grad: 3 mmHg
AV Peak grad: 6.7 mmHg
Ao pk vel: 1.29 m/s
Area-P 1/2: 4.31 cm2
Height: 68 in
S' Lateral: 3.6 cm
Weight: 3467.39 oz

## 2023-01-17 LAB — PHOSPHORUS: Phosphorus: 3.4 mg/dL (ref 2.5–4.6)

## 2023-01-17 LAB — GLUCOSE, CAPILLARY
Glucose-Capillary: 105 mg/dL — ABNORMAL HIGH (ref 70–99)
Glucose-Capillary: 110 mg/dL — ABNORMAL HIGH (ref 70–99)
Glucose-Capillary: 98 mg/dL (ref 70–99)
Glucose-Capillary: 125 mg/dL — ABNORMAL HIGH (ref 70–99)
Glucose-Capillary: 204 mg/dL — ABNORMAL HIGH (ref 70–99)

## 2023-01-17 LAB — PROTIME-INR
INR: 1.7 — ABNORMAL HIGH (ref 0.8–1.2)
Prothrombin Time: 20.1 seconds — ABNORMAL HIGH (ref 11.4–15.2)

## 2023-01-17 LAB — DIC (DISSEMINATED INTRAVASCULAR COAGULATION)PANEL
D-Dimer, Quant: 16.81 ug/mL-FEU — ABNORMAL HIGH (ref 0.00–0.50)
Fibrinogen: 765 mg/dL — ABNORMAL HIGH (ref 210–475)
INR: 1.6 — ABNORMAL HIGH (ref 0.8–1.2)
Platelets: 78 10*3/uL — ABNORMAL LOW (ref 150–400)
Prothrombin Time: 18.6 seconds — ABNORMAL HIGH (ref 11.4–15.2)
Smear Review: NONE SEEN
aPTT: 40 seconds — ABNORMAL HIGH (ref 24–36)

## 2023-01-17 LAB — HEMOGLOBIN A1C
Hgb A1c MFr Bld: 6.6 % — ABNORMAL HIGH (ref 4.8–5.6)
Mean Plasma Glucose: 142.72 mg/dL

## 2023-01-17 LAB — TROPONIN I (HIGH SENSITIVITY): Troponin I (High Sensitivity): 3106 ng/L (ref <18)

## 2023-01-17 LAB — BRAIN NATRIURETIC PEPTIDE: B Natriuretic Peptide: 667.2 pg/mL — ABNORMAL HIGH (ref 0.0–100.0)

## 2023-01-17 LAB — MAGNESIUM
Magnesium: 1.6 mg/dL — ABNORMAL LOW (ref 1.7–2.4)
Magnesium: 2.7 mg/dL — ABNORMAL HIGH (ref 1.7–2.4)

## 2023-01-17 LAB — LIPASE, BLOOD
Lipase: 20 U/L (ref 11–51)
Lipase: 22 U/L (ref 11–51)

## 2023-01-17 LAB — PROCALCITONIN: Procalcitonin: 25.3 ng/mL

## 2023-01-17 SURGERY — ENDOSCOPIC RETROGRADE CHOLANGIOPANCREATOGRAPHY (ERCP) WITH PROPOFOL
Anesthesia: General

## 2023-01-17 MED ORDER — FENTANYL CITRATE (PF) 250 MCG/5ML IJ SOLN
INTRAMUSCULAR | Status: DC | PRN
  Administered 2023-01-17: 100 ug via INTRAVENOUS

## 2023-01-17 MED ORDER — DICLOFENAC SUPPOSITORY 100 MG
RECTAL | Status: AC
  Filled 2023-01-17: qty 1

## 2023-01-17 MED ORDER — PHENYLEPHRINE HCL-NACL 20-0.9 MG/250ML-% IV SOLN
INTRAVENOUS | Status: DC | PRN
  Administered 2023-01-17: 25 ug/min via INTRAVENOUS

## 2023-01-17 MED ORDER — GLUCAGON HCL RDNA (DIAGNOSTIC) 1 MG IJ SOLR
INTRAMUSCULAR | Status: DC | PRN
  Administered 2023-01-17: .25 mg via INTRAVENOUS

## 2023-01-17 MED ORDER — LIDOCAINE 2% (20 MG/ML) 5 ML SYRINGE
INTRAMUSCULAR | Status: DC | PRN
  Administered 2023-01-17: 60 mg via INTRAVENOUS

## 2023-01-17 MED ORDER — POTASSIUM CHLORIDE 10 MEQ/100ML IV SOLN
10.0000 meq | Freq: Once | INTRAVENOUS | Status: AC
  Administered 2023-01-17: 10 meq via INTRAVENOUS
  Filled 2023-01-17: qty 100

## 2023-01-17 MED ORDER — MAGNESIUM SULFATE 4 GM/100ML IV SOLN
4.0000 g | Freq: Once | INTRAVENOUS | Status: AC
  Administered 2023-01-17: 4 g via INTRAVENOUS
  Filled 2023-01-17: qty 100

## 2023-01-17 MED ORDER — INDOMETHACIN 50 MG RE SUPP
RECTAL | Status: AC
  Filled 2023-01-17: qty 2

## 2023-01-17 MED ORDER — GLUCAGON HCL RDNA (DIAGNOSTIC) 1 MG IJ SOLR
INTRAMUSCULAR | Status: AC
  Filled 2023-01-17: qty 1

## 2023-01-17 MED ORDER — ORAL CARE MOUTH RINSE: 15.0000 mL | OROMUCOSAL | Status: DC | PRN

## 2023-01-17 MED ORDER — ONDANSETRON HCL 4 MG/2ML IJ SOLN
INTRAMUSCULAR | Status: DC | PRN
  Administered 2023-01-17: 4 mg via INTRAVENOUS

## 2023-01-17 MED ORDER — CHLORHEXIDINE GLUCONATE CLOTH 2 % EX PADS
6.0000 | MEDICATED_PAD | Freq: Every day | CUTANEOUS | Status: DC
  Administered 2023-01-17 – 2023-01-18 (×2): 6 via TOPICAL

## 2023-01-17 MED ORDER — PROPOFOL 10 MG/ML IV BOLUS
INTRAVENOUS | Status: DC | PRN
  Administered 2023-01-17: 100 mg via INTRAVENOUS

## 2023-01-17 MED ORDER — ROCURONIUM BROMIDE 10 MG/ML (PF) SYRINGE
PREFILLED_SYRINGE | INTRAVENOUS | Status: DC | PRN
  Administered 2023-01-17: 50 mg via INTRAVENOUS

## 2023-01-17 MED ORDER — HEPARIN (PORCINE) 25000 UT/250ML-% IV SOLN
1100.0000 [IU]/h | INTRAVENOUS | Status: DC
  Administered 2023-01-17: 1100 [IU]/h via INTRAVENOUS
  Filled 2023-01-17: qty 250

## 2023-01-17 MED ORDER — BLISTEX MEDICATED EX OINT: TOPICAL_OINTMENT | CUTANEOUS | Status: DC | PRN

## 2023-01-17 MED ORDER — FENTANYL CITRATE (PF) 100 MCG/2ML IJ SOLN
INTRAMUSCULAR | Status: AC
  Filled 2023-01-17: qty 2

## 2023-01-17 MED ORDER — PHENYLEPHRINE 80 MCG/ML (10ML) SYRINGE FOR IV PUSH (FOR BLOOD PRESSURE SUPPORT)
PREFILLED_SYRINGE | INTRAVENOUS | Status: DC | PRN
  Administered 2023-01-17 (×4): 200 ug via INTRAVENOUS

## 2023-01-17 MED ORDER — SODIUM CHLORIDE 0.9 % IV SOLN
INTRAVENOUS | Status: DC | PRN
  Administered 2023-01-17: 20 mL

## 2023-01-17 MED ORDER — SUGAMMADEX SODIUM 200 MG/2ML IV SOLN
INTRAVENOUS | Status: DC | PRN
  Administered 2023-01-17: 200 mg via INTRAVENOUS

## 2023-01-17 MED ORDER — DICLOFENAC SUPPOSITORY 100 MG
RECTAL | Status: DC | PRN
  Administered 2023-01-17: 100 mg via RECTAL

## 2023-01-17 MED ORDER — DEXAMETHASONE SODIUM PHOSPHATE 10 MG/ML IJ SOLN
INTRAMUSCULAR | Status: DC | PRN
  Administered 2023-01-17: 10 mg via INTRAVENOUS

## 2023-01-17 MED ORDER — FAMOTIDINE 20 MG PO TABS
20.0000 mg | ORAL_TABLET | Freq: Every day | ORAL | Status: DC
  Administered 2023-01-17: 20 mg via ORAL
  Filled 2023-01-17: qty 1

## 2023-01-17 NOTE — Transfer of Care (Signed)
Immediate Anesthesia Transfer of Care Note  Patient: Victor Rojas  Procedure(s) Performed: ENDOSCOPIC RETROGRADE CHOLANGIOPANCREATOGRAPHY (ERCP) WITH PROPOFOL SPHINCTEROTOMY BILIARY DILATION REMOVAL OF STONES  Patient Location: Endoscopy Unit  Anesthesia Type:General  Level of Consciousness: drowsy and patient cooperative  Airway & Oxygen Therapy: Patient Spontanous Breathing and Patient connected to nasal cannula oxygen  Post-op Assessment: Report given to RN and Post -op Vital signs reviewed and stable  Post vital signs: Reviewed and stable  Last Vitals:  Vitals Value Taken Time  BP 101/77 01/17/23 1443  Temp 36.5 C 01/17/23 1443  Pulse 92 01/17/23 1448  Resp 17 01/17/23 1448  SpO2 92 % 01/17/23 1448  Vitals shown include unvalidated device data.  Last Pain:  Vitals:   01/17/23 1443  TempSrc: Temporal  PainSc:          Complications: No notable events documented.

## 2023-01-17 NOTE — Progress Notes (Signed)
NAME:  Victor Rojas, MRN:  [id number], DOB:  October 13, 1946, LOS: 1 ADMISSION DATE:  01/16/2023,  CHIEF COMPLAINT: Choledocholithiasis, septic shock  History of Present Illness:  77 year old male with a history of diabetes, hypertension, hyperlipidemia, alcohol use.  He had a cholecystectomy in 2022.  Evaluated at [facility] for abdominal pain 2/6 where he had a significant transaminitis and a CT abdomen that was concerning for choledocholithiasis.  He left AMA but then returned on 2/7 with lightheadedness, dizziness feeling worse.  His evaluation was significant for evolving shock with an elevated lactic acid, elevated troponin.  He was treated with aggressive IV fluids, empiric broad-spectrum antibiotics for presumed cholangitis and septic shock.  He transfers now to [facility] for further care.  Pertinent  Medical History   Past Medical History:  Diagnosis Date   Adenoma of colon    Arthritis    Diabetes mellitus without complication (HCC)    Diverticulosis    GERD (gastroesophageal reflux disease)    Hiatal hernia    Hiatal hernia    History of gout    History of kidney stones    Hyperlipemia    Hypertension    Peripheral vascular disease ([place])    Pre-diabetes    Significant Hospital Events: Including procedures, antibiotic start and stop dates in addition to other pertinent events   CT abdomen/pelvis 2/6 > smooth liver contour, status postcholecystectomy.  Minimal central intrahepatic ductal dilatation and mild prominence of the common bile duct Abdominal ultrasound 2/6 > mild hepatic steatosis,, bile duct normal diameter 8 mm MRCP 2/7 > periportal edema with peribiliary enhancement and increased biliary ductal dilatation, common duct 11 mm with few T2 hypointense foci in the distal duct, consistent with choledocholithiasis and ascending cholangitis.  Increased T2 signal in the pancreatic head, question involving mild pancreatitis Blood culture 2/7 >> GNR, GPR >> (BC ID Enterobacter) >    Interim History / Subjective:   Chest discomfort last night, improved Steady rise troponin > 4000 Pressors off MRCP as above Started on 2L/min by [place]  Objective   Blood pressure (!) 109/52, pulse 77, temperature 97.6 F (36.4 C), temperature source Oral, resp. rate 16, weight 98.3 kg, SpO2 95 %.        Intake/Output Summary (Last 24 hours) at 01/17/2023 8676 Last data filed at 01/17/2023 0600 Gross per 24 hour  Intake 1367.26 ml  Output 805 ml  Net 562.26 ml   Filed Weights   01/17/23 0500  Weight: 98.3 kg    Examination: General: Obese man in no distress, interacting appropriately, comfortable HENT: Oropharynx clear, dry mucous membranes, no oral lesions Lungs: Few bibasilar inspiratory crackles, otherwise clear, no wheezing. Cardiovascular: Irregular, no murmurs.  Sinus rhythm with PVC on telemetry Abdomen: Tender to palpation in the right upper quadrant and right lower quadrant without rebound or guarding.  Left paraumbilical hernia Extremities: 1+ bilateral pitting edema Neuro: Awake, alert, interacting appropriately.  Follows commands, good strength.  No tremor GU: Deferred  Resolved Hospital Problem list     Assessment & Plan:  Septic shock due to cholangitis, choledocholithiasis, LFT's to 54's GNR and GPR bacteremia -cefepime + metronidazole  -follow cultures for speciation -ERCP planned for 2/8, depending on cardiac status -follow CMP -norepi weaned to off  Mild pancreatitis on MRCP -check and follow lipase  Hypoxemia, at risk cardiogenic edema or ALI -CXR now -check BNP -wean O2 as able -pulm hygiene  Acute renal failure with anion gap metabolic acidosis due to renal function and lactic acidosis,  improved. -follow BMP and UOP -decrease LR to 50cc/h while NPO -lactic acid has cleared -Renal dose medications -Avoid nephrotoxins  Hypokalemia Hypomagnesemia -replace as indicated  Stress non-STEMI.  Appears to be due to hypoperfusion and septic  shock. -echo ordered -follow troponin for peak -ASA and heparin gtt started pending Cardiology eval and echocardiogram. Will hold for ERCP   Thrombocytopenia, likely due to sepsis -check DIC panel -follow closely as heparin initiated 2/8  History of alcohol use.  He has been cutting down over the last month -No evidence of withdrawal, follow and initiate CIWA if he develops symptoms -continue Thiamine, folate, vitamin C  History of hypertension. -continue to hold home lisinopril/HCTZ   Diabetes mellitus with hyperglycemia. -continue to Hold his metformin -SSI and CBG checks  Hyperlipidemia -holding statin with hepatic injury  Gout -continue to hold allopurinol until his AKI improves  Best Practice (right click and "Reselect all SmartList Selections" daily)   Diet/type: NPO w/ oral meds DVT prophylaxis: SCD GI prophylaxis: H2B Lines: N/A Foley:  N/A Code Status:  full code Last date of multidisciplinary goals of care discussion [discussed w patient 2/8]  Labs   CBC: Recent Labs  Lab 01/15/23 1011 01/16/23 0645 01/17/23 0037  WBC 11.3* 13.7* 10.8*  NEUTROABS  --  12.8*  --   HGB 14.7 13.7 11.1*  HCT 43.6 40.9 33.2*  MCV 104.8* 106.8* 106.8*  PLT 144* 112* 78*    Basic Metabolic Panel: Recent Labs  Lab 01/15/23 1011 01/16/23 0622 01/16/23 1425 01/17/23 0037  NA 138 136 137 135  K 4.0 3.2* 4.3 3.9  CL 102 102 99 102  CO2 25 18* 24 22  GLUCOSE 185* 123* 156* 98  BUN 33* 35* 31* 36*  CREATININE 1.19 2.03* 1.94* 1.64*  CALCIUM 9.8 8.7* 8.3* 8.0*  MG  --   --   --  1.6*  PHOS  --   --   --  3.4   GFR: Estimated Creatinine Clearance: 43.6 mL/min (A) (by C-G formula based on SCr of 1.64 mg/dL (H)). Recent Labs  Lab 01/15/23 1011 01/16/23 0645 01/16/23 0734 01/16/23 1029 01/16/23 1425 01/16/23 1656 01/17/23 0037  PROCALCITON  --   --   --   --  32.62  --  25.30  WBC 11.3* 13.7*  --   --   --   --  10.8*  LATICACIDVEN  --   --  6.1* 5.3* 3.5* 4.3*   --     Liver Function Tests: Recent Labs  Lab 01/15/23 1011 01/16/23 0622 01/16/23 1425 01/17/23 0037  AST 678* 324* 611* 807*  ALT 707* 505* 672* 833*  ALKPHOS 149* 151* 119 104  BILITOT 4.4* 5.8* 5.5* 5.1*  PROT 7.4 6.2* 5.3* 5.2*  ALBUMIN 4.2 3.5 2.8* 2.7*   Recent Labs  Lab 01/15/23 1011 01/16/23 0622 01/16/23 1656 01/17/23 0037  LIPASE '30 25 21 20   '$ No results for input(s): "AMMONIA" in the last 168 hours.  ABG No results found for: "PHART", "PCO2ART", "PO2ART", "HCO3", "TCO2", "ACIDBASEDEF", "O2SAT"   Coagulation Profile: Recent Labs  Lab 01/16/23 0734 01/16/23 1425 01/17/23 0037  INR 1.5* 1.7* 1.7*    Cardiac Enzymes: No results for input(s): "CKTOTAL", "CKMB", "CKMBINDEX", "TROPONINI" in the last 168 hours.  HbA1C: Hgb A1c MFr Bld  Date/Time Value Ref Range Status  01/17/2023 12:37 AM 6.6 (H) 4.8 - 5.6 % Final    Comment:    (NOTE) Pre diabetes:  5.7%-6.4%  Diabetes:              >6.4%  Glycemic control for   <7.0% adults with diabetes   03/01/2022 09:33 PM 6.4 (H) 4.8 - 5.6 % Final    Comment:    (NOTE) Pre diabetes:          5.7%-6.4%  Diabetes:              >6.4%  Glycemic control for   <7.0% adults with diabetes     CBG: Recent Labs  Lab 01/16/23 1637 01/16/23 1926 01/16/23 2327 01/17/23 0342 01/17/23 0750  GLUCAP 122* 106* 79 105* 110*     Critical care time: 35 min     Baltazar Apo, MD, PhD 01/17/2023, 9:17 AM [facility] [phone] or if no answer before 7:00PM call [phone] For any issues after 7:00PM please call eLink [phone]

## 2023-01-17 NOTE — Anesthesia Preprocedure Evaluation (Signed)
Anesthesia Evaluation  Patient identified by MRN, date of birth, ID band Patient awake    Reviewed: Allergy & Precautions, H&P , NPO status , Patient's Chart, lab work & pertinent test results  Airway Mallampati: II   Neck ROM: full    Dental   Pulmonary former smoker   breath sounds clear to auscultation       Cardiovascular hypertension,  Rhythm:regular Rate:Normal  Troponins elevated.  Cardiology following.     Neuro/Psych    GI/Hepatic ,GERD  ,,H/o cholecystectomy.   Pt currently admitted with biliary obstruction, pancreatitis, sepsis.   Endo/Other  diabetes, Type 2    Renal/GU      Musculoskeletal  (+) Arthritis ,    Abdominal   Peds  Hematology   Anesthesia Other Findings   Reproductive/Obstetrics                             Anesthesia Physical Anesthesia Plan  ASA: 3  Anesthesia Plan: General   Post-op Pain Management:    Induction: Intravenous  PONV Risk Score and Plan: 2 and Ondansetron, Dexamethasone, Midazolam and Treatment may vary due to age or medical condition  Airway Management Planned: Oral ETT  Additional Equipment:   Intra-op Plan:   Post-operative Plan: Extubation in OR  Informed Consent: I have reviewed the patients History and Physical, chart, labs and discussed the procedure including the risks, benefits and alternatives for the proposed anesthesia with the patient or authorized representative who has indicated his/her understanding and acceptance.     Dental advisory given  Plan Discussed with: CRNA, Anesthesiologist and Surgeon  Anesthesia Plan Comments:        Anesthesia Quick Evaluation

## 2023-01-17 NOTE — Op Note (Signed)
[facility] Patient Name: Victor Rojas Procedure Date : 01/17/2023 MRN: [id number] Attending MD: [name] , MD, [id number] Date of Birth: [birth year] CSN: [id number] Age: 77 Admit Type: Inpatient Procedure:                ERCP Indications:              Bile duct stone(s) on MRCP. Pt with suspected                            ascending cholangitis. Abnormal LFTs. H/O                            cholecystectomy in past Providers:                [name], MD, Dulcy Fanny, Janee Morn, Technician, Lance Coon, CRNA Referring MD:             Nathaniel Man Medicines:                General Anesthesia, Patient already on IV                            cefepime/Flagyl. Glucagon 0.2 mg IV Complications:            No immediate complications. Estimated Blood Loss:     Estimated blood loss: none. Procedure:                Pre-Anesthesia Assessment:                           - Prior to the procedure, a History and Physical                            was performed, and patient medications and                            allergies were reviewed. The patient's tolerance of                            previous anesthesia was also reviewed. The risks                            and benefits of the procedure and the sedation                            options and risks were discussed with the patient.                            All questions were answered, and informed consent                            was obtained. Prior Anticoagulants: The patient has  taken heparin, last dose was day of procedure. ASA                            Grade Assessment: III - A patient with severe                            systemic disease. After reviewing the risks and                            benefits, the patient was deemed in satisfactory                            condition to undergo the procedure.                           After  obtaining informed consent, the scope was                            passed under direct vision. Throughout the                            procedure, the patient's blood pressure, pulse, and                            oxygen saturations were monitored continuously. The                            [id number]-Q190V ([id number] Olympus duodenoscope was                            introduced through the mouth, and used to inject                            contrast into and used to inject contrast into the                            bile duct. The ERCP was accomplished without                            difficulty. The patient tolerated the procedure                            well. Scope In: Scope Out: Findings:      A scout film of the abdomen was obtained. Surgical clips, consistent       with a previous cholecystectomy, were seen in the area of the right       upper quadrant of the abdomen. The esophagus was successfully intubated       under direct vision. The scope was advanced to a normal major papilla in       the descending duodenum without detailed examination of the pharynx,       larynx and associated structures, and upper GI tract. The upper GI tract       was grossly normal.      The bile duct  was deeply cannulated with the short-nosed traction       sphincterotome on first attempt using wire-guided approach. Contrast was       injected. I personally interpreted the bile duct images. Ductal flow of       contrast was adequate. Image quality was adequate. Contrast extended to       the entire biliary tree except gallbladder. Multiple filling defects in       the distal CBD consistent with choledocholithiasis was found, largest       measuring 1 cm. CBD was dilated to 12 mm.The right and the left hepatic       ducts were mildly dilated. Cystic duct remnant did fill.      A 5 mm small biliary sphincterotomy was made with a monofilament       traction (standard) sphincterotome using ERBE  electrocautery (which used       to perform small sphincterotomy as patient was on heparin. has low       platelets, and elevated INR). There was no post-sphincterotomy bleeding.       This was followed by sphincteroplasty using 61m CRE balloon. The       biliary tree was swept with a 12 mm balloon starting at the bifurcation.       Three yellow stones were removed. Largest was oblong appox 10 mm.       Endoscopic photodocumentation was obtained. Postocclusion cholangiogram       did not reveal any residual stones. Bile was green. There was no pus. No       bleeding.      Pancreatic duct was intentionally not cannulated or injected. Indocin       suppositories were given preoperatively. Impression:               - Choledocholithiasis was found. Complete removal                            was accomplished by biliary                            sphincterotomy/sphincteroplasty and balloon                            extraction.                           - No pus was noted.                           - S/P previous cholecystectomy. Recommendation:           - Return patient to ICU for ongoing care.                           - Clear liquid diet.                           - Trend CBC, CMP, INR                           - Antibiotics for 5 days from GI standpoint. Can  D/C Flagyl from GI standpoint.                           - If heparin is needed, can restart in 8 hours.                           - Watch for pancreatitis, bleeding, perforation,                            and cholangitis.                           - The findings and recommendations were discussed                            with the patient's family. Procedure Code(s):        --- Professional ---                           [phone], 56, Endoscopic retrograde                            cholangiopancreatography (ERCP); with                            trans-endoscopic balloon dilation of                             biliary/pancreatic duct(s) or of ampulla                            (sphincteroplasty), including sphincterotomy, when                            performed, each duct                           43264, Endoscopic retrograde                            cholangiopancreatography (ERCP); with removal of                            calculi/debris from biliary/pancreatic duct(s)                           41638, Endoscopic catheterization of the biliary                            ductal system, radiological supervision and                            interpretation Diagnosis Code(s):        --- Professional ---                           K80.50, Calculus of bile duct without cholangitis  or cholecystitis without obstruction CPT copyright 2022 American Medical Association. All rights reserved. The codes documented in this report are preliminary and upon coder review may  be revised to meet current compliance requirements. [name], MD 01/17/2023 2:41:58 PM This report has been signed electronically. Number of Addenda: 0

## 2023-01-17 NOTE — Progress Notes (Signed)
NAME:  Victor Rojas, MRN:  [id number], DOB:  December 16, 1945, LOS: 1 ADMISSION DATE:  01/16/2023, CONSULTATION DATE:  01/16/2023 REFERRING MD:  Jori Moll [facility] ED), CHIEF COMPLAINT:  Septic shock  History of Present Illness:  Victor Rojas is a 77 y.o. male past medical history significant for hypertension, hyperlipidemia, diabetes, and prior cholecystectomy in 2022 who presented to [facility] ED (2/7) with dizziness.  Was seen day prior (2/6) at [facility] ED for abdominal pain and found to have elevation of LFTs and Tbili.  CT scan was concerning for choledocholithiasis.  Recommended ERCP at [facility], however patient left AMA.  Started to feel worse after returning home with new dizziness and lightheadedness.  Upon presentation to [facility] ED again, found to be hypotensive with troponin newly elevated to 2000 from 24 the day prior and lactate 6.1.  EKG with no ischemic changes.  Given 3 L IV fluid.  Started on cefepime and metronidazole as well as norepinephrine.  Discussed with GI at [facility] and transferred to ICU at [facility] pending MRCP and clinical stabilization for possible ERCP.  MRCP showed choledocholithiasis and ascending cholangitis and ERCP is planned for 2/8.  Troponins continued to trend up to 4000 with new O2 requirement and brief chest pain overnight 2/7-2/8; heparin drip started and cardiology consulted prior to ERCP.  Pertinent  Medical History  - HTN - HLD - Gout - Cholecystectomy 12/2020 - GERD - T2DM - Hiatal hernia - Diverticulosis - PAD s/p R endarterectomy 12/2018 - Hx colonic polyps  Significant Hospital Events: Including procedures, antibiotic start and stop dates in addition to other pertinent events   2/6 - Seen at [facility] with elevated LFTs and Tbili, CT concerning for choledocholithiasis, left AMA 2/7 - Returned to [facility] with sepsis; started on cefepime, metronidazole, and norepinephrine; transferred to [facility] and admitted to ICU; MRCP showing choledocholithiasis and ascending cholangitis 2/8 - Troponins  continuing to trend up, cardiology consulted and heparin gtt started; ERCP planned  Interim History / Subjective:  Had new 3L O2 requirement overnight with sats <90%, now on 2L.  Had some brief chest pain and dyspnea as well, denies chest pain and shortness of breath this morning.  No nausea, vomiting, diarrhea.  Key highlighted lab trends past 24 hours: Troponin: 2235 > 3925 > 4032 > 3106 Lactate: 5.3 > 3.5 > 4.3 AST: 324 > 807 ALT: 505 > 833 Tbili: 5.5 > 5.2 sCr: 1.94 > 1.64 Procal: 32.6 > 25.3 Platelets: 112 > 78 WBC: 13.7 > 10.8  Objective   Blood pressure (!) 109/52, pulse 77, temperature 97.6 F (36.4 C), temperature source Oral, resp. rate 16, weight 98.3 kg, SpO2 95 %.        Intake/Output Summary (Last 24 hours) at 01/17/2023 7829 Last data filed at 01/17/2023 0600 Gross per 24 hour  Intake 1367.26 ml  Output 805 ml  Net 562.26 ml   Filed Weights   01/17/23 0500  Weight: 98.3 kg   Examination: General: Tired-appearing male, resting in bed in no acute distress. HEENT: NCAT, MMM. PERRLA. Mild scleral icterus. Lungs: Normal WOB on 2L O2. Referred upper airway sounds, light crackles at lung bases bilaterally. Cardiovascular: Irregular rhythm, no tachycardia.  Normal S1/S2.  No murmurs/rubs/gallops. Abdomen: TTP in RUQ, no rebound or guarding. Normoactive bowel sounds. Mild abdominal distension. Extremities: 1+ pitting edema bilaterally. Capillary refill <2 seconds. Feet cool to touch bilaterally, but DP pulses 2+. Skin: Warm, no rashes grossly. Neuro: A&Ox4, no focal deficits. EOMS intact.  New Labs & Imaging  MRCP 2/7: - Choledocholithiasis with increased biliary ductal dilation,  periportal edema and peribiliary enhancement. Additionally there is heterogeneous enhancement of the hepatic parenchyma with associated areas of reduced diffusivity in the hepatic parenchyma. Findings which are highly concerning for choledocholithiasis with ascending cholangitis. - Increased  T2 signal about the pancreatic head which may reflect mild pancreatitis.  Assessment & Plan:  Septic shock in setting of choledocholithiasis complicated by cholangitis Patient remains off pressors, MAP stable around 70.  Blood Cx grew Enterobacter cloacae, covered by cefepime. - Continue cefepime and metronidazole - Recheck lipase and repeat tomorrow AM given MRCP findings and upcoming ERCP - Trend lactate - GI consulted, ERCP planned later today; NPO pending procedure - Daily CMP   Elevated troponin, uptrending Troponins continuing to trend up, doubling in past 24 hours, though now trending down.  Concern for NSTEMI versus acute on chronic heart failure in setting of sepsis versus stress non-STEMI.  Unclear what baseline cardiac function is, but does have risk factors for ACS, especially considering new O2 requirement and some chest pain overnight.  However, less concerned for NSTEMI at this time given underlying reasons for stress-induced cardiac strain.  ERCP planned this PM, will be careful with anticoagulation. - f/u repeat troponin - Cardiology consult - Heparin gtt without bolus, hold 2 hours prior to planned ERCP - Non-urgent TTE - Closely monitor EKG and fluid intake/output  Hypoxemia ARDS 2/2 sepsis or pancreatitis versus acute on chronic heart failure versus - CXR for new O2 requirement  Thrombocytopenia Platelets halved in past 24 hours in the setting of sepsis, must rule out DIC. - DIC panel  AKI, resolving Anion gap metabolic acidosis, resolved Lactic acidosis, improving AKI likely in setting of dehydration, continuing maintenance fluids. - Decrease maintenance fluids to 50 mL/hr from 75 mL/hr - f/u urinalysis - Strict I/Os - Daily CMP  Hypokalemia Hypomagnesemia Still some PVCs on EKG and nonspecific T wave abnormalities, but overall not too concerning.  K stable at 3.9, Mg 1.6 overnight, s/p 4 g mag sulfate. - f/u repeat BMP and Mg AM, replete as  necessary  Alcohol use disorder Last drink about 1 week ago, no need for CIWA precautions.  Given elevated MCV, will provide B12. - Thiamine, B12, and vitamin C supplementation   T2DM Hgb A1c 6.6 this admission.  Hold metformin during this admission. - SSI and Q4 CBGs   Gout Resume home allopurinol as AKI resolves.   HLD Resume atorvastatin as liver function improves.   HTN Hold home lisinopril-HCTZ in setting of hypotension.   Best Practice (right click and "Reselect all SmartList Selections" daily)   Diet/type: NPO w/ oral meds DVT prophylaxis: SCD GI prophylaxis: H2B Lines: N/A Foley:  N/A Code Status:  full code Last date of multidisciplinary goals of care discussion: w/ patient 2/8, updated family in room 2/7   Merrie Epler Gem, MS4 01/17/2023, 9:21 AM Johnell Comings, PCCM

## 2023-01-17 NOTE — Progress Notes (Addendum)
Patient completed his ERCP - stone cleared and no pus seen.   Will plan to d/c flagyl, continue cefepime.    Reviewed case with Dr Margaretann Loveless with Cardiology - OK to stop the heparin in absence strong suspicion for ACS. He will need an outpt ischemic workup. Has been seen at [facility] before.     Baltazar Apo, MD, PhD 01/17/2023, 2:49 PM [facility] [phone] or if no answer before 7:00PM call [phone] For any issues after 7:00PM please call eLink [phone]

## 2023-01-17 NOTE — Interval H&P Note (Signed)
History and Physical Interval Note:  01/17/2023 1:26 PM  Victor Rojas  has presented today for surgery, with the diagnosis of biliary obstruction.  The various methods of treatment have been discussed with the patient and family. After consideration of risks, benefits and other options for treatment, the patient has consented to  Procedure(s): ENDOSCOPIC RETROGRADE CHOLANGIOPANCREATOGRAPHY (ERCP) WITH PROPOFOL (N/A) as a surgical intervention.  The patient's history has been reviewed, patient examined, no change in status, stable for surgery.  I have reviewed the patient's chart and labs.  Questions were answered to the patient's satisfaction.     [name]

## 2023-01-17 NOTE — Consult Note (Addendum)
Cardiology Consultation   Patient ID: Victor Rojas MRN: [id number]; DOB: 07-30-1946  Admit date: 01/16/2023 Date of Consult: 01/17/2023  PCP:  Victor Hire, MD   [place] [facility] Cardiologist: Dr. Nehemiah Rojas [facility])  Patient Profile:   Victor Rojas is a 77 y.o. male with a hx of hypertension, hyperlipidemia, diabetes, prior cholecystectomy '22, PVD, carotid artery disease status post CEA of the right carotid artery '20 who is being seen 01/17/2023 for the evaluation of elevated troponin at the request of Dr. Lamonte Rojas.  History of Present Illness:   Victor Rojas is a 77 year old male with past medical history noted above.  He has been followed by [facility], Dr. Nehemiah Rojas.  He has a history of peripheral vascular disease with abnormal ABIs, as well as bilateral carotid artery stenosis with a history of a right carotid endarterectomy in 2020.  Nuclear stress test 10/2019 with no regional wall motion abnormalities or ischemia.  Echocardiogram at that time showed LVEF of 55%, mildly enlarged RV size and normal function, mild to moderate tricuspid valve insufficiency, mild mitral insufficiency with mild biatrial enlargement  He was last seen in the cardiology office on 2/1.  It was recommended that he follow-up with [facility] regarding his abnormal ABIs.  He was continued on aspirin, atorvastatin, amlodipine, lisinopril-hydrochlorothiazide and metoprolol.  He presented to [facility] on 2/6 for abdominal pain and found to have elevated LFTs and T. bili.  CT scan was concerning for choledocholithiasis.  It was recommended that he is referred to [facility] for an ERCP but he signed out AMA.  He presented back the following day with complaints of dizziness and lightheadedness.  He was found to be hypotensive.   Labs showed sodium 136, potassium 3.2, creatinine 2, alk phos 151, AST 324, ALT 505 T. bili 5.8, high-sensitivity troponin 2084, lactic 6.1, WBC 13.7, hemoglobin 13.7,  procalcitonin 32>>25, respiratory panel negative.  EKG showed sinus tachycardia, right bundle branch block, left anterior fascicular block, PACs.  He was called a code sepsis and started on IV fluids, antibiotics and norepinephrine.  Transferred to [facility] for further evaluation with GI consult.  Plan for MRCP and ERCP today.  Cardiology asked to evaluate in regards to elevated troponin.  Echocardiogram pending.  In talking with patient, he works part time at a carwash mostly taking the money, cleaning up and emptying trash. Also works around [employer], mows grass. Does get short of breath at times with these activities. Denies any chest pain prior to admission, mostly abd pain with nausea.   Past Medical History:  Diagnosis Date   Adenoma of colon    Arthritis    Diabetes mellitus without complication (HCC)    Diverticulosis    GERD (gastroesophageal reflux disease)    Hiatal hernia    Hiatal hernia    History of gout    History of kidney stones    Hyperlipemia    Hypertension    Peripheral vascular disease ([place])    Pre-diabetes     Past Surgical History:  Procedure Laterality Date   COLONOSCOPY     COLONOSCOPY WITH PROPOFOL N/A 06/18/2016   Procedure: COLONOSCOPY WITH PROPOFOL;  Surgeon: Lollie Sails, MD;  Location: [facility] ENDOSCOPY;  Service: Endoscopy;  Laterality: N/A;   COLONOSCOPY WITH PROPOFOL N/A 01/09/2021   Procedure: COLONOSCOPY WITH PROPOFOL;  Surgeon: Lesly Rubenstein, MD;  Location: [facility] ENDOSCOPY;  Service: Endoscopy;  Laterality: N/A;   ENDARTERECTOMY Right 12/19/2018   Procedure: ENDARTERECTOMY CAROTID;  Surgeon: Katha Cabal, MD;  Location: [facility] ORS;  Service: Vascular;  Laterality: Right;   ESOPHAGOGASTRODUODENOSCOPY (EGD) WITH PROPOFOL N/A 06/18/2016   Procedure: ESOPHAGOGASTRODUODENOSCOPY (EGD) WITH PROPOFOL;  Surgeon: Lollie Sails, MD;  Location: [facility] ENDOSCOPY;  Service: Endoscopy;  Laterality: N/A;   ESOPHAGOGASTRODUODENOSCOPY (EGD) WITH  PROPOFOL N/A 01/09/2021   Procedure: ESOPHAGOGASTRODUODENOSCOPY (EGD) WITH PROPOFOL;  Surgeon: Lesly Rubenstein, MD;  Location: [facility] ENDOSCOPY;  Service: Endoscopy;  Laterality: N/A;   HERNIA REPAIR     umbilical   TONSILLECTOMY       Home Medications:  Prior to Admission medications   Medication Sig Start Date End Date Taking? Authorizing Provider  allopurinol (ZYLOPRIM) 300 MG tablet Take 300 mg by mouth daily.    [provider]  amLODipine (NORVASC) 2.5 MG tablet Take 2.5 mg by mouth daily.    [provider]  aspirin EC 81 MG tablet Take 81 mg by mouth daily.     [provider]  atorvastatin (LIPITOR) 40 MG tablet Take 40 mg by mouth daily. 01/29/22   [provider]  Cholecalciferol (D3-1000) 25 MCG (1000 UT) tablet Take 1,000 Units by mouth daily.    [provider]  diphenhydrAMINE (BENADRYL) 25 mg capsule Take 25 mg by mouth daily.    [provider]  lisinopril-hydrochlorothiazide (PRINZIDE,ZESTORETIC) 20-12.5 MG tablet Take 2 tablets by mouth daily.     [provider]  metFORMIN (GLUCOPHAGE) 500 MG tablet Take 500 mg by mouth 2 (two) times daily. 09/02/21   [provider]  metoprolol tartrate (LOPRESSOR) 25 MG tablet Take 25 mg by mouth 2 (two) times daily.    [provider]  Multiple Vitamin (MULTI-VITAMINS) TABS Take 1 tablet by mouth daily.     [provider]  pantoprazole (PROTONIX) 40 MG tablet Take 40 mg by mouth daily. 09/12/21   [provider]  vitamin B-12 (CYANOCOBALAMIN) 1000 MCG tablet Take 1,000 mcg by mouth daily.    [provider]  vitamin C (ASCORBIC ACID) 500 MG tablet Take 500 mg by mouth daily.    [provider]    Inpatient Medications: Scheduled Meds:  ascorbic acid  500 mg Oral Daily   aspirin EC  81 mg Oral Daily   Chlorhexidine Gluconate Cloth  6 each Topical Daily   cholecalciferol  1,000 Units Oral Daily   cyanocobalamin  1,000  mcg Oral Daily   famotidine  20 mg Oral QHS   insulin aspart  0-15 Units Subcutaneous Q4H   multivitamin with minerals  1 tablet Oral Daily   Continuous Infusions:  sodium chloride     ceFEPime (MAXIPIME) IV 2 g (01/17/23 0903)   heparin 1,100 Units/hr (01/17/23 0915)   lactated ringers 75 mL/hr at 01/17/23 0611   metronidazole Stopped (01/16/23 2231)   potassium chloride     PRN Meds: oxyCODONE  Allergies:   No Known Allergies  Social History:   Social History   Socioeconomic History   Marital status: Married    Spouse name: Not on file   Number of children: Not on file   Years of education: Not on file   Highest education level: Not on file  Occupational History   Not on file  Tobacco Use   Smoking status: Former    Types: Cigarettes    Quit date: 02/03/1989    Years since quitting: 33.9   Smokeless tobacco: Never  Vaping Use   Vaping Use: Never used  Substance and Sexual Activity  Alcohol use: Yes    Alcohol/week: 6.0 standard drinks of alcohol    Types: 6 Cans of beer per week   Drug use: No   Sexual activity: Not on file  Other Topics Concern   Not on file  Social History Narrative   Not on file   Social Determinants of Health   Financial Resource Strain: Not on file  Food Insecurity: Not on file  Transportation Needs: Not on file  Physical Activity: Not on file  Stress: Not on file  Social Connections: Not on file  Intimate Partner Violence: Not on file    Family History:    Family History  Problem Relation Age of Onset   Cataracts Mother    Heart attack Mother    Cancer Sister      ROS:  Please see the history of present illness.   All other ROS reviewed and negative.     Physical Exam/Data:   Vitals:   01/17/23 0400 01/17/23 0500 01/17/23 0600 01/17/23 0752  BP: (!) 102/55  (!) 109/52   Pulse: 79  77   Resp: 16  16   Temp:    97.6 F (36.4 C)  TempSrc:    Oral  SpO2: 93%  95%   Weight:  98.3 kg      Intake/Output Summary  (Last 24 hours) at 01/17/2023 1004 Last data filed at 01/17/2023 0600 Gross per 24 hour  Intake 1367.26 ml  Output 805 ml  Net 562.26 ml      01/17/2023    5:00 AM 01/16/2023    6:21 AM 03/26/2022   11:14 AM  Last 3 Weights  Weight (lbs) 216 lb 11.4 oz 208 lb 203 lb  Weight (kg) 98.3 kg 94.348 kg 92.08 kg     Body mass index is 32.95 kg/m.  General:  Obese older male, sitting up in bed. Appears comfortable HEENT: normal Neck: no JVD Vascular: No carotid bruits; Distal pulses 2+ bilaterally Cardiac:  normal S1, S2; RRR; no murmur  Lungs:  clear to auscultation bilaterally, no wheezing, rhonchi or rales  Abd: tenderness in the RU/LQ Ext: trace LE edema Musculoskeletal:  No deformities, BUE and BLE strength normal and equal Skin: warm and dry  Neuro:  CNs 2-12 intact, no focal abnormalities noted Psych:  Normal affect   EKG:  The EKG was personally reviewed and demonstrates:  sinus tachycardia, right bundle branch block, left anterior fascicular block, PACs Telemetry:  Telemetry was personally reviewed and demonstrates:  Sinus rhythm, PACs, PVCs  Relevant CV Studies:  Echo: pending  Laboratory Data:  High Sensitivity Troponin:   Recent Labs  Lab 01/16/23 0645 01/16/23 1029 01/16/23 1425 01/16/23 1656 01/17/23 0834  TROPONINIHS 2,084* 2,235* 3,925* 4,032* 3,106*     Chemistry Recent Labs  Lab 01/16/23 0622 01/16/23 1425 01/17/23 0037  NA 136 137 135  K 3.2* 4.3 3.9  CL 102 99 102  CO2 18* 24 22  GLUCOSE 123* 156* 98  BUN 35* 31* 36*  CREATININE 2.03* 1.94* 1.64*  CALCIUM 8.7* 8.3* 8.0*  MG  --   --  1.6*  GFRNONAA 33* 35* 43*  ANIONGAP 16* 14 11    Recent Labs  Lab 01/16/23 0622 01/16/23 1425 01/17/23 0037  PROT 6.2* 5.3* 5.2*  ALBUMIN 3.5 2.8* 2.7*  AST 324* 611* 807*  ALT 505* 672* 833*  ALKPHOS 151* 119 104  BILITOT 5.8* 5.5* 5.1*   Lipids No results for input(s): "CHOL", "TRIG", "HDL", "LABVLDL", "LDLCALC", "CHOLHDL" in  the last 168 hours.   Hematology Recent Labs  Lab 01/15/23 1011 01/16/23 0645 01/17/23 0037  WBC 11.3* 13.7* 10.8*  RBC 4.16* 3.83* 3.11*  HGB 14.7 13.7 11.1*  HCT 43.6 40.9 33.2*  MCV 104.8* 106.8* 106.8*  MCH 35.3* 35.8* 35.7*  MCHC 33.7 33.5 33.4  RDW 13.3 14.0 14.4  PLT 144* 112* 78*   Thyroid No results for input(s): "TSH", "FREET4" in the last 168 hours.  BNPNo results for input(s): "BNP", "PROBNP" in the last 168 hours.  DDimer No results for input(s): "DDIMER" in the last 168 hours.   Radiology/Studies:  DG Chest Port 1 View  Result Date: 01/17/2023 CLINICAL DATA:  Respiratory failure EXAM: PORTABLE CHEST 1 VIEW COMPARISON:  None Available. FINDINGS: Normal mediastinum and cardiac silhouette. Normal pulmonary vasculature. No evidence of effusion, infiltrate, or pneumothorax. No acute bony abnormality. IMPRESSION: No acute cardiopulmonary process. Electronically Signed   By: Suzy Bouchard M.D.   On: 01/17/2023 09:38   MR ABDOMEN MRCP W WO CONTAST  Result Date: 01/16/2023 CLINICAL DATA:  Jaundice EXAM: MRI ABDOMEN WITHOUT AND WITH CONTRAST (INCLUDING MRCP) TECHNIQUE: Multiplanar multisequence MR imaging of the abdomen was performed both before and after the administration of intravenous contrast. Heavily T2-weighted images of the biliary and pancreatic ducts were obtained, and three-dimensional MRCP images were rendered by post processing. CONTRAST:  60m GADAVIST GADOBUTROL 1 MMOL/ML IV SOLN COMPARISON:  Multiple priors including CT and ultrasound dated January 15, 2022. FINDINGS: Despite efforts by the technologist and patient, motion artifact is present on today's exam and could not be eliminated. This reduces exam sensitivity and specificity. Lower chest: Heterogeneous signal in the bilateral lung bases commonly reflects atelectasis. Hepatobiliary: Periportal edema with peribiliary enhancement and increased biliary ductal dilation, the common duct measuring 11 mm in diameter. There are a few T2  hypointense foci in the distal duct on images 30/3 and 32/4. Heterogeneous enhancement of the hepatic parenchyma with corresponding reduced diffusivity in these areas. Diffuse hepatic steatosis.  Slight nodular hepatic contours. Pancreas:  Increased T2 signal about the pancreatic head. Spleen:  No splenomegaly. Adrenals/Urinary Tract: Bilateral adrenal glands appear normal. Similar left renal atrophy. No hydronephrosis. Stomach/Bowel: Mild wall thickening of the ascending colon. Vascular/Lymphatic: Prominent right upper quadrant lymph nodes for instance measuring 14 mm on image 45/1204. The portal, splenic and superior mesenteric veins are patent. Smooth IVC contours. Aortic atherosclerosis. Other: No walled off fluid collections. Trace perihepatic free fluid. Musculoskeletal: No suspicious bone lesions identified. IMPRESSION: Examination is significantly degraded by respiratory motion limiting sensitivity and specificity. Within this context: 1. Choledocholithiasis with increased biliary ductal dilation, periportal edema and peribiliary enhancement. Additionally there is heterogeneous enhancement of the hepatic parenchyma with associated areas of reduced diffusivity in the hepatic parenchyma. Findings which are highly concerning for choledocholithiasis with ascending cholangitis. 2. Increased T2 signal about the pancreatic head which may reflect mild pancreatitis. 3. Mild wall thickening of the ascending colon may be reactive or reflect colitis. 4. Diffuse hepatic steatosis with slight nodular hepatic contours which may reflect early cirrhosis. 5. Prominent right upper quadrant lymph nodes are nonspecific and may be reactive. 6. Heterogeneous signal in the bilateral lung bases commonly reflects atelectasis. These results will be called to the ordering clinician or representative by the Radiologist Assistant, and communication documented in the PACS or [employer] Electronically Signed   By: JDahlia Bailiff M.D.   On: 01/16/2023 16:41   CT ABDOMEN PELVIS W CONTRAST  Addendum Date: 01/15/2023   ADDENDUM REPORT:  01/15/2023 15:21 ADDENDUM: There is mild increased density within the lumen of the distal common bile duct that appears new compared to the 03/01/2022 CT prior to the interval cholecystectomy (current CT axial series 3 images 33 through 38) which may represent a cluster of common bile duct stones measuring up to approximately 4 mm in transverse dimension and 11 mm in craniocaudal length (coronal series 5, image 45). Critical Value/emergent results were called by telephone at the time of interpretation on 01/15/2023 at 3:15 pm to provider [facility] , who verbally acknowledged these results. Electronically Signed   By: Yvonne Kendall M.D.   On: 01/15/2023 15:21   Result Date: 01/15/2023 CLINICAL DATA:  Epigastric pain. Started last night. Pain thyroid and Rojas of abdomen and radiates upwards. Some nausea. EXAM: CT ABDOMEN AND PELVIS WITH CONTRAST TECHNIQUE: Multidetector CT imaging of the abdomen and pelvis was performed using the standard protocol following bolus administration of intravenous contrast. RADIATION DOSE REDUCTION: This exam was performed according to the departmental dose-optimization program which includes automated exposure control, adjustment of the mA and/or kV according to patient size and/or use of iterative reconstruction technique. CONTRAST:  158m OMNIPAQUE IOHEXOL 300 MG/ML  SOLN COMPARISON:  Right upper quadrant abdominal ultrasound 03/01/2022; CT abdomen and pelvis 03/01/2022 FINDINGS: Lower chest: There is mild curvilinear and ground-glass likely subsegmental atelectasis and/or scarring within the bilateral lung bases. Minimal left foraminal right posterior pleural thickening appears unchanged from prior there is no definite pleural effusion. Dense coronary artery calcifications are again noted. No pericardial effusion. Cardiac silhouette is again at the upper limits of normal  size to mildly enlarged. Possible minimal circumferential esophageal thickening is unchanged to mildly improved from prior and may be secondary to underdistention versus the sequela of prior esophagitis. Hepatobiliary: Smooth liver contours. Interval cholecystectomy. Minimal central intrahepatic biliary ductal dilatation and mild prominence of the common bile duct, likely normal postsurgical changes following cholecystectomy. No focal liver lesion is seen. Pancreas: Unremarkable. No pancreatic ductal dilatation or surrounding inflammatory changes. Spleen: Unchanged two individual calcifications within the spleen, possibly the sequela of remote granulomatous infection. Adrenals/Urinary Tract: Normal adrenals. There is again mild high-grade atrophy of the left kidney, chronic. There again may be minimally delayed left renal cortical enhancement compared to the right. Vascular calcifications are again seen within the bilateral renal hila. No hydronephrosis. No renal or ureteral stones are seen. The bilateral ureters are normal in caliber. No focal urinary bladder wall thickening. Stomach/Bowel: The terminal ileum is unremarkable. Normal appendix. No dilated loops of bowel are seen to indicate bowel obstruction. No bowel wall thickening. Vascular/Lymphatic: No abdominal aortic aneurysm. High-grade atherosclerotic calcifications within the aorta, bilateral iliac arteries, and origins of the major intra-abdominal aortic branch vessels, similar to prior. No mesenteric, retroperitoneal, or pelvic lymphadenopathy. Reproductive: The prostate is again mildly enlarged. The seminal vesicles are grossly unremarkable. Other: A fat containing left paraumbilical hernia is again seen with orifice measuring up to 11 mm and ventral herniated fat measuring up to approximately 4.7 cm in craniocaudal dimension and 5.1 cm in transverse dimension. This is similar in size to prior but there is new mild inflammatory stranding within the  herniated fat (axial series 3 images 54 through 62). Small fat containing right inguinal hernia is unchanged. No free air or free fluid is seen within the abdomen or pelvis. Musculoskeletal: Moderate multilevel degenerative disc changes of the thoracic spine. Mild levocurvature centered at L3. Mild chronic anterior T11 vertebral body height loss. Large anterior T9-10 and T10-11  bridging osteophytes. IMPRESSION: Compared to 03/01/2022: 1. Interval cholecystectomy. 2. Unchanged size of fat containing left paraumbilical hernia. There is new mild inflammatory stranding within the herniated fat. This may represent a source of pain. Recommend clinical correlation. 3. Unchanged chronic left renal atrophy. 4. Unchanged severe atherosclerotic calcifications within the aorta, bilateral iliac arteries, and origins of the major intra-abdominal aortic branch vessels. Electronically Signed: By: Yvonne Kendall M.D. On: 01/15/2023 12:57   US ABDOMEN LIMITED RUQ (LIVER/GB)  Result Date: 01/15/2023 CLINICAL DATA:  Elevated liver function tests EXAM: ULTRASOUND ABDOMEN LIMITED RIGHT UPPER QUADRANT COMPARISON:  CT of earlier today. FINDINGS: Gallbladder: Surgically absent Common bile duct: Diameter: Within normal limits after cholecystectomy, 8 mm. Liver: Mildly heterogeneously increased hepatic echogenicity. Portal vein is patent on color Doppler imaging with normal direction of blood flow towards the liver. Other: None. IMPRESSION: Suspect mild hepatic steatosis. No other explanation for elevated liver function tests. Electronically Signed   By: Abigail Miyamoto M.D.   On: 01/15/2023 15:05     Assessment and Plan:   Victor Rojas is a 77 y.o. male with a hx of hypertension, hyperlipidemia, diabetes, prior cholecystectomy '22, PVD, carotid artery disease status post CEA of the right carotid artery '20 who is being seen 01/17/2023 for the evaluation of elevated troponin at the request of Dr. Lamonte Rojas.  NSTEMI (suspect type II) --  hsTn 2084>>3925>>4032>>3106. EKG with new RBBB/LAFB in comparison to prior tracing. He denies any anginal symptoms prior to admission. Prior stress from 2020 was normal. Given acute illness, suspect may be demand ischemia. Echo completed with official read pending. Does have coronary calcifications on prior CT, will need eventual cardiac work up but would defer to outpatient setting pending pending stable echo. -- currently on ASA, IV heparin (started initially but currently held), unable to add statin with elevated LFTs  Septic shock secondary to suspected cholangitis/possible choledocholithiasis Elevated LFTs Pancreatitis  -- lactic acid 6.1, procal 35 on admission -- initially required pressor support -- IV antibiotics per primary -- MRCP with choledocholithiasis and ascending cholangitis, mild pancreatitis  -- GI following with plans for ERCP, this was delayed today in the setting of elevated troponins  Thrombocytopenia -- platelets 112>>78 -- follow with closely on IV heparin, ASA -- DIC panel pending  AKI Metabolic acidosis -- Cr elevated at 2.03 on admission, improved 1.64 with IVFs  HLD -- statin held presently, resume when appropriate   For questions or updates, please contact [place] Please consult [email or web address] for contact info under    Signed, Reino Bellis, NP  01/17/2023 10:04 AM  Patient seen and examined with Harlan Stains, NP.  Agree as above, with the following exceptions and changes as noted below.  77 year old gentleman currently admitted with choledocholithiasis and septic shock due to cholangitis.  I saw the patient in endoscopy prior to his planned ERCP.  Cardiology consulted for elevated troponins and episode of chest discomfort.  I reviewed a CT abdomen pelvis from 01/15/2023 which does demonstrate circumflex coronary artery calcifications, he appears to have a left dominant circulation.  I am not able to see the remainder of the coronary  arteries very well.  This is indicative of coronary artery disease.  However he has not had significant chest discomfort as an outpatient.  He does have shortness of breath with activity and when working in the yard he does have to stop on occasion due to shortness of breath. gen: NAD, CV: RRR, no murmurs, Lungs: clear, Abd: Soft and tender,  Extrem: Warm, well perfused, no edema, Neuro/Psych: alert and oriented x 3, normal mood and affect. All available labs, radiology testing, previous records reviewed.   The patient's primary issue at this time seems to be cholangitis/choledocholithiasis with septic shock and he is planned to have an ERCP.  Anesthesia is aware of the patient's elevated troponin.  We performed an echocardiogram for further restratification which showed normal biventricular function and no significant valvular heart disease.  This is reassuring in the setting of the rising troponin.  Troponin elevation likely represents demand ischemia in the setting of coronary artery disease noted based on the presence of coronary artery calcifications.  At some point he does warrant an ischemic evaluation.  However with significant metabolic derangements, renal dysfunction, hepatic and synthetic dysfunction with an INR of 1.6, now would not be the appropriate time for an invasive cardiac evaluation.  I discussed all this with the patient who demonstrates understanding.  Cardiology will follow along during the patient's hospitalization.  Fortunately troponins have peaked and are now downtrending.  Elouise Munroe, MD 01/17/23 1:04 PM

## 2023-01-17 NOTE — Progress Notes (Signed)
[facility] Progress Note Patient Name: Victor Rojas DOB: 01/06/1946 MRN: [id number]   Date of Service  01/17/2023  HPI/Events of Note  Blood culture BCID positive for Enterobacter cloacae. Discussed with pharmacy. Patient is already on Cefepime which should cover the Enterobacter cloacae.  eICU Interventions  Continue present management.      Intervention Category Major Interventions: Infection - evaluation and management  Ravin Denardo Eugene 01/17/2023, 12:27 AM

## 2023-01-17 NOTE — Progress Notes (Signed)
Dr. Lyndel Safe was made aware that patient is still too sleepy for po and IV fluids are d/c'd. No new orders at this time. Will monitor and notify MD if unable to take po's after a couple of hours.

## 2023-01-17 NOTE — Plan of Care (Signed)
Patient given patient education handout on ERCP.

## 2023-01-17 NOTE — Anesthesia Postprocedure Evaluation (Signed)
Anesthesia Post Note  Patient: Victor Rojas  Procedure(s) Performed: ENDOSCOPIC RETROGRADE CHOLANGIOPANCREATOGRAPHY (ERCP) WITH PROPOFOL SPHINCTEROTOMY BILIARY DILATION REMOVAL OF STONES     Patient location during evaluation: PACU Anesthesia Type: General Level of consciousness: sedated and patient cooperative Pain management: pain level controlled Vital Signs Assessment: post-procedure vital signs reviewed and stable Respiratory status: spontaneous breathing Cardiovascular status: stable Anesthetic complications: no   No notable events documented.  Last Vitals:  Vitals:   01/17/23 1953 01/17/23 2000  BP:  (!) 116/58  Pulse:  65  Resp:  13  Temp: 36.7 C   SpO2:  94%    Last Pain:  Vitals:   01/17/23 2000  TempSrc:   PainSc: 0-No pain                 Nolon Nations

## 2023-01-17 NOTE — Progress Notes (Addendum)
ANTICOAGULATION CONSULT NOTE - Initial Consult  Pharmacy Consult for heparin gtt Indication: chest pain/ACS  No Known Allergies  Patient Measurements: Weight: 98.3 kg (216 lb 11.4 oz) Heparin Dosing Weight: 88.2 kg  Vital Signs: Temp: 97.6 F (36.4 C) (02/08 0752) Temp Source: Oral (02/08 0752) BP: 109/52 (02/08 0600) Pulse Rate: 77 (02/08 0600)  Labs: Recent Labs    01/15/23 1011 01/15/23 1322 01/16/23 0622 01/16/23 0645 01/16/23 0734 01/16/23 1029 01/16/23 1425 01/16/23 1656 01/17/23 0037  HGB 14.7  --   --  13.7  --   --   --   --  11.1*  HCT 43.6  --   --  40.9  --   --   --   --  33.2*  PLT 144*  --   --  112*  --   --   --   --  78*  APTT  --   --   --   --  37*  --  37*  --   --   LABPROT  --   --   --   --  18.3*  --  19.9*  --  20.1*  INR  --   --   --   --  1.5*  --  1.7*  --  1.7*  CREATININE 1.19  --  2.03*  --   --   --  1.94*  --  1.64*  TROPONINIHS 20*   < >  --  2,084*  --  2,235* 3,925* 4,032*  --    < > = values in this interval not displayed.    Estimated Creatinine Clearance: 43.6 mL/min (A) (by C-G formula based on SCr of 1.64 mg/dL (H)).   Medical History: Past Medical History:  Diagnosis Date   Adenoma of colon    Arthritis    Diabetes mellitus without complication (HCC)    Diverticulosis    GERD (gastroesophageal reflux disease)    Hiatal hernia    Hiatal hernia    History of gout    History of kidney stones    Hyperlipemia    Hypertension    Peripheral vascular disease (HCC)    Pre-diabetes     Assessment: 77 yo M with PMH HLD, HTN, PAD now with rising troponins (41>9622). Pharmacy consulted for heparin drip in setting of c/f NSTEMI.   CBC: Hgb 11.1/Plt 78  Goal of Therapy:  Heparin level 0.3-0.7 units/ml Monitor platelets by anticoagulation protocol: Yes   Plan:  No bolus per provider  Start heparin at 1100 units/hr - will plan to hold heparin 2 hours prior to planned ERCP today at 1300  F/u repeat troponins  F/u  CBC and s/sx bleeding   Wilson Singer, PharmD Clinical Pharmacist 01/17/2023 8:06 AM

## 2023-01-17 NOTE — Anesthesia Procedure Notes (Signed)
Procedure Name: Intubation Date/Time: 01/17/2023 1:39 PM  Performed by: Lance Coon, CRNAPre-anesthesia Checklist: Patient identified, Emergency Drugs available, Suction available, Patient being monitored and Timeout performed Patient Re-evaluated:Patient Re-evaluated prior to induction Oxygen Delivery Method: Circle system utilized Preoxygenation: Pre-oxygenation with 100% oxygen Induction Type: IV induction Ventilation: Mask ventilation without difficulty Laryngoscope Size: Miller and 3 Grade View: Grade I Tube type: Oral Tube size: 7.5 mm Number of attempts: 1 Airway Equipment and Method: Stylet Placement Confirmation: ETT inserted through vocal cords under direct vision, positive ETCO2 and breath sounds checked- equal and bilateral Secured at: 21 cm Tube secured with: Tape Dental Injury: Teeth and Oropharynx as per pre-operative assessment

## 2023-01-17 NOTE — TOC Initial Note (Addendum)
Transition of Care [facility]) - Initial/Assessment Note    Patient Details  Name: Victor Rojas MRN: [id number] Date of Birth: 16-Jun-1946  [facility] CM/SW Contact:    Tom-Johnson, Renea Ee, RN Phone Number: 01/17/2023, 2:37 PM  Clinical Narrative:                  CM spoke with patient at bedside about needs for post hospital transition.  Patient is admitted for Septic Shock. Patient went to [facility] with c/o abd pain left AMA and returned with worsening symptoms, found to be septic, started on IV abx and transferred to [facility]. MRCP shows Choledocholithiasis and Ascending Cholangitis. Scheduled ERCP for Biliary Obstruction today. On 2L O@ acute, Heparin gtt.  From home alone. Has a step daughter, Lynelle Smoke who is at bedside. Independent with care prior to admission. Has a cane, walker, w/c and shower seat at home.  PCP is Baxter Hire, MD and uses [facility] [address] in [place].   No TOC needs or recommendations noted at this time. CM will continue to follow as patient progresses with care towards discharge.            Barriers to Discharge: Continued Medical Work up   Patient Goals and CMS Choice Patient states their goals for this hospitalization and ongoing recovery are:: To return home CMS Medicare.gov Compare Post Acute Care list provided to:: Patient Choice offered to / list presented to : Patient      Expected Discharge Plan and Services   Discharge Planning Services: CM Consult   Living arrangements for the past 2 months: Single Family Home                                      Prior Living Arrangements/Services Living arrangements for the past 2 months: Single Family Home Lives with:: Self Patient language and need for interpreter reviewed:: Yes Do you feel safe going back to the place where you live?: Yes      Need for Family Participation in Patient Care: Yes (Comment) Care giver support system in place?: Yes  (comment) Current home services: DME (Cane, walker, w/c, shower seat) Criminal Activity/Legal Involvement Pertinent to Current Situation/Hospitalization: No - Comment as needed  Activities of Daily Living      Permission Sought/Granted Permission sought to share information with : Case Manager, Family Supports Permission granted to share information with : Yes, Verbal Permission Granted              Emotional Assessment Appearance:: Appears stated age Attitude/Demeanor/Rapport: Engaged, Gracious Affect (typically observed): Accepting, Appropriate, Calm, Hopeful, Pleasant Orientation: : Oriented to Self, Oriented to Place, Oriented to  Time, Oriented to Situation Alcohol / Substance Use: Not Applicable Psych Involvement: No (comment)  Admission diagnosis:  Septic shock ([place]) [A41.9, R65.21] Patient Active Problem List   Diagnosis Date Noted   Septic shock ([place]) 01/16/2023   Atherosclerosis of native arteries of extremity with intermittent claudication ([place]) 03/26/2022   Diabetes ([place]) 03/26/2022   Cholecystitis 03/03/2022   Acute cholecystitis 03/01/2022   PAD (peripheral artery disease) ([place]) 11/11/2019   Carotid stenosis, asymptomatic, right 12/19/2018   Psoriasis 37/90/2409   Umbilical hernia without obstruction and without gangrene 08/08/2018   Bilateral carotid artery stenosis 05/06/2018   History of TIA (transient ischemic attack) 04/15/2018   Hyperlipidemia, mixed 01/08/2018   Abnormal LFTs (liver function tests) 01/17/2015   Primary  thrombocytopenia ([place]) 01/17/2015   Paroxysmal SVT (supraventricular tachycardia) 11/25/2014   Hypertension 07/16/2014   History of gout 07/16/2014   PCP:  Baxter Hire, MD Pharmacy:   [facility] [address], [place] - [place] [address] [place] [address] Phone: [phone] Fax: [phone]     Social Determinants of Health (SDOH) Social History: SDOH Screenings   Tobacco Use: Medium Risk  (01/17/2023)   SDOH Interventions:     Readmission Risk Interventions     No data to display

## 2023-01-17 NOTE — Progress Notes (Signed)
[facility] ADULT ICU REPLACEMENT PROTOCOL   The patient does apply for the [facility] Adult ICU Electrolyte Replacment Protocol based on the criteria listed below:   1.Exclusion criteria: TCTS, ECMO, Dialysis, and Myasthenia Gravis patients 2. Is GFR >/= 30 ml/min? Yes.    Patient's GFR today is 43 3. Is SCr </= 2? Yes.   Patient's SCr is 1.64 mg/dL 4. Did SCr increase >/= 0.5 in 24 hours? No. 5.Pt's weight >40kg  Yes.   6. Abnormal electrolyte(s):   Mg 1.6  7. Electrolytes replaced per protocol 8.  Call MD STAT for K+ </= 2.5, Phos </= 1, or Mag </= 1 Physician:  S. Rosie Fate R Tanieka Pownall 01/17/2023 5:56 AM

## 2023-01-17 NOTE — Progress Notes (Signed)
Echocardiogram 2D Echocardiogram has been performed.  Victor Rojas 01/17/2023, 10:14 AM

## 2023-01-18 DIAGNOSIS — R6521 Severe sepsis with septic shock: Secondary | ICD-10-CM | POA: Diagnosis not present

## 2023-01-18 DIAGNOSIS — K8309 Other cholangitis: Secondary | ICD-10-CM

## 2023-01-18 DIAGNOSIS — A419 Sepsis, unspecified organism: Secondary | ICD-10-CM | POA: Diagnosis not present

## 2023-01-18 LAB — COMPREHENSIVE METABOLIC PANEL
ALT: 660 U/L — ABNORMAL HIGH (ref 0–44)
AST: 388 U/L — ABNORMAL HIGH (ref 15–41)
Albumin: 2.6 g/dL — ABNORMAL LOW (ref 3.5–5.0)
Alkaline Phosphatase: 147 U/L — ABNORMAL HIGH (ref 38–126)
Anion gap: 8 (ref 5–15)
BUN: 46 mg/dL — ABNORMAL HIGH (ref 8–23)
CO2: 25 mmol/L (ref 22–32)
Calcium: 8.1 mg/dL — ABNORMAL LOW (ref 8.9–10.3)
Chloride: 98 mmol/L (ref 98–111)
Creatinine, Ser: 1.97 mg/dL — ABNORMAL HIGH (ref 0.61–1.24)
GFR, Estimated: 35 mL/min — ABNORMAL LOW (ref >60)
Glucose, Bld: 220 mg/dL — ABNORMAL HIGH (ref 70–99)
Potassium: 4.8 mmol/L (ref 3.5–5.1)
Sodium: 131 mmol/L — ABNORMAL LOW (ref 135–145)
Total Bilirubin: 4.2 mg/dL — ABNORMAL HIGH (ref 0.3–1.2)
Total Protein: 5.7 g/dL — ABNORMAL LOW (ref 6.5–8.1)

## 2023-01-18 LAB — GLUCOSE, CAPILLARY
Glucose-Capillary: 158 mg/dL — ABNORMAL HIGH (ref 70–99)
Glucose-Capillary: 169 mg/dL — ABNORMAL HIGH (ref 70–99)
Glucose-Capillary: 212 mg/dL — ABNORMAL HIGH (ref 70–99)
Glucose-Capillary: 201 mg/dL — ABNORMAL HIGH (ref 70–99)
Glucose-Capillary: 195 mg/dL — ABNORMAL HIGH (ref 70–99)
Glucose-Capillary: 205 mg/dL — ABNORMAL HIGH (ref 70–99)

## 2023-01-18 LAB — MAGNESIUM: Magnesium: 2.8 mg/dL — ABNORMAL HIGH (ref 1.7–2.4)

## 2023-01-18 LAB — PHOSPHORUS: Phosphorus: 3.1 mg/dL (ref 2.5–4.6)

## 2023-01-18 LAB — CBC
HCT: 35 % — ABNORMAL LOW (ref 39.0–52.0)
Hemoglobin: 11.6 g/dL — ABNORMAL LOW (ref 13.0–17.0)
MCH: 35.9 pg — ABNORMAL HIGH (ref 26.0–34.0)
MCHC: 33.1 g/dL (ref 30.0–36.0)
MCV: 108.4 fL — ABNORMAL HIGH (ref 80.0–100.0)
Platelets: 76 10*3/uL — ABNORMAL LOW (ref 150–400)
RBC: 3.23 MIL/uL — ABNORMAL LOW (ref 4.22–5.81)
RDW: 13.9 % (ref 11.5–15.5)
WBC: 5.7 10*3/uL (ref 4.0–10.5)
nRBC: 0 % (ref 0.0–0.2)

## 2023-01-18 LAB — LIPASE, BLOOD: Lipase: 26 U/L (ref 11–51)

## 2023-01-18 LAB — PROCALCITONIN: Procalcitonin: 13.97 ng/mL

## 2023-01-18 MED ORDER — PANTOPRAZOLE SODIUM 40 MG PO TBEC
40.0000 mg | DELAYED_RELEASE_TABLET | Freq: Every day | ORAL | Status: DC
  Administered 2023-01-18 – 2023-01-21 (×4): 40 mg via ORAL
  Filled 2023-01-18 (×4): qty 1

## 2023-01-18 MED ORDER — HEPARIN SODIUM (PORCINE) 5000 UNIT/ML IJ SOLN
5000.0000 [IU] | Freq: Three times a day (TID) | INTRAMUSCULAR | Status: DC
  Administered 2023-01-18 – 2023-01-21 (×9): 5000 [IU] via SUBCUTANEOUS
  Filled 2023-01-18 (×9): qty 1

## 2023-01-18 MED ORDER — INSULIN ASPART 100 UNIT/ML IJ SOLN
0.0000 [IU] | Freq: Three times a day (TID) | INTRAMUSCULAR | Status: DC
  Administered 2023-01-18: 5 [IU] via SUBCUTANEOUS
  Administered 2023-01-18 – 2023-01-19 (×2): 3 [IU] via SUBCUTANEOUS
  Administered 2023-01-19 – 2023-01-20 (×3): 5 [IU] via SUBCUTANEOUS
  Administered 2023-01-20: 2 [IU] via SUBCUTANEOUS
  Administered 2023-01-20: 5 [IU] via SUBCUTANEOUS

## 2023-01-18 MED ORDER — INSULIN ASPART 100 UNIT/ML IJ SOLN
0.0000 [IU] | Freq: Once | INTRAMUSCULAR | Status: AC
  Administered 2023-01-18: 3 [IU] via SUBCUTANEOUS

## 2023-01-18 MED ORDER — INSULIN ASPART 100 UNIT/ML IJ SOLN: 0.0000 [IU] | Freq: Three times a day (TID) | INTRAMUSCULAR | Status: DC

## 2023-01-18 MED ORDER — METRONIDAZOLE 500 MG/100ML IV SOLN
500.0000 mg | Freq: Two times a day (BID) | INTRAVENOUS | Status: DC
  Administered 2023-01-18 (×2): 500 mg via INTRAVENOUS
  Filled 2023-01-18 (×2): qty 100

## 2023-01-18 MED ORDER — INSULIN ASPART 100 UNIT/ML IJ SOLN
0.0000 [IU] | Freq: Every day | INTRAMUSCULAR | Status: DC
  Administered 2023-01-18: 2 [IU] via SUBCUTANEOUS

## 2023-01-18 NOTE — Progress Notes (Addendum)
Attending physician's note   I have taken a history, reviewed the chart, and examined the patient. I performed a substantive portion of this encounter, including complete performance of at least one of the key components, in conjunction with the APP. I agree with the APP's note, impression, and recommendations with my edits.   ERCP completed 11/17/2023 by Dr. Lyndel Safe with small sphincterotomy, sphincteroplasty be, balloon sweeps.  Liver enzymes downtrending, T. bili down to 4.2 (5.1 yesterday).  Clinically feeling better.  PCT downtrending. - Advance diet as tolerated - Complete 5 days of ABX - Inpatient GI service will sign off at this time.  Can follow-up with his primary outpatient GI at [facility].  Please do not hesitate to contact us with additional questions or concerns  Gerrit Heck, DO, FACG [phone] office          Progress Note  Primary GI: [facility]  Chief Complaint: Possible cholangitis  No family was present at the time of my evaluation. Patient lying in bed, no events overnight. Denies abdominal pain, nausea, vomiting, fever or chills.    Objective   Vital signs in last 24 hours: Temp:  [96.5 F (35.8 C)-99.2 F (37.3 C)] 97.9 F (36.6 C) (02/09 0738) Pulse Rate:  [53-107] 55 (02/09 0500) Resp:  [12-20] 12 (02/09 0500) BP: (101-147)/(58-110) 127/73 (02/09 0500) SpO2:  [84 %-97 %] 96 % (02/09 0500) Last BM Date :  (PTA) Last BM recorded by nurses in past 5 days No data recorded  General:   male in no acute distress  Heart:  Regular rate and rhythm; no murmurs Pulm: Clear anteriorly; no wheezing Abdomen:  Soft, Obese AB, Active bowel sounds. No tenderness Extremities:  without  edema. Neurologic:  Alert and  oriented x4;  No focal deficits.  Psych:  Cooperative. Normal mood and affect.  Intake/Output from previous day: 02/08 0701 - 02/09 0700 In: 2530.7 [P.O.:840; I.V.:1252; IV Piggyback:438.7] Out: 690  [Urine:680; Blood:10] Intake/Output this shift: No intake/output data recorded.  Studies/Results: DG ERCP  Result Date: 01/17/2023 CLINICAL DATA:  ERCP for jaundice. History of cholecystectomy with choledocholithiasis demonstrated on preceding MRCP. EXAM: ERCP TECHNIQUE: Multiple spot images obtained with the fluoroscopic device and submitted for interpretation post-procedure. FLUOROSCOPY TIME: FLUOROSCOPY TIME 38 seconds (17.1 mGy) COMPARISON:  MRCP-01/16/2023 FINDINGS: Ten spot intraoperative fluoroscopic images of the right upper abdominal quadrant during ERCP are provided for review Initial image demonstrates an ERCP probe overlying the right upper abdominal quadrant. Subsequent images demonstrate selective cannulation and opacification of the common bile duct which appears mildly dilated. Subsequent images demonstrate insufflation of a balloon within distal aspect of the CBD with subsequent biliary sweeping and presumed sphincterotomy There is minimal opacification of the intrahepatic biliary tree and residual cystic duct. There is no definitive opacification of the pancreatic duct. IMPRESSION: ERCP with biliary sweeping and presumed sphincterotomy as above. These images were submitted for radiologic interpretation only. Please see the procedural report for the amount of contrast and the fluoroscopy time utilized. Electronically Signed   By: Sandi Mariscal M.D.   On: 01/17/2023 15:04   ECHOCARDIOGRAM COMPLETE  Result Date: 01/17/2023    ECHOCARDIOGRAM REPORT   Patient Name:   JACQUELINE HOLBROOK Date of Exam: 01/17/2023 Medical Rec #:  [id number]        Height:       68.0 in Accession #:    [id number]       Weight:  216.7 lb Date of Birth:  1946-02-09        BSA:          2.115 m Patient Age:    77 years         BP:           109/52 mmHg Patient Gender: M                HR:           86 bpm. Exam Location:  Inpatient Procedure: 2D Echo, Cardiac Doppler and Color Doppler Indications:    Elevated Troponin   History:        Patient has no prior history of Echocardiogram examinations. PAD                 and TIA, Arrythmias:Tachycardia; Risk Factors:Hypertension,                 Diabetes and Dyslipidemia.  Sonographer:    Ronny Flurry Referring Phys: [phone] [place]  1. Left ventricular ejection fraction, by estimation, is 60 to 65%. The left ventricle has normal function. The left ventricle has no regional wall motion abnormalities. Left ventricular diastolic parameters are indeterminate.  2. Right ventricular systolic function is normal. The right ventricular size is normal.  3. The mitral valve is normal in structure. No evidence of mitral valve regurgitation. No evidence of mitral stenosis.  4. The aortic valve is tricuspid. Aortic valve regurgitation is not visualized. No aortic stenosis is present.  5. Aortic dilatation noted. There is mild dilatation of the ascending aorta, measuring 40 mm.  6. The inferior vena cava is dilated in size with <50% respiratory variability, suggesting right atrial pressure of 15 mmHg. Comparison(s): No prior Echocardiogram. FINDINGS  Left Ventricle: Left ventricular ejection fraction, by estimation, is 60 to 65%. The left ventricle has normal function. The left ventricle has no regional wall motion abnormalities. The left ventricular internal cavity size was normal in size. There is  no left ventricular hypertrophy. Left ventricular diastolic parameters are indeterminate. Right Ventricle: The right ventricular size is normal. Right ventricular systolic function is normal. Left Atrium: Left atrial size was normal in size. Right Atrium: Right atrial size was normal in size. Pericardium: Trivial pericardial effusion is present. Mitral Valve: The mitral valve is normal in structure. Mild mitral annular calcification. No evidence of mitral valve regurgitation. No evidence of mitral valve stenosis. Tricuspid Valve: The tricuspid valve is normal in structure. Tricuspid  valve regurgitation is mild . No evidence of tricuspid stenosis. Aortic Valve: The aortic valve is tricuspid. Aortic valve regurgitation is not visualized. No aortic stenosis is present. Aortic valve mean gradient measures 3.0 mmHg. Aortic valve peak gradient measures 6.7 mmHg. Aortic valve area, by VTI measures 3.61 cm. Pulmonic Valve: The pulmonic valve was normal in structure. Pulmonic valve regurgitation is mild. No evidence of pulmonic stenosis. Aorta: Aortic dilatation noted. There is mild dilatation of the ascending aorta, measuring 40 mm. Venous: The inferior vena cava is dilated in size with less than 50% respiratory variability, suggesting right atrial pressure of 15 mmHg. IAS/Shunts: No atrial level shunt detected by color flow Doppler.  LEFT VENTRICLE PLAX 2D LVIDd:         4.90 cm   Diastology LVIDs:         3.60 cm   LV e' medial:    6.42 cm/s LV PW:         1.00 cm   LV E/e' medial:  14.5 LV IVS:        1.10 cm   LV e' lateral:   8.05 cm/s LVOT diam:     2.30 cm   LV E/e' lateral: 11.6 LV SV:         90 LV SV Index:   43 LVOT Area:     4.15 cm                           3D Volume EF:                          3D EF:        56 %                          LV EDV:       222 ml                          LV ESV:       98 ml                          LV SV:        124 ml RIGHT VENTRICLE             IVC RV S prime:     16.20 cm/s  IVC diam: 2.70 cm TAPSE (M-mode): 1.8 cm LEFT ATRIUM             Index        RIGHT ATRIUM           Index LA diam:        4.60 cm 2.18 cm/m   RA Area:     19.20 cm LA Vol (A2C):   49.0 ml 23.17 ml/m  RA Volume:   53.70 ml  25.39 ml/m LA Vol (A4C):   56.7 ml 26.81 ml/m LA Biplane Vol: 54.8 ml 25.91 ml/m  AORTIC VALVE AV Area (Vmax):    3.24 cm AV Area (Vmean):   3.45 cm AV Area (VTI):     3.61 cm AV Vmax:           129.00 cm/s AV Vmean:          85.700 cm/s AV VTI:            0.250 m AV Peak Grad:      6.7 mmHg AV Mean Grad:      3.0 mmHg LVOT Vmax:         100.67 cm/s LVOT  Vmean:        71.233 cm/s LVOT VTI:          0.217 m LVOT/AV VTI ratio: 0.87  AORTA Ao Root diam: 3.30 cm Ao Asc diam:  4.00 cm MITRAL VALVE               TRICUSPID VALVE MV Area (PHT): 4.31 cm    TR Peak grad:   30.2 mmHg MV Decel Time: 176 msec    TR Vmax:        275.00 cm/s MV E velocity: 93.00 cm/s MV A velocity: 86.10 cm/s  SHUNTS MV E/A ratio:  1.08        Systemic VTI:  0.22 m  Systemic Diam: 2.30 cm Kirk Ruths MD Electronically signed by Kirk Ruths MD Signature Date/Time: 01/17/2023/12:17:49 PM    Final    DG Chest Port 1 View  Result Date: 01/17/2023 CLINICAL DATA:  Respiratory failure EXAM: PORTABLE CHEST 1 VIEW COMPARISON:  None Available. FINDINGS: Normal mediastinum and cardiac silhouette. Normal pulmonary vasculature. No evidence of effusion, infiltrate, or pneumothorax. No acute bony abnormality. IMPRESSION: No acute cardiopulmonary process. Electronically Signed   By: Suzy Bouchard M.D.   On: 01/17/2023 09:38   MR ABDOMEN MRCP W WO CONTAST  Result Date: 01/16/2023 CLINICAL DATA:  Jaundice EXAM: MRI ABDOMEN WITHOUT AND WITH CONTRAST (INCLUDING MRCP) TECHNIQUE: Multiplanar multisequence MR imaging of the abdomen was performed both before and after the administration of intravenous contrast. Heavily T2-weighted images of the biliary and pancreatic ducts were obtained, and three-dimensional MRCP images were rendered by post processing. CONTRAST:  23m GADAVIST GADOBUTROL 1 MMOL/ML IV SOLN COMPARISON:  Multiple priors including CT and ultrasound dated January 15, 2022. FINDINGS: Despite efforts by the technologist and patient, motion artifact is present on today's exam and could not be eliminated. This reduces exam sensitivity and specificity. Lower chest: Heterogeneous signal in the bilateral lung bases commonly reflects atelectasis. Hepatobiliary: Periportal edema with peribiliary enhancement and increased biliary ductal dilation, the common duct measuring 11 mm  in diameter. There are a few T2 hypointense foci in the distal duct on images 30/3 and 32/4. Heterogeneous enhancement of the hepatic parenchyma with corresponding reduced diffusivity in these areas. Diffuse hepatic steatosis.  Slight nodular hepatic contours. Pancreas:  Increased T2 signal about the pancreatic head. Spleen:  No splenomegaly. Adrenals/Urinary Tract: Bilateral adrenal glands appear normal. Similar left renal atrophy. No hydronephrosis. Stomach/Bowel: Mild wall thickening of the ascending colon. Vascular/Lymphatic: Prominent right upper quadrant lymph nodes for instance measuring 14 mm on image 45/1204. The portal, splenic and superior mesenteric veins are patent. Smooth IVC contours. Aortic atherosclerosis. Other: No walled off fluid collections. Trace perihepatic free fluid. Musculoskeletal: No suspicious bone lesions identified. IMPRESSION: Examination is significantly degraded by respiratory motion limiting sensitivity and specificity. Within this context: 1. Choledocholithiasis with increased biliary ductal dilation, periportal edema and peribiliary enhancement. Additionally there is heterogeneous enhancement of the hepatic parenchyma with associated areas of reduced diffusivity in the hepatic parenchyma. Findings which are highly concerning for choledocholithiasis with ascending cholangitis. 2. Increased T2 signal about the pancreatic head which may reflect mild pancreatitis. 3. Mild wall thickening of the ascending colon may be reactive or reflect colitis. 4. Diffuse hepatic steatosis with slight nodular hepatic contours which may reflect early cirrhosis. 5. Prominent right upper quadrant lymph nodes are nonspecific and may be reactive. 6. Heterogeneous signal in the bilateral lung bases commonly reflects atelectasis. These results will be called to the ordering clinician or representative by the Radiologist Assistant, and communication documented in the PACS or [employer] Electronically  Signed   By: JDahlia BailiffM.D.   On: 01/16/2023 16:41    Lab Results: Recent Labs    01/16/23 0645 01/17/23 0037 01/17/23 1045 01/18/23 0021  WBC 13.7* 10.8*  --  5.7  HGB 13.7 11.1*  --  11.6*  HCT 40.9 33.2*  --  35.0*  PLT 112* 78* 78* 76*   BMET Recent Labs    01/17/23 0037 01/17/23 1045 01/18/23 0021  NA 135 135 131*  K 3.9 4.2 4.8  CL 102 100 98  CO2 22 22 25  $ GLUCOSE 98 111* 220*  BUN 36* 41* 46*  CREATININE 1.64*  1.77* 1.97*  CALCIUM 8.0* 8.2* 8.1*   LFT Recent Labs    01/18/23 0021  PROT 5.7*  ALBUMIN 2.6*  AST 388*  ALT 660*  ALKPHOS 147*  BILITOT 4.2*   PT/INR Recent Labs    01/17/23 0037 01/17/23 1045  LABPROT 20.1* 18.6*  INR 1.7* 1.6*     Scheduled Meds:  ascorbic acid  500 mg Oral Daily   aspirin EC  81 mg Oral Daily   Chlorhexidine Gluconate Cloth  6 each Topical Daily   cholecalciferol  1,000 Units Oral Daily   cyanocobalamin  1,000 mcg Oral Daily   insulin aspart  0-15 Units Subcutaneous TID WC   insulin aspart  0-5 Units Subcutaneous QHS   multivitamin with minerals  1 tablet Oral Daily   pantoprazole  40 mg Oral Daily   Continuous Infusions:  sodium chloride Stopped (01/17/23 1107)   ceFEPime (MAXIPIME) IV 2 g (01/18/23 0826)   metronidazole        Patient profile:   77 yo male transferred from [facility] ED with sepsis / presumed cholangitis. Mild prominence of CBD ( post-cholecystectomy state) and mild increased density within distal common bile duct on CT scan.     Impression/Plan:   Choledocholithiasis  Recent Labs  Lab 01/15/23 1011 01/16/23 0622 01/16/23 1425 01/17/23 0037 01/18/23 0021  AST 678* 324* 611* 807* 388*  ALT 707* 505* 672* 833* 660*  ALKPHOS 149* 151* 119 104 147*  BILITOT 4.4* 5.8* 5.5* 5.1* 4.2*  PROT 7.4 6.2* 5.3* 5.2* 5.7*  ALBUMIN 4.2 3.5 2.8* 2.7* 2.6*   02/08 ERCP with Dr. Lyndel Safe with biliary sphincterotomy/sphincteroplasty and balloon extraction.- No pus was noted. Lipase negative Down  trending LFTs Can advance diet as tolerated ABX for 5 days from GI standpoint, on cefepime, flagyl discontinued [facility] will sign off.  Please contact us if we can be of any further assistance during this hospital stay. Patient can follow up outpatient with [facility]/Primary GI  Elevated troponin Troponin 24-->peaked 4032-->3106 No chest pain, baseline shortness of breath Off heparin, will need outpatient follow up with cardiology  AKI BUN 46 Cr 1.97 ( baseline 1.19) Per primary  Chronic thrombocytopenia 112-->76  Principal Problem:   Septic shock ([place])    LOS: 2 days   Vladimir Crofts  01/18/2023, 8:36 AM

## 2023-01-18 NOTE — Progress Notes (Addendum)
NAME:  Victor Rojas, MRN:  [id number], DOB:  1946-09-30, LOS: 2 ADMISSION DATE:  01/16/2023, CONSULTATION DATE:  01/16/2023 REFERRING MD:  Jori Moll [facility]), CHIEF COMPLAINT:  Septic shock  History of Present Illness:  Victor Rojas is a 77 y.o. male past medical history significant for hypertension, hyperlipidemia, diabetes, and prior cholecystectomy in 2022 who presented to [facility] ED (2/7) with dizziness.  Was seen day prior (2/6) at [facility] ED for abdominal pain and found to have elevation of LFTs and Tbili.  CT scan was concerning for choledocholithiasis.  Recommended ERCP at [facility], however patient left AMA.  Started to feel worse after returning home with new dizziness and lightheadedness.  Upon presentation to [facility] ED again, found to be hypotensive with troponin newly elevated to 2000 from 24 the day prior and lactate 6.1.  EKG with no ischemic changes.  Given 3 L IV fluid.  Started on cefepime and metronidazole as well as norepinephrine.  Discussed with GI at [facility] and transferred to ICU at [facility] pending MRCP and clinical stabilization for possible ERCP.  Troponins continued to trend up to 4000 before trending down to 3100 2/8 with new O2 requirement and brief chest pain overnight 2/7-2/8; cardiology consulted prior to ERCP and who cleared him but will require outpatient follow up.  MRCP showed choledocholithiasis and ascending cholangitis and ERCP performed 2/8 confirming findings and removing obstructing stone. Discontinued metronidazole and continued cefepime per GI.  Pertinent  Medical History  - HTN - HLD - Gout - Cholecystectomy 12/2020 - GERD - T2DM - Hiatal hernia - Diverticulosis - PAD s/p R endarterectomy 12/2018 - Hx colonic polyps  Significant Hospital Events: Including procedures, antibiotic start and stop dates in addition to other pertinent events   2/6 - Seen at [facility] with elevated LFTs and Tbili, CT concerning for choledocholithiasis, left AMA 2/7 - Returned to [facility] with sepsis;  started on cefepime, metronidazole, and norepinephrine; transferred to [facility] and admitted to ICU; MRCP showing choledocholithiasis and ascending cholangitis with possible mild pancreatitis 2/8 - Blood Cx grew Enterobacter; Troponins high, but began trending down, cardiology consulted and cleared for outpt f/u; ERCP performed showing choledocholithiasis w/o pus and stone removed; d/c metronidazole 2/9 - Stable for moving out of ICU to med-surg  Interim History / Subjective:  Patient doing well this morning, happy to eat his clear liquid diet.  No real pain or concern overall, generally getting tired of hospital stay.  Able to wean off O2 and stable on RA today.  Some bradycardia overnight while sleeping, but normal rate this morning.  Highlighted lab trends: AST: 324 > 807 > 388 ALT: 505 > 833 > 660 Tbili: 5.5 > 5.2 > 4.2 sCr: 1.94 > 1.64 > 1.97 Procal: 32.6 > 25.3 > 14 Platelets: 112 > 78 > 76  Objective   Blood pressure 130/63, pulse 75, temperature 97.8 F (36.6 C), temperature source Oral, resp. rate 18, weight 98.3 kg, SpO2 93 %.        Intake/Output Summary (Last 24 hours) at 01/18/2023 1118 Last data filed at 01/18/2023 1000 Gross per 24 hour  Intake 2079.81 ml  Output 840 ml  Net 1239.81 ml   Filed Weights   01/17/23 0500  Weight: 98.3 kg   Examination: General: Obese male sitting up in bed, eating breakfast.  In no acute distress, alert and at baseline. HEENT: MMM, NCAT, mild scleral icterus. Lungs: CTAB, no wheezes, crackles, or rhonchi. Normal WOB on RA. Cardiovascular: Irregular HR, no tachycardia.  Tele shows frequent PVCs and sinus rhythm.  No murmurs/rubs/gallops. Abdomen: No TTP.  Abdomen distended. Normoactive bowel sounds. Left paraumbilical hernia. Extremities: 1+ pitting edema bilaterally.  Well perfused. Skin: Dry, no rashes grossly. Neuro: Alert and oriented x4. No focal deficits, conversing well.  No tremor, asterixis.  New Labs & Imaging  ERCP 2/8: -  Choledocholithiasis was found. Complete removal was accomplished by biliary sphincterotomy/sphincteroplasty and balloon extraction. - No pus was noted.  Echo 2/8: - LVEF 60-65% - Indeterminate LV diastolic parameters - No stenoses or regurgitation  Assessment & Plan:  Septic shock, resolved Choledocholithiasis, s/p ERCP w/ stone removal Cholangitis w/ enterobacter bacteremia, AST/ALT peaked and trending down Patient remains off pressors, procal trending down, now s/p successful ERCP.  No evidence of bleeding and lipase remains WNL.  Per GI, d/c metronidazole and will continue cefepime.  Patient clear to move to med-surg floor from ICU today. - Continuing cefepime (day 3 of 5), resumed metronidazole (day 3) - Watch for pancreatitis, bleeding, perforation, and cholangitis - Trend CBC, CMP, INR - Appreciate GI assistance and ERCP - Oxycodone PRN for pain s/p procedure  AKI, worsening Lactic acidosis, improving AKI likely worsened following recent procedure, encouraging PO hydration.  Now tolerating PO and does not require IV fluids. - f/u urinalysis - Strict I/Os - Daily CMP  Thrombocytopenia Platelets stable at 78, DIC panel without schistocytes and elevated fibrinogen, ruling out DIC.  HTN Now hypertensive again. Hold home lisinopril-HCTZ in setting of hypotension.  Elevated troponin, downtrending Per cardiology, no concern for NSTEMI yesterday, though concern for CAD. - Appreciate cardiology recs, concern for CAD but echo WNL and can d/c heparin, plan for outpt f/u - Continuous cardiac monitoring  Hypokalemia, resolved Hypomagnesemia, resolved K stable, Mg elevated to 2.8 this morning. Still some PVCs on EKG. - Daily CMP, replete as necessary  Alcohol use disorder Last drink over 1 week ago, no need for CIWA precautions at this time.  Elevated MCV on admission. - Thiamine, B12, and vitamin C supplementation - Counseling with PCP outpatient  Hypoxemia, resolved Now off  O2 and stable on RA.  CXR yesterday with no evidence of ARDS or other acute pulmonary process.  T2DM Hgb A1c 6.6 this admission.  Holding metformin in hospital setting. - SSI and Q4 CBGs  Gout Resume home allopurinol as AKI resolves.  HLD Resume atorvastatin as liver function improves.  Best Practice (right click and "Reselect all SmartList Selections" daily)   Diet/type: clear liquids DVT prophylaxis: SCD GI prophylaxis: PPI Lines: N/A Foley:  N/A Code Status:  full code Last date of multidisciplinary goals of care discussion: w/ patient 2/9  Dispo: Med-surg today  King Pinzon, MS4 01/18/2023, 11:18 AM Johnell Comings, PCCM

## 2023-01-18 NOTE — Hospital Course (Signed)
-   consider outpatient PCP counseling for alcohol use disorder

## 2023-01-18 NOTE — Progress Notes (Signed)
Rounding Note    Patient Name: Victor Rojas Date of Encounter: 01/18/2023  [place] Cardiologist: None   Subjective   No CP or SOB today.   Inpatient Medications    Scheduled Meds:  ascorbic acid  500 mg Oral Daily   aspirin EC  81 mg Oral Daily   Chlorhexidine Gluconate Cloth  6 each Topical Daily   cholecalciferol  1,000 Units Oral Daily   cyanocobalamin  1,000 mcg Oral Daily   heparin injection (subcutaneous)  5,000 Units Subcutaneous Q8H   insulin aspart  0-15 Units Subcutaneous TID WC   insulin aspart  0-5 Units Subcutaneous QHS   multivitamin with minerals  1 tablet Oral Daily   pantoprazole  40 mg Oral Daily   Continuous Infusions:  sodium chloride Stopped (01/17/23 1107)   ceFEPime (MAXIPIME) IV Stopped (01/18/23 0856)   metronidazole Stopped (01/18/23 0957)   PRN Meds: lip balm, mouth rinse, oxyCODONE   Vital Signs    Vitals:   01/18/23 1000 01/18/23 1100 01/18/23 1113 01/18/23 1200  BP: 130/63 138/60  (!) 147/94  Pulse: 75 77  74  Resp: 18 16  17  $ Temp:   97.8 F (36.6 C)   TempSrc:   Oral   SpO2: 93% 92%  91%  Weight:        Intake/Output Summary (Last 24 hours) at 01/18/2023 1343 Last data filed at 01/18/2023 1200 Gross per 24 hour  Intake 2139.85 ml  Output 840 ml  Net 1299.85 ml      01/17/2023    5:00 AM 01/16/2023    6:21 AM 03/26/2022   11:14 AM  Last 3 Weights  Weight (lbs) 216 lb 11.4 oz 208 lb 203 lb  Weight (kg) 98.3 kg 94.348 kg 92.08 kg      Telemetry    SR with PVCs - Personally Reviewed  ECG    No new - Personally Reviewed  Physical Exam   GEN: No acute distress.   Neck: No JVD Cardiac: RRR, no murmurs, rubs, or gallops.  Respiratory: Clear to auscultation bilaterally. GI: Soft, nontender, non-distended  MS: No edema; No deformity. Neuro:  Nonfocal  Psych: Normal affect   Labs    High Sensitivity Troponin:   Recent Labs  Lab 01/16/23 0645 01/16/23 1029 01/16/23 1425 01/16/23 1656  01/17/23 0834  TROPONINIHS 2,084* 2,235* 3,925* 4,032* 3,106*     Chemistry Recent Labs  Lab 01/16/23 1425 01/17/23 0037 01/17/23 1045 01/18/23 0021  NA 137 135 135 131*  K 4.3 3.9 4.2 4.8  CL 99 102 100 98  CO2 24 22 22 25  $ GLUCOSE 156* 98 111* 220*  BUN 31* 36* 41* 46*  CREATININE 1.94* 1.64* 1.77* 1.97*  CALCIUM 8.3* 8.0* 8.2* 8.1*  MG  --  1.6* 2.7* 2.8*  PROT 5.3* 5.2*  --  5.7*  ALBUMIN 2.8* 2.7*  --  2.6*  AST 611* 807*  --  388*  ALT 672* 833*  --  660*  ALKPHOS 119 104  --  147*  BILITOT 5.5* 5.1*  --  4.2*  GFRNONAA 35* 43* 39* 35*  ANIONGAP 14 11 13 8    $ Lipids No results for input(s): "CHOL", "TRIG", "HDL", "LABVLDL", "LDLCALC", "CHOLHDL" in the last 168 hours.  Hematology Recent Labs  Lab 01/16/23 0645 01/17/23 0037 01/17/23 1045 01/18/23 0021  WBC 13.7* 10.8*  --  5.7  RBC 3.83* 3.11*  --  3.23*  HGB 13.7 11.1*  --  11.6*  HCT  40.9 33.2*  --  35.0*  MCV 106.8* 106.8*  --  108.4*  MCH 35.8* 35.7*  --  35.9*  MCHC 33.5 33.4  --  33.1  RDW 14.0 14.4  --  13.9  PLT 112* 78* 78* 76*   Thyroid No results for input(s): "TSH", "FREET4" in the last 168 hours.  BNP Recent Labs  Lab 01/17/23 1045  BNP 667.2*    DDimer  Recent Labs  Lab 01/17/23 1045  DDIMER 16.81*     Radiology    DG ERCP  Result Date: 01/17/2023 CLINICAL DATA:  ERCP for jaundice. History of cholecystectomy with choledocholithiasis demonstrated on preceding MRCP. EXAM: ERCP TECHNIQUE: Multiple spot images obtained with the fluoroscopic device and submitted for interpretation post-procedure. FLUOROSCOPY TIME: FLUOROSCOPY TIME 38 seconds (17.1 mGy) COMPARISON:  MRCP-01/16/2023 FINDINGS: Ten spot intraoperative fluoroscopic images of the right upper abdominal quadrant during ERCP are provided for review Initial image demonstrates an ERCP probe overlying the right upper abdominal quadrant. Subsequent images demonstrate selective cannulation and opacification of the common bile duct which  appears mildly dilated. Subsequent images demonstrate insufflation of a balloon within distal aspect of the CBD with subsequent biliary sweeping and presumed sphincterotomy There is minimal opacification of the intrahepatic biliary tree and residual cystic duct. There is no definitive opacification of the pancreatic duct. IMPRESSION: ERCP with biliary sweeping and presumed sphincterotomy as above. These images were submitted for radiologic interpretation only. Please see the procedural report for the amount of contrast and the fluoroscopy time utilized. Electronically Signed   By: Sandi Mariscal M.D.   On: 01/17/2023 15:04   ECHOCARDIOGRAM COMPLETE  Result Date: 01/17/2023    ECHOCARDIOGRAM REPORT   Patient Name:   Victor Rojas Date of Exam: 01/17/2023 Medical Rec #:  [id number]        Height:       68.0 in Accession #:    [id number]       Weight:       216.7 lb Date of Birth:  31-Jul-1946        BSA:          2.115 m Patient Age:    77 years         BP:           109/52 mmHg Patient Gender: M                HR:           86 bpm. Exam Location:  Inpatient Procedure: 2D Echo, Cardiac Doppler and Color Doppler Indications:    Elevated Troponin  History:        Patient has no prior history of Echocardiogram examinations. PAD                 and TIA, Arrythmias:Tachycardia; Risk Factors:Hypertension,                 Diabetes and Dyslipidemia.  Sonographer:    Ronny Flurry Referring Phys: [phone] [place]  1. Left ventricular ejection fraction, by estimation, is 60 to 65%. The left ventricle has normal function. The left ventricle has no regional wall motion abnormalities. Left ventricular diastolic parameters are indeterminate.  2. Right ventricular systolic function is normal. The right ventricular size is normal.  3. The mitral valve is normal in structure. No evidence of mitral valve regurgitation. No evidence of mitral stenosis.  4. The aortic valve is tricuspid. Aortic valve regurgitation is not  visualized. No aortic stenosis is  present.  5. Aortic dilatation noted. There is mild dilatation of the ascending aorta, measuring 40 mm.  6. The inferior vena cava is dilated in size with <50% respiratory variability, suggesting right atrial pressure of 15 mmHg. Comparison(s): No prior Echocardiogram. FINDINGS  Left Ventricle: Left ventricular ejection fraction, by estimation, is 60 to 65%. The left ventricle has normal function. The left ventricle has no regional wall motion abnormalities. The left ventricular internal cavity size was normal in size. There is  no left ventricular hypertrophy. Left ventricular diastolic parameters are indeterminate. Right Ventricle: The right ventricular size is normal. Right ventricular systolic function is normal. Left Atrium: Left atrial size was normal in size. Right Atrium: Right atrial size was normal in size. Pericardium: Trivial pericardial effusion is present. Mitral Valve: The mitral valve is normal in structure. Mild mitral annular calcification. No evidence of mitral valve regurgitation. No evidence of mitral valve stenosis. Tricuspid Valve: The tricuspid valve is normal in structure. Tricuspid valve regurgitation is mild . No evidence of tricuspid stenosis. Aortic Valve: The aortic valve is tricuspid. Aortic valve regurgitation is not visualized. No aortic stenosis is present. Aortic valve mean gradient measures 3.0 mmHg. Aortic valve peak gradient measures 6.7 mmHg. Aortic valve area, by VTI measures 3.61 cm. Pulmonic Valve: The pulmonic valve was normal in structure. Pulmonic valve regurgitation is mild. No evidence of pulmonic stenosis. Aorta: Aortic dilatation noted. There is mild dilatation of the ascending aorta, measuring 40 mm. Venous: The inferior vena cava is dilated in size with less than 50% respiratory variability, suggesting right atrial pressure of 15 mmHg. IAS/Shunts: No atrial level shunt detected by color flow Doppler.  LEFT VENTRICLE PLAX 2D LVIDd:          4.90 cm   Diastology LVIDs:         3.60 cm   LV e' medial:    6.42 cm/s LV PW:         1.00 cm   LV E/e' medial:  14.5 LV IVS:        1.10 cm   LV e' lateral:   8.05 cm/s LVOT diam:     2.30 cm   LV E/e' lateral: 11.6 LV SV:         90 LV SV Index:   43 LVOT Area:     4.15 cm                           3D Volume EF:                          3D EF:        56 %                          LV EDV:       222 ml                          LV ESV:       98 ml                          LV SV:        124 ml RIGHT VENTRICLE             IVC RV S prime:     16.20 cm/s  IVC diam:  2.70 cm TAPSE (M-mode): 1.8 cm LEFT ATRIUM             Index        RIGHT ATRIUM           Index LA diam:        4.60 cm 2.18 cm/m   RA Area:     19.20 cm LA Vol (A2C):   49.0 ml 23.17 ml/m  RA Volume:   53.70 ml  25.39 ml/m LA Vol (A4C):   56.7 ml 26.81 ml/m LA Biplane Vol: 54.8 ml 25.91 ml/m  AORTIC VALVE AV Area (Vmax):    3.24 cm AV Area (Vmean):   3.45 cm AV Area (VTI):     3.61 cm AV Vmax:           129.00 cm/s AV Vmean:          85.700 cm/s AV VTI:            0.250 m AV Peak Grad:      6.7 mmHg AV Mean Grad:      3.0 mmHg LVOT Vmax:         100.67 cm/s LVOT Vmean:        71.233 cm/s LVOT VTI:          0.217 m LVOT/AV VTI ratio: 0.87  AORTA Ao Root diam: 3.30 cm Ao Asc diam:  4.00 cm MITRAL VALVE               TRICUSPID VALVE MV Area (PHT): 4.31 cm    TR Peak grad:   30.2 mmHg MV Decel Time: 176 msec    TR Vmax:        275.00 cm/s MV E velocity: 93.00 cm/s MV A velocity: 86.10 cm/s  SHUNTS MV E/A ratio:  1.08        Systemic VTI:  0.22 m                            Systemic Diam: 2.30 cm Kirk Ruths MD Electronically signed by Kirk Ruths MD Signature Date/Time: 01/17/2023/12:17:49 PM    Final    DG Chest Port 1 View  Result Date: 01/17/2023 CLINICAL DATA:  Respiratory failure EXAM: PORTABLE CHEST 1 VIEW COMPARISON:  None Available. FINDINGS: Normal mediastinum and cardiac silhouette. Normal pulmonary vasculature. No evidence of  effusion, infiltrate, or pneumothorax. No acute bony abnormality. IMPRESSION: No acute cardiopulmonary process. Electronically Signed   By: Suzy Bouchard M.D.   On: 01/17/2023 09:38   MR ABDOMEN MRCP W WO CONTAST  Result Date: 01/16/2023 CLINICAL DATA:  Jaundice EXAM: MRI ABDOMEN WITHOUT AND WITH CONTRAST (INCLUDING MRCP) TECHNIQUE: Multiplanar multisequence MR imaging of the abdomen was performed both before and after the administration of intravenous contrast. Heavily T2-weighted images of the biliary and pancreatic ducts were obtained, and three-dimensional MRCP images were rendered by post processing. CONTRAST:  79m GADAVIST GADOBUTROL 1 MMOL/ML IV SOLN COMPARISON:  Multiple priors including CT and ultrasound dated January 15, 2022. FINDINGS: Despite efforts by the technologist and patient, motion artifact is present on today's exam and could not be eliminated. This reduces exam sensitivity and specificity. Lower chest: Heterogeneous signal in the bilateral lung bases commonly reflects atelectasis. Hepatobiliary: Periportal edema with peribiliary enhancement and increased biliary ductal dilation, the common duct measuring 11 mm in diameter. There are a few T2 hypointense foci in the distal duct on images 30/3 and 32/4. Heterogeneous enhancement of the hepatic parenchyma  with corresponding reduced diffusivity in these areas. Diffuse hepatic steatosis.  Slight nodular hepatic contours. Pancreas:  Increased T2 signal about the pancreatic head. Spleen:  No splenomegaly. Adrenals/Urinary Tract: Bilateral adrenal glands appear normal. Similar left renal atrophy. No hydronephrosis. Stomach/Bowel: Mild wall thickening of the ascending colon. Vascular/Lymphatic: Prominent right upper quadrant lymph nodes for instance measuring 14 mm on image 45/1204. The portal, splenic and superior mesenteric veins are patent. Smooth IVC contours. Aortic atherosclerosis. Other: No walled off fluid collections. Trace perihepatic  free fluid. Musculoskeletal: No suspicious bone lesions identified. IMPRESSION: Examination is significantly degraded by respiratory motion limiting sensitivity and specificity. Within this context: 1. Choledocholithiasis with increased biliary ductal dilation, periportal edema and peribiliary enhancement. Additionally there is heterogeneous enhancement of the hepatic parenchyma with associated areas of reduced diffusivity in the hepatic parenchyma. Findings which are highly concerning for choledocholithiasis with ascending cholangitis. 2. Increased T2 signal about the pancreatic head which may reflect mild pancreatitis. 3. Mild wall thickening of the ascending colon may be reactive or reflect colitis. 4. Diffuse hepatic steatosis with slight nodular hepatic contours which may reflect early cirrhosis. 5. Prominent right upper quadrant lymph nodes are nonspecific and may be reactive. 6. Heterogeneous signal in the bilateral lung bases commonly reflects atelectasis. These results will be called to the ordering clinician or representative by the Radiologist Assistant, and communication documented in the PACS or [employer]. Electronically Signed   By: Dahlia Bailiff M.D.   On: 01/16/2023 16:41    Cardiac Studies    Patient Profile     Assessment & Plan    Principal Problem:   Septic shock [facility]) Active Problems:   Acute cholangitis  - clinically looks improved after ERCP yesterday.  - no chest pain.  - Recommend outpatient cardiology follow up with likely need for stress imaging or elective cath. He has coronary artery calcifications in the distal, dominant circumflex artery on CT abdomen, certainly warrants ischemic evaluation, particularly with DOE reported prior to admission.  - agree with ASA, statin when synthetic dysfunction and hepatic dysfunction resolve. - add low dose home BB when able.   Pt will coordinate follow up with Dr. Alveria Apley office.  [place] will sign off.      For questions or updates, please contact [place] Please consult [email or web address] for contact info under        Signed, Elouise Munroe, MD  01/18/2023, 1:43 PM

## 2023-01-19 DIAGNOSIS — A419 Sepsis, unspecified organism: Secondary | ICD-10-CM | POA: Diagnosis not present

## 2023-01-19 DIAGNOSIS — R6521 Severe sepsis with septic shock: Secondary | ICD-10-CM | POA: Diagnosis not present

## 2023-01-19 DIAGNOSIS — K8309 Other cholangitis: Secondary | ICD-10-CM | POA: Diagnosis not present

## 2023-01-19 DIAGNOSIS — N179 Acute kidney failure, unspecified: Secondary | ICD-10-CM

## 2023-01-19 LAB — COMPREHENSIVE METABOLIC PANEL
ALT: 493 U/L — ABNORMAL HIGH (ref 0–44)
AST: 165 U/L — ABNORMAL HIGH (ref 15–41)
Albumin: 2.9 g/dL — ABNORMAL LOW (ref 3.5–5.0)
Alkaline Phosphatase: 218 U/L — ABNORMAL HIGH (ref 38–126)
Anion gap: 12 (ref 5–15)
BUN: 52 mg/dL — ABNORMAL HIGH (ref 8–23)
CO2: 20 mmol/L — ABNORMAL LOW (ref 22–32)
Calcium: 8.6 mg/dL — ABNORMAL LOW (ref 8.9–10.3)
Chloride: 105 mmol/L (ref 98–111)
Creatinine, Ser: 1.71 mg/dL — ABNORMAL HIGH (ref 0.61–1.24)
GFR, Estimated: 41 mL/min — ABNORMAL LOW (ref >60)
Glucose, Bld: 175 mg/dL — ABNORMAL HIGH (ref 70–99)
Potassium: 4.5 mmol/L (ref 3.5–5.1)
Sodium: 137 mmol/L (ref 135–145)
Total Bilirubin: 2.9 mg/dL — ABNORMAL HIGH (ref 0.3–1.2)
Total Protein: 6.3 g/dL — ABNORMAL LOW (ref 6.5–8.1)

## 2023-01-19 LAB — CBC
HCT: 38.9 % — ABNORMAL LOW (ref 39.0–52.0)
Hemoglobin: 13.8 g/dL (ref 13.0–17.0)
MCH: 36 pg — ABNORMAL HIGH (ref 26.0–34.0)
MCHC: 35.5 g/dL (ref 30.0–36.0)
MCV: 101.6 fL — ABNORMAL HIGH (ref 80.0–100.0)
Platelets: 102 10*3/uL — ABNORMAL LOW (ref 150–400)
RBC: 3.83 MIL/uL — ABNORMAL LOW (ref 4.22–5.81)
RDW: 13.5 % (ref 11.5–15.5)
WBC: 12.7 10*3/uL — ABNORMAL HIGH (ref 4.0–10.5)
nRBC: 0 % (ref 0.0–0.2)

## 2023-01-19 LAB — CULTURE, BLOOD (ROUTINE X 2)

## 2023-01-19 LAB — GLUCOSE, CAPILLARY
Glucose-Capillary: 216 mg/dL — ABNORMAL HIGH (ref 70–99)
Glucose-Capillary: 173 mg/dL — ABNORMAL HIGH (ref 70–99)
Glucose-Capillary: 127 mg/dL — ABNORMAL HIGH (ref 70–99)

## 2023-01-19 NOTE — Progress Notes (Signed)
PHARMACY - PHYSICIAN COMMUNICATION CRITICAL VALUE ALERT - BLOOD CULTURE IDENTIFICATION (BCID)  Victor Rojas is an 77 y.o. male who presented to [facility] on 01/16/2023 with a chief complaint of Sepsis  Assessment:  Update on previous result: Verified GNR of Enterobacter cloacae present. Orignally GPR was identified on stain; however, no GPR found on further examination  Name of physician (or Provider) Contacted: Dr. Sloan Leiter  Current antibiotics: Cefepime + Flagyl  Changes to prescribed antibiotics recommended:  Continue cefepime for 7 days to complete therapy for GNR bacteremia. Discontinue metronidazole as this was re-added 2/9 given GPR. Okay without per previous GI consults  Results for orders placed or performed during the hospital encounter of 01/16/23  Blood Culture ID Panel (Reflexed) (Collected: 01/16/2023  7:44 AM)  Result Value Ref Range   Enterococcus faecalis NOT DETECTED NOT DETECTED   Enterococcus Faecium NOT DETECTED NOT DETECTED   Listeria monocytogenes NOT DETECTED NOT DETECTED   Staphylococcus species NOT DETECTED NOT DETECTED   Staphylococcus aureus (BCID) NOT DETECTED NOT DETECTED   Staphylococcus epidermidis NOT DETECTED NOT DETECTED   Staphylococcus lugdunensis NOT DETECTED NOT DETECTED   Streptococcus species NOT DETECTED NOT DETECTED   Streptococcus agalactiae NOT DETECTED NOT DETECTED   Streptococcus pneumoniae NOT DETECTED NOT DETECTED   Streptococcus pyogenes NOT DETECTED NOT DETECTED   A.calcoaceticus-baumannii NOT DETECTED NOT DETECTED   Bacteroides fragilis NOT DETECTED NOT DETECTED   Enterobacterales DETECTED (A) NOT DETECTED   Enterobacter cloacae complex DETECTED (A) NOT DETECTED   Escherichia coli NOT DETECTED NOT DETECTED   Klebsiella aerogenes NOT DETECTED NOT DETECTED   Klebsiella oxytoca NOT DETECTED NOT DETECTED   Klebsiella pneumoniae NOT DETECTED NOT DETECTED   Proteus species NOT DETECTED NOT DETECTED   Salmonella species NOT DETECTED  NOT DETECTED   Serratia marcescens NOT DETECTED NOT DETECTED   Haemophilus influenzae NOT DETECTED NOT DETECTED   Neisseria meningitidis NOT DETECTED NOT DETECTED   Pseudomonas aeruginosa NOT DETECTED NOT DETECTED   Stenotrophomonas maltophilia NOT DETECTED NOT DETECTED   Candida albicans NOT DETECTED NOT DETECTED   Candida auris NOT DETECTED NOT DETECTED   Candida glabrata NOT DETECTED NOT DETECTED   Candida krusei NOT DETECTED NOT DETECTED   Candida parapsilosis NOT DETECTED NOT DETECTED   Candida tropicalis NOT DETECTED NOT DETECTED   Cryptococcus neoformans/gattii NOT DETECTED NOT DETECTED   CTX-M ESBL NOT DETECTED NOT DETECTED   Carbapenem resistance IMP NOT DETECTED NOT DETECTED   Carbapenem resistance KPC NOT DETECTED NOT DETECTED   Carbapenem resistance NDM NOT DETECTED NOT DETECTED   Carbapenem resist OXA 48 LIKE NOT DETECTED NOT DETECTED   Carbapenem resistance VIM NOT DETECTED NOT DETECTED    Merrilee Jansky, PharmD Clinical Pharmacist 01/19/2023  7:54 AM

## 2023-01-19 NOTE — Progress Notes (Addendum)
PROGRESS NOTE        PATIENT DETAILS Name: Victor Rojas Age: 77 y.o. Sex: male Date of Birth: 1946-03-18 Admit Date: 01/16/2023 Admitting Physician Collene Gobble, MD [id number], Chrystie Nose, MD  Brief Summary: Patient is a 76 y.o.  male with history of HTN, HLD, DM-2, prior cholecystectomy-who presented with septic shock in the setting of cholangitis/choledocholithiasis and enterococcal bacteremia.  Significant events: 2/06>> Seen at [facility] w elevated LFTs and Tbili, CT concerning for choledocholithiasis, left AMA 2/07>> Returned to [facility] with sepsis-transferred to [facility] ICU-MRCP showing choledocholithiasis/ascending cholangitis 2/08>> Blood Cx grew Enterobacter; Troponins high, but began trending down, cardiology consulted and cleared for outpt f/u; ERCP performed showing choledocholithiasis w/o pus and stone removed; d/c metronidazole 2/09>> Stable for moving out of ICU to med-surg 2/10>> transferred to [facility]  Significant studies: 2/06>> CT abdomen/pelvis: Choledocholithiasis 2/07>> MRCP: Choledocholithiasis with ascending cholangitis, mild pancreatitis 2/08>> echo: EF 60-65%-no regional wall motion abnormality  Significant microbiology data: 2/07>> blood culture: Enterobacter (sensitivity pending)  Procedures: 2/08>> ERCP  Consults: None  Subjective: Lying comfortably in bed-denies any chest pain or shortness of breath.  Objective: Vitals: Blood pressure 135/88, pulse 95, temperature 98 F (36.7 C), temperature source Oral, resp. rate 18, weight 98.3 kg, SpO2 97 %.   Exam: Gen Exam:Alert awake-not in any distress HEENT:atraumatic, normocephalic Chest: B/L clear to auscultation anteriorly CVS:S1S2 regular Abdomen:soft non tender, non distended Extremities:no edema Neurology: Non focal Skin: no rash  Pertinent Labs/Radiology:    Latest Ref Rng & Units 01/18/2023   12:21 AM 01/17/2023   10:45 AM 01/17/2023   12:37 AM  CBC  WBC 4.0 - 10.5 K/uL  5.7   10.8   Hemoglobin 13.0 - 17.0 g/dL 11.6   11.1   Hematocrit 39.0 - 52.0 % 35.0   33.2   Platelets 150 - 400 K/uL 76  78  78     Lab Results  Component Value Date   NA 131 (L) 01/18/2023   K 4.8 01/18/2023   CL 98 01/18/2023   CO2 25 01/18/2023      Assessment/Plan: Septic shock due to choledocholithiasis/cholangitis with Enterobacter bacteremia Sepsis physiology resolved LFTs downtrending-continue to trend LFTs Continue cefepime Follow final blood culture results Will need at least 7 days of antibiotics given bacteremia.  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  Macrocytic anemia Worsening anemia due to critical illness Given macrocytosis-check B12/folate level  DM-2 (A1c 6.6 on 2/8) CBG stable with SSI Continue to hold metformin  Recent Labs    01/18/23 1111 01/18/23 1720 01/18/23 1957  GLUCAP 201* 195* 205*     HTN BP stabilizing Restart metoprolol Resume amlodipine over the next few days  HLD Resume statin when LFTs allow  GERD PPI  EtOH use Per prior notes-last drink more than 1 week back Out of the window for withdrawal symptoms  Obesity: Estimated body mass index is 32.95 kg/m as calculated from the following:   Height as of an earlier encounter on 01/16/23: 5' 8"$  (1.727 m).   Weight as  of this encounter: 98.3 kg.   Code status:   Code Status: Full Code   DVT Prophylaxis: heparin injection 5,000 Units Start: 01/18/23 1400 Place and maintain sequential compression device Start:  01/16/23 1511   Family Communication: None at bedside   Disposition Plan: Status is: Inpatient Remains inpatient appropriate because: Severity of illness   Planned Discharge Destination:Home   Diet: Diet Order             Diet full liquid Room service appropriate? Yes; Fluid consistency: Thin  Diet effective now                     Antimicrobial agents: Anti-infectives (From admission, onward)    Start     Dose/Rate Route Frequency Ordered Stop   01/18/23 0900  metroNIDAZOLE (FLAGYL) IVPB 500 mg        500 mg 100 mL/hr over 60 Minutes Intravenous Every 12 hours 01/18/23 0811 01/22/23 2359   01/16/23 2000  metroNIDAZOLE (FLAGYL) IVPB 500 mg  Status:  Discontinued        500 mg 100 mL/hr over 60 Minutes Intravenous Every 12 hours 01/16/23 1326 01/17/23 1519   01/16/23 2000  ceFEPIme (MAXIPIME) 2 g in sodium chloride 0.9 % 100 mL IVPB        2 g 200 mL/hr over 30 Minutes Intravenous Every 12 hours 01/16/23 1334 01/22/23 2359        MEDICATIONS: Scheduled Meds:  ascorbic acid  500 mg Oral Daily   aspirin EC  81 mg Oral Daily   Chlorhexidine Gluconate Cloth  6 each Topical Daily   cholecalciferol  1,000 Units Oral Daily   cyanocobalamin  1,000 mcg Oral Daily   heparin injection (subcutaneous)  5,000 Units Subcutaneous Q8H   insulin aspart  0-15 Units Subcutaneous TID WC   insulin aspart  0-5 Units Subcutaneous QHS   multivitamin with minerals  1 tablet Oral Daily   pantoprazole  40 mg Oral Daily   Continuous Infusions:  sodium chloride Stopped (01/17/23 1107)   ceFEPime (MAXIPIME) IV Stopped (01/18/23 1947)   metronidazole Stopped (01/18/23 2154)   PRN Meds:.lip balm, mouth rinse, oxyCODONE   I have personally reviewed following labs and imaging studies  LABORATORY DATA: CBC: Recent Labs  Lab 01/15/23 1011 01/16/23 0645 01/17/23 0037 01/17/23 1045 01/18/23 0021  WBC 11.3* 13.7* 10.8*  --  5.7  NEUTROABS  --  12.8*  --   --   --   HGB 14.7 13.7  11.1*  --  11.6*  HCT 43.6 40.9 33.2*  --  35.0*  MCV 104.8* 106.8* 106.8*  --  108.4*  PLT 144* 112* 78* 78* 76*    Basic Metabolic Panel: Recent Labs  Lab 01/16/23 0622 01/16/23 1425 01/17/23 0037 01/17/23 1045 01/18/23 0021  NA 136 137 135 135 131*  K 3.2* 4.3 3.9 4.2 4.8  CL 102 99 102 100 98  CO2 18* 24 22 22 25  $ GLUCOSE 123* 156* 98 111* 220*  BUN 35* 31* 36* 41* 46*  CREATININE 2.03* 1.94* 1.64* 1.77* 1.97*  CALCIUM 8.7* 8.3* 8.0* 8.2* 8.1*  MG  --   --  1.6* 2.7* 2.8*  PHOS  --   --  3.4  --  3.1    GFR: Estimated Creatinine Clearance: 36.3 mL/min (A) (by C-G formula based on SCr of 1.97 mg/dL (H)).  Liver Function Tests: Recent Labs  Lab 01/15/23 1011 01/16/23 0622 01/16/23 1425 01/17/23 0037 01/18/23 0021  AST 678*  324* 611* 807* 388*  ALT 707* 505* 672* 833* 660*  ALKPHOS 149* 151* 119 104 147*  BILITOT 4.4* 5.8* 5.5* 5.1* 4.2*  PROT 7.4 6.2* 5.3* 5.2* 5.7*  ALBUMIN 4.2 3.5 2.8* 2.7* 2.6*   Recent Labs  Lab 01/16/23 0622 01/16/23 1656 01/17/23 0037 01/17/23 1045 01/18/23 0021  LIPASE 25 21 20 22 26   $ No results for input(s): "AMMONIA" in the last 168 hours.  Coagulation Profile: Recent Labs  Lab 01/16/23 0734 01/16/23 1425 01/17/23 0037 01/17/23 1045  INR 1.5* 1.7* 1.7* 1.6*    Cardiac Enzymes: No results for input(s): "CKTOTAL", "CKMB", "CKMBINDEX", "TROPONINI" in the last 168 hours.  BNP (last 3 results) No results for input(s): "PROBNP" in the last 8760 hours.  Lipid Profile: No results for input(s): "CHOL", "HDL", "LDLCALC", "TRIG", "CHOLHDL", "LDLDIRECT" in the last 72 hours.  Thyroid Function Tests: No results for input(s): "TSH", "T4TOTAL", "FREET4", "T3FREE", "THYROIDAB" in the last 72 hours.  Anemia Panel: No results for input(s): "VITAMINB12", "FOLATE", "FERRITIN", "TIBC", "IRON", "RETICCTPCT" in the last 72 hours.  Urine analysis:    Component Value Date/Time   COLORURINE AMBER (A) 01/15/2023 1011   APPEARANCEUR  CLEAR (A) 01/15/2023 1011   LABSPEC 1.015 01/15/2023 1011   PHURINE 5.0 01/15/2023 1011   GLUCOSEU NEGATIVE 01/15/2023 1011   HGBUR NEGATIVE 01/15/2023 [place] 01/15/2023 1011   KETONESUR NEGATIVE 01/15/2023 1011   PROTEINUR NEGATIVE 01/15/2023 1011   NITRITE NEGATIVE 01/15/2023 1011   LEUKOCYTESUR NEGATIVE 01/15/2023 1011    Sepsis Labs: Lactic Acid, Venous    Component Value Date/Time   LATICACIDVEN 4.3 (HH) 01/16/2023 1656    MICROBIOLOGY: Recent Results (from the past 240 hour(s))  Resp panel by RT-PCR (RSV, Flu A&B, Covid) Anterior Nasal Swab     Status: None   Collection Time: 01/16/23  7:34 AM   Specimen: Anterior Nasal Swab  Result Value Ref Range Status   SARS Coronavirus 2 by RT PCR NEGATIVE NEGATIVE Final    Comment: (NOTE) SARS-CoV-2 target nucleic acids are NOT DETECTED.  The SARS-CoV-2 RNA is generally detectable in upper respiratory specimens during the acute phase of infection. The lowest concentration of SARS-CoV-2 viral copies this assay can detect is 138 copies/mL. A negative result does not preclude SARS-Cov-2 infection and should not be used as the sole basis for treatment or other patient management decisions. A negative result may occur with  improper specimen collection/handling, submission of specimen other than nasopharyngeal swab, presence of viral mutation(s) within the areas targeted by this assay, and inadequate number of viral copies(<138 copies/mL). A negative result must be combined with clinical observations, patient history, and epidemiological information. The expected result is Negative.  Fact Sheet for Patients:  EntrepreneurPulse.com.au  Fact Sheet for Healthcare Providers:  IncredibleEmployment.be  This test is no t yet approved or cleared by the [place] FDA and  has been authorized for detection and/or diagnosis of SARS-CoV-2 by FDA under an Emergency Use  Authorization (EUA). This EUA will remain  in effect (meaning this test can be used) for the duration of the COVID-19 declaration under Section 564(b)(1) of the Act, 21 U.S.C.section 360bbb-3(b)(1), unless the authorization is terminated  or revoked sooner.       Influenza A by PCR NEGATIVE NEGATIVE Final   Influenza B by PCR NEGATIVE NEGATIVE Final    Comment: (NOTE) The Xpert Xpress SARS-CoV-2/FLU/RSV plus assay is intended as an aid in the diagnosis of influenza from Nasopharyngeal swab specimens and should not  be used as a sole basis for treatment. Nasal washings and aspirates are unacceptable for Xpert Xpress SARS-CoV-2/FLU/RSV testing.  Fact Sheet for Patients: EntrepreneurPulse.com.au  Fact Sheet for Healthcare Providers: IncredibleEmployment.be  This test is not yet approved or cleared by the [place] FDA and has been authorized for detection and/or diagnosis of SARS-CoV-2 by FDA under an Emergency Use Authorization (EUA). This EUA will remain in effect (meaning this test can be used) for the duration of the COVID-19 declaration under Section 564(b)(1) of the Act, 21 U.S.C. section 360bbb-3(b)(1), unless the authorization is terminated or revoked.     Resp Syncytial Virus by PCR NEGATIVE NEGATIVE Final    Comment: (NOTE) Fact Sheet for Patients: EntrepreneurPulse.com.au  Fact Sheet for Healthcare Providers: IncredibleEmployment.be  This test is not yet approved or cleared by the [place] FDA and has been authorized for detection and/or diagnosis of SARS-CoV-2 by FDA under an Emergency Use Authorization (EUA). This EUA will remain in effect (meaning this test can be used) for the duration of the COVID-19 declaration under Section 564(b)(1) of the Act, 21 U.S.C. section 360bbb-3(b)(1), unless the authorization is terminated or revoked.  Performed at [facility], [address], [place], [place] [address]   Blood Culture (routine x 2)     Status: Abnormal (Preliminary result)   Collection Time: 01/16/23  7:44 AM   Specimen: BLOOD  Result Value Ref Range Status   Specimen Description   Final    BLOOD  RIGHT ARM  Performed at [facility], [address], [place], [place] [address]    Special Requests   Final    BOTTLES DRAWN AEROBIC AND ANAEROBIC Blood Culture results may not be optimal due to an excessive volume of blood received in culture bottles Performed at [facility], [address] [place], [place] [address]    Culture  Setup Time   Final    Organism ID to follow [place] AND ANAEROBIC BOTTLES CRITICAL RESULT CALLED TO, READ BACK BY AND VERIFIED WITH: BRITTANY JOHNSON@0000$  01/17/23 RH Performed at [facility], [address] [place], [place] [address]    Culture (A)  Final    ENTEROBACTER SPECIES IDENTIFICATION AND SUSCEPTIBILITIES TO FOLLOW Performed at [facility], [place] [address], [place], [place] [address]    Report Status PENDING  Incomplete  Blood Culture (routine x 2)     Status: None (Preliminary result)   Collection Time: 01/16/23  7:44 AM   Specimen: BLOOD RIGHT ARM  Result Value Ref Range Status   Specimen Description   Final    BLOOD RIGHT ARM Performed at [facility], [place] [address] [place], [place] [address]    Special Requests   Final    BOTTLES DRAWN AEROBIC AND ANAEROBIC Blood Culture results may not be optimal due to an excessive volume of blood received in culture bottles   Culture  Setup Time   Final    GRAM NEGATIVE RODS GRAM POSITIVE RODS IN BOTH AEROBIC AND ANAEROBIC BOTTLES CRITICAL VALUE NOTED.  VALUE IS CONSISTENT WITH PREVIOUSLY REPORTED AND CALLED VALUE. Performed at [facility], [address] [place], [place] [address]    Culture Lonell Grandchild NEGATIVE RODS [facility] POSITIVE RODS   Final   Report Status PENDING  Incomplete  Blood  Culture ID Panel (Reflexed)     Status: Abnormal   Collection Time: 01/16/23  7:44 AM  Result Value Ref Range Status   Enterococcus  faecalis NOT DETECTED NOT DETECTED Final   Enterococcus Faecium NOT DETECTED NOT DETECTED Final   Listeria monocytogenes NOT DETECTED NOT DETECTED Final   Staphylococcus species NOT DETECTED NOT DETECTED Final   Staphylococcus aureus (BCID) NOT DETECTED NOT DETECTED Final   Staphylococcus epidermidis NOT DETECTED NOT DETECTED Final   Staphylococcus lugdunensis NOT DETECTED NOT DETECTED Final   Streptococcus species NOT DETECTED NOT DETECTED Final   Streptococcus agalactiae NOT DETECTED NOT DETECTED Final   Streptococcus pneumoniae NOT DETECTED NOT DETECTED Final   Streptococcus pyogenes NOT DETECTED NOT DETECTED Final   A.calcoaceticus-baumannii NOT DETECTED NOT DETECTED Final   Bacteroides fragilis NOT DETECTED NOT DETECTED Final   Enterobacterales DETECTED (A) NOT DETECTED Final    Comment: Enterobacterales represent a large order of gram negative bacteria, not a single organism. CRITICAL RESULT CALLED TO, READ BACK BY AND VERIFIED WITH: BRITTANY JOHNSON@0000$  01/17/23 RH    Enterobacter cloacae complex DETECTED (A) NOT DETECTED Final    Comment: CRITICAL RESULT CALLED TO, READ BACK BY AND VERIFIED WITH: BRITTANY JOHNSON@0000$  01/17/23 RH    Escherichia coli NOT DETECTED NOT DETECTED Final   Klebsiella aerogenes NOT DETECTED NOT DETECTED Final   Klebsiella oxytoca NOT DETECTED NOT DETECTED Final   Klebsiella pneumoniae NOT DETECTED NOT DETECTED Final   Proteus species NOT DETECTED NOT DETECTED Final   Salmonella species NOT DETECTED NOT DETECTED Final   Serratia marcescens NOT DETECTED NOT DETECTED Final   Haemophilus influenzae NOT DETECTED NOT DETECTED Final   Neisseria meningitidis NOT DETECTED NOT DETECTED Final   Pseudomonas aeruginosa NOT DETECTED NOT DETECTED Final   Stenotrophomonas maltophilia NOT DETECTED NOT DETECTED Final   Candida albicans  NOT DETECTED NOT DETECTED Final   Candida auris NOT DETECTED NOT DETECTED Final   Candida glabrata NOT DETECTED NOT DETECTED Final   Candida krusei NOT DETECTED NOT DETECTED Final   Candida parapsilosis NOT DETECTED NOT DETECTED Final   Candida tropicalis NOT DETECTED NOT DETECTED Final   Cryptococcus neoformans/gattii NOT DETECTED NOT DETECTED Final   CTX-M ESBL NOT DETECTED NOT DETECTED Final   Carbapenem resistance IMP NOT DETECTED NOT DETECTED Final   Carbapenem resistance KPC NOT DETECTED NOT DETECTED Final   Carbapenem resistance NDM NOT DETECTED NOT DETECTED Final   Carbapenem resist OXA 48 LIKE NOT DETECTED NOT DETECTED Final   Carbapenem resistance VIM NOT DETECTED NOT DETECTED Final    Comment: Performed at [facility], [address] [place], [place] [address]  MRSA Next Gen by PCR, Nasal     Status: None   Collection Time: 01/16/23 12:52 PM   Specimen: Nasal Mucosa; Nasal Swab  Result Value Ref Range Status   MRSA by PCR Next Gen NOT DETECTED NOT DETECTED Final    Comment: (NOTE) The GeneXpert MRSA Assay (FDA approved for NASAL specimens only), is one component of a comprehensive MRSA colonization surveillance program. It is not intended to diagnose MRSA infection nor to guide or monitor treatment for MRSA infections. Test performance is not FDA approved in patients less than 76 years old. Performed at [facility], [place] [address], [place], [place] [address]     RADIOLOGY STUDIES/RESULTS: DG ERCP  Result Date: 01/17/2023 CLINICAL DATA:  ERCP for jaundice. History of cholecystectomy with choledocholithiasis demonstrated on preceding MRCP. EXAM: ERCP TECHNIQUE: Multiple spot images obtained with the fluoroscopic device and submitted for interpretation post-procedure. FLUOROSCOPY TIME: FLUOROSCOPY TIME 38 seconds (17.1 mGy) COMPARISON:  MRCP-01/16/2023 FINDINGS: Ten spot intraoperative fluoroscopic images of the right upper  abdominal quadrant during ERCP are  provided for review Initial image demonstrates an ERCP probe overlying the right upper abdominal quadrant. Subsequent images demonstrate selective cannulation and opacification of the common bile duct which appears mildly dilated. Subsequent images demonstrate insufflation of a balloon within distal aspect of the CBD with subsequent biliary sweeping and presumed sphincterotomy There is minimal opacification of the intrahepatic biliary tree and residual cystic duct. There is no definitive opacification of the pancreatic duct. IMPRESSION: ERCP with biliary sweeping and presumed sphincterotomy as above. These images were submitted for radiologic interpretation only. Please see the procedural report for the amount of contrast and the fluoroscopy time utilized. Electronically Signed   By: Sandi Mariscal M.D.   On: 01/17/2023 15:04   ECHOCARDIOGRAM COMPLETE  Result Date: 01/17/2023    ECHOCARDIOGRAM REPORT   Patient Name:   ANDRE VALLEY Date of Exam: 01/17/2023 Medical Rec #:  [id number]        Height:       68.0 in Accession #:    [id number]       Weight:       216.7 lb Date of Birth:  November 04, 1946        BSA:          2.115 m Patient Age:    57 years         BP:           109/52 mmHg Patient Gender: M                HR:           86 bpm. Exam Location:  Inpatient Procedure: 2D Echo, Cardiac Doppler and Color Doppler Indications:    Elevated Troponin  History:        Patient has no prior history of Echocardiogram examinations. PAD                 and TIA, Arrythmias:Tachycardia; Risk Factors:Hypertension,                 Diabetes and Dyslipidemia.  Sonographer:    Ronny Flurry Referring Phys: [phone] [place]  1. Left ventricular ejection fraction, by estimation, is 60 to 65%. The left ventricle has normal function. The left ventricle has no regional wall motion abnormalities. Left ventricular diastolic parameters are indeterminate.  2. Right ventricular systolic function is normal. The right  ventricular size is normal.  3. The mitral valve is normal in structure. No evidence of mitral valve regurgitation. No evidence of mitral stenosis.  4. The aortic valve is tricuspid. Aortic valve regurgitation is not visualized. No aortic stenosis is present.  5. Aortic dilatation noted. There is mild dilatation of the ascending aorta, measuring 40 mm.  6. The inferior vena cava is dilated in size with <50% respiratory variability, suggesting right atrial pressure of 15 mmHg. Comparison(s): No prior Echocardiogram. FINDINGS  Left Ventricle: Left ventricular ejection fraction, by estimation, is 60 to 65%. The left ventricle has normal function. The left ventricle has no regional wall motion abnormalities. The left ventricular internal cavity size was normal in size. There is  no left ventricular hypertrophy. Left ventricular diastolic parameters are indeterminate. Right Ventricle: The right ventricular size is normal. Right ventricular systolic function is normal. Left Atrium: Left atrial size was normal in size. Right Atrium: Right atrial size was normal in size. Pericardium: Trivial pericardial effusion is present. Mitral Valve: The mitral valve is normal in structure. Mild mitral annular calcification. No evidence of mitral  valve regurgitation. No evidence of mitral valve stenosis. Tricuspid Valve: The tricuspid valve is normal in structure. Tricuspid valve regurgitation is mild . No evidence of tricuspid stenosis. Aortic Valve: The aortic valve is tricuspid. Aortic valve regurgitation is not visualized. No aortic stenosis is present. Aortic valve mean gradient measures 3.0 mmHg. Aortic valve peak gradient measures 6.7 mmHg. Aortic valve area, by VTI measures 3.61 cm. Pulmonic Valve: The pulmonic valve was normal in structure. Pulmonic valve regurgitation is mild. No evidence of pulmonic stenosis. Aorta: Aortic dilatation noted. There is mild dilatation of the ascending aorta, measuring 40 mm. Venous: The  inferior vena cava is dilated in size with less than 50% respiratory variability, suggesting right atrial pressure of 15 mmHg. IAS/Shunts: No atrial level shunt detected by color flow Doppler.  LEFT VENTRICLE PLAX 2D LVIDd:         4.90 cm   Diastology LVIDs:         3.60 cm   LV e' medial:    6.42 cm/s LV PW:         1.00 cm   LV E/e' medial:  14.5 LV IVS:        1.10 cm   LV e' lateral:   8.05 cm/s LVOT diam:     2.30 cm   LV E/e' lateral: 11.6 LV SV:         90 LV SV Index:   43 LVOT Area:     4.15 cm                           3D Volume EF:                          3D EF:        56 %                          LV EDV:       222 ml                          LV ESV:       98 ml                          LV SV:        124 ml RIGHT VENTRICLE             IVC RV S prime:     16.20 cm/s  IVC diam: 2.70 cm TAPSE (M-mode): 1.8 cm LEFT ATRIUM             Index        RIGHT ATRIUM           Index LA diam:        4.60 cm 2.18 cm/m   RA Area:     19.20 cm LA Vol (A2C):   49.0 ml 23.17 ml/m  RA Volume:   53.70 ml  25.39 ml/m LA Vol (A4C):   56.7 ml 26.81 ml/m LA Biplane Vol: 54.8 ml 25.91 ml/m  AORTIC VALVE AV Area (Vmax):    3.24 cm AV Area (Vmean):   3.45 cm AV Area (VTI):     3.61 cm AV Vmax:           129.00 cm/s AV Vmean:  85.700 cm/s AV VTI:            0.250 m AV Peak Grad:      6.7 mmHg AV Mean Grad:      3.0 mmHg LVOT Vmax:         100.67 cm/s LVOT Vmean:        71.233 cm/s LVOT VTI:          0.217 m LVOT/AV VTI ratio: 0.87  AORTA Ao Root diam: 3.30 cm Ao Asc diam:  4.00 cm MITRAL VALVE               TRICUSPID VALVE MV Area (PHT): 4.31 cm    TR Peak grad:   30.2 mmHg MV Decel Time: 176 msec    TR Vmax:        275.00 cm/s MV E velocity: 93.00 cm/s MV A velocity: 86.10 cm/s  SHUNTS MV E/A ratio:  1.08        Systemic VTI:  0.22 m                            Systemic Diam: 2.30 cm Kirk Ruths MD Electronically signed by Kirk Ruths MD Signature Date/Time: 01/17/2023/12:17:49 PM    Final    DG Chest  Port 1 View  Result Date: 01/17/2023 CLINICAL DATA:  Respiratory failure EXAM: PORTABLE CHEST 1 VIEW COMPARISON:  None Available. FINDINGS: Normal mediastinum and cardiac silhouette. Normal pulmonary vasculature. No evidence of effusion, infiltrate, or pneumothorax. No acute bony abnormality. IMPRESSION: No acute cardiopulmonary process. Electronically Signed   By: Suzy Bouchard M.D.   On: 01/17/2023 09:38     LOS: 3 days   Oren Binet, MD  [facility]    To contact the attending provider between 7A-7P or the covering provider during after hours 7P-7A, please log into the web site [email or web address] and access using universal [place] password for that web site. If you do not have the password, please call the hospital operator.  01/19/2023, 6:56 AM

## 2023-01-20 DIAGNOSIS — N179 Acute kidney failure, unspecified: Secondary | ICD-10-CM | POA: Diagnosis not present

## 2023-01-20 DIAGNOSIS — K8309 Other cholangitis: Secondary | ICD-10-CM | POA: Diagnosis not present

## 2023-01-20 DIAGNOSIS — E669 Obesity, unspecified: Secondary | ICD-10-CM

## 2023-01-20 DIAGNOSIS — D696 Thrombocytopenia, unspecified: Secondary | ICD-10-CM

## 2023-01-20 DIAGNOSIS — I10 Essential (primary) hypertension: Secondary | ICD-10-CM

## 2023-01-20 DIAGNOSIS — B9689 Other specified bacterial agents as the cause of diseases classified elsewhere: Secondary | ICD-10-CM | POA: Insufficient documentation

## 2023-01-20 DIAGNOSIS — R748 Abnormal levels of other serum enzymes: Secondary | ICD-10-CM

## 2023-01-20 DIAGNOSIS — A419 Sepsis, unspecified organism: Secondary | ICD-10-CM | POA: Diagnosis not present

## 2023-01-20 DIAGNOSIS — E1165 Type 2 diabetes mellitus with hyperglycemia: Secondary | ICD-10-CM

## 2023-01-20 LAB — VITAMIN B12: Vitamin B-12: 3770 pg/mL — ABNORMAL HIGH (ref 180–914)

## 2023-01-20 LAB — GLUCOSE, CAPILLARY
Glucose-Capillary: 137 mg/dL — ABNORMAL HIGH (ref 70–99)
Glucose-Capillary: 237 mg/dL — ABNORMAL HIGH (ref 70–99)
Glucose-Capillary: 235 mg/dL — ABNORMAL HIGH (ref 70–99)
Glucose-Capillary: 170 mg/dL — ABNORMAL HIGH (ref 70–99)

## 2023-01-20 LAB — FOLATE: Folate: 20.3 ng/mL (ref >5.9)

## 2023-01-20 MED ORDER — CARVEDILOL 6.25 MG PO TABS: 6.2500 mg | ORAL_TABLET | Freq: Two times a day (BID) | ORAL | Status: DC

## 2023-01-20 MED ORDER — ENSURE ENLIVE PO LIQD
237.0000 mL | ORAL | Status: DC
  Administered 2023-01-20: 237 mL via ORAL

## 2023-01-20 MED ORDER — METOPROLOL TARTRATE 25 MG PO TABS
25.0000 mg | ORAL_TABLET | Freq: Two times a day (BID) | ORAL | Status: DC
  Administered 2023-01-20 – 2023-01-21 (×3): 25 mg via ORAL
  Filled 2023-01-20 (×4): qty 1

## 2023-01-20 MED ORDER — AMLODIPINE BESYLATE 5 MG PO TABS
2.5000 mg | ORAL_TABLET | Freq: Every day | ORAL | Status: DC
  Administered 2023-01-20 – 2023-01-21 (×2): 2.5 mg via ORAL
  Filled 2023-01-20 (×2): qty 1

## 2023-01-20 NOTE — Discharge Instructions (Signed)

## 2023-01-20 NOTE — Progress Notes (Signed)
Initial Nutrition Assessment RD working remotely.   DOCUMENTATION CODES:   Not applicable  INTERVENTION:  - serve patient double protein portions (entered into [employer]).  - ordered Ensure Plus High Protein once/day, each supplement provides 350 kcal and 20 grams of protein.  - entered Heart Healthy, Consistent Carbohydrate Nutrition Therapy handout from the Academy of [employer] Dietetics into the AVS.   NUTRITION DIAGNOSIS:   Increased nutrient needs related to acute illness as evidenced by estimated needs.  GOAL:   Patient will meet greater than or equal to 90% of their needs  MONITOR:   PO intake, Labs, Weight trends  REASON FOR ASSESSMENT:   Malnutrition Screening Tool  ASSESSMENT:   77 y.o. male with medical history of HTN, HLD, type 2 DM, hiatal hernia, PVD, arthritis, GERD, adenoma of colon, diverticulosis, and prior cholecystectomy. He presented to the ED and was found to be in septic shock in the setting of cholangitis/choledocholithiasis and enterococcal bacteremia.  Patient ate 100% of dinner on 2/8 and 100% of breakfast on 2/9; no other meal completion percentages documented in the flow sheet. Diet advanced to CLD on 2/8 at 52, to [place] on 2/9 at 1207, and to Heart Healthy/Carb Modified today at 0843.   Patient is unsure if he has lost weight recently. He has had a decreased appetite related to abdominal pain for at least a week.   He has not been assessed by a [place] RD at any time in the past.  Weight on 2/8 was 217 lb, weight on 2/7 was 207 lb, and prior to that the most recently documented weight was 203 lb on 03/26/22. Generalized mild pitting edema documented in the edema section of flow sheet.  Per notes: - patient seen at [facility] on 2/6 but left AMA before returning on 2/7; he was then transferred to [facility] for ICU level care - s/p MRCP on 2/7 which showed choledocholithiasis and ascending cholangitis - s/p ERCP on 2/8 which showed  choledocholithiasis and stone was removed - on 2/9 he transferred out of ICU - sepsis physiology has now resolved - AKI - non-STEMI - alcohol use with last drink being >1 week PTA   Labs reviewed; CBGs: 137 and 237 mg/dl, BUN: 52 mg/dl, creatinine: 1.71 mg/dl, Ca: 8.6 mg/dl, Alk Phos elevated, LFTs elevated, GFR: 41 ml/min.  Medications reviewed; 500 mg ascorbic acid/day, 1000 units cholecalciferol/day, 1000 mcg oral cyanocobalamin/day, sliding scale novolog, 1 tablet multivitamin with minerals/day, 40 mg oral protonix/day.     NUTRITION - FOCUSED PHYSICAL EXAM:  RD working remotely.  Diet Order:   Diet Order             Diet heart healthy/carb modified Room service appropriate? Yes; Fluid consistency: Thin  Diet effective now                   EDUCATION NEEDS:   Education needs have been addressed  Skin:  Skin Assessment: Reviewed RN Assessment  Last BM:  PTA/unknown  Height:   Ht Readings from Last 1 Encounters:  01/16/23 5' 8"$  (1.727 m)    Weight:   Wt Readings from Last 1 Encounters:  01/17/23 98.3 kg    BMI:  Body mass index is 32.95 kg/m.  Estimated Nutritional Needs:  Kcal:  1950-2150 kcal Protein:  95-105 grams Fluid:  >/= 2 L/day     Jarome Matin, MS, RD, LDN, CNSC Clinical Dietitian PRN/Relief staff On-call/weekend pager # available in [facility]

## 2023-01-20 NOTE — Progress Notes (Signed)
PROGRESS NOTE        PATIENT DETAILS Name: Victor Rojas Age: 77 y.o. Sex: male Date of Birth: [birth year] Admit Date: 01/16/2023 Admitting Physician Collene Gobble, MD [id number], Chrystie Nose, MD  Brief Summary: Patient is a 77 y.o.  male with history of HTN, HLD, DM-2, prior cholecystectomy-who presented with septic shock in the setting of cholangitis/choledocholithiasis and enterococcal bacteremia. S/p ERCP.  Remains on IV cefepime until 2/13.  Improving.  Significant events: 2/06>> Seen at [facility] w elevated LFTs and Tbili, CT concerning for choledocholithiasis, left AMA 2/07>> Returned to [facility] with sepsis-transferred to [facility] ICU-MRCP showing choledocholithiasis/ascending cholangitis 2/08>> Blood Cx grew Enterobacter; Troponins high, but began trending down, cardiology consulted and cleared for outpt f/u; ERCP performed showing choledocholithiasis w/o pus and stone removed; d/c metronidazole 2/09>> Stable for moving out of ICU to med-surg 2/10>> transferred to [facility]  Significant studies: 2/06>> CT abdomen/pelvis: Choledocholithiasis 2/07>> MRCP: Choledocholithiasis with ascending cholangitis, mild pancreatitis 2/08>> echo: EF 60-65%-no regional wall motion abnormality  Significant microbiology data: 2/07>> blood culture: Enterobacter (sensitivity pending)  Procedures: 2/08>> ERCP  Consults: None  Subjective: Seen and examined earlier this morning.  No major events overnight of this morning.  No complaints other than brief nausea earlier this morning when he woke up.  No emesis or abdominal pain.  Likes to try solid food.  Objective: Vitals: Blood pressure (!) 167/100, pulse 92, temperature 98.2 F (36.8 C), temperature source Oral, resp. rate 18, weight 98.3 kg, SpO2 96 %.   Exam: GENERAL: No apparent distress.  Nontoxic. HEENT: MMM.  Vision and hearing grossly intact.  NECK: Supple.  No apparent JVD.  RESP:  No IWOB.  Fair aeration  bilaterally. CVS:  RRR. Heart sounds normal.  ABD/GI/GU: BS+. Abd soft, NTND.  MSK/EXT:   No apparent deformity. Moves extremities. No edema.  SKIN: no apparent skin lesion or wound NEURO: Awake and alert. Oriented appropriately.  No apparent focal neuro deficit. PSYCH: Calm. Normal affect.   Assessment/Plan: Septic shock due to choledocholithiasis/cholangitis with Enterobacter bacteremia: Sepsis physiology resolving except for mild leukocytosis.  LFT downtrending. -Continue cefepime until 2/13 to complete a total of 7 days course -Advance diet to heart healthy and carb modified.  AKI: In the setting of septic shock.  No obstruction on CT.  Improving. Recent Labs    03/01/22 2133 03/03/22 0525 03/04/22 0430 01/15/23 1011 01/16/23 0622 01/16/23 1425 01/17/23 0037 01/17/23 1045 01/18/23 0021 01/19/23 0710  BUN 24* 33* 31* 33* 35* 31* 36* 41* 46* 52*  CREATININE 1.06 1.50* 1.38* 1.19 2.03* 1.94* 1.64* 1.77* 1.97* 1.71*  -Monitoring -Avoid nephrotoxic meds  Elevated troponin/demand ischemia: In the setting of sepsis and shock.  Evaluated by cardiology who recommended outpatient follow-up for stress test.  Echo without wall motion abnormality. -Beta-blocker and aspirin. -Lipitor once LFT normalizes  Elevated liver enzyme: Likely due to #1.  Improving. -Continue monitoring  Acute on chronic thrombocytopenia: Likely due to sepsis.  Improving. -Continue monitoring   Controlled NIDDM-2 with hyperglycemia: A1c 6.6%.  On metformin at home. Recent Labs  Lab 01/19/23 1300 01/19/23 1704 01/19/23 2101 01/20/23 0816 01/20/23 1201  GLUCAP 216* 173* 127* 137* 237*  -Continue current insulin regimen -Further adjustment as appropriate  Recent Labs    01/19/23 2101 01/20/23 0816 01/20/23 1201  GLUCAP 127* 137* 237*    Uncontrolled hypertension: BP elevated. -Resume  home metoprolol and amlodipine. -Continue holding lisinopril/HCTZ  HLD Resume statin when LFTs  allow  GERD PPI  EtOH use: Per prior notes-last drink more than 1 week back Out of the window for withdrawal symptoms  Obesity: Estimated body mass index is 32.95 kg/m as calculated from the following:   Height as of an earlier encounter on 01/16/23: 5' 8"$  (1.727 m).   Weight as of this encounter: 98.3 kg.   Code status:   Code Status: Full Code   DVT Prophylaxis: heparin injection 5,000 Units Start: 01/18/23 1400 Place and maintain sequential compression device Start: 01/16/23 1511   Family Communication: None at bedside   Disposition Plan: Status is: Inpatient Remains inpatient appropriate because: Severity of illness   Planned Discharge Destination:Home   Diet: Diet Order             Diet heart healthy/carb modified Room service appropriate? Yes; Fluid consistency: Thin  Diet effective now                     Antimicrobial agents: Anti-infectives (From admission, onward)    Start     Dose/Rate Route Frequency Ordered Stop   01/18/23 0900  metroNIDAZOLE (FLAGYL) IVPB 500 mg  Status:  Discontinued        500 mg 100 mL/hr over 60 Minutes Intravenous Every 12 hours 01/18/23 0811 01/19/23 0757   01/16/23 2000  metroNIDAZOLE (FLAGYL) IVPB 500 mg  Status:  Discontinued        500 mg 100 mL/hr over 60 Minutes Intravenous Every 12 hours 01/16/23 1326 01/17/23 1519   01/16/23 2000  ceFEPIme (MAXIPIME) 2 g in sodium chloride 0.9 % 100 mL IVPB        2 g 200 mL/hr over 30 Minutes Intravenous Every 12 hours 01/16/23 1334 01/22/23 2359        MEDICATIONS: Scheduled Meds:  amLODipine  2.5 mg Oral Daily   ascorbic acid  500 mg Oral Daily   aspirin EC  81 mg Oral Daily   cholecalciferol  1,000 Units Oral Daily   cyanocobalamin  1,000 mcg Oral Daily   feeding supplement  237 mL Oral Q24H   heparin injection (subcutaneous)  5,000 Units Subcutaneous Q8H   insulin aspart  0-15 Units Subcutaneous TID WC   insulin aspart  0-5 Units Subcutaneous QHS   metoprolol  tartrate  25 mg Oral BID   multivitamin with minerals  1 tablet Oral Daily   pantoprazole  40 mg Oral Daily   Continuous Infusions:  sodium chloride Stopped (01/17/23 1107)   ceFEPime (MAXIPIME) IV 2 g (01/20/23 0859)   PRN Meds:.lip balm, mouth rinse, oxyCODONE   I have personally reviewed following labs and imaging studies  LABORATORY DATA: CBC: Recent Labs  Lab 01/15/23 1011 01/16/23 0645 01/17/23 0037 01/17/23 1045 01/18/23 0021 01/19/23 0710  WBC 11.3* 13.7* 10.8*  --  5.7 12.7*  NEUTROABS  --  12.8*  --   --   --   --   HGB 14.7 13.7 11.1*  --  11.6* 13.8  HCT 43.6 40.9 33.2*  --  35.0* 38.9*  MCV 104.8* 106.8* 106.8*  --  108.4* 101.6*  PLT 144* 112* 78* 78* 76* 102*    Basic Metabolic Panel: Recent Labs  Lab 01/16/23 1425 01/17/23 0037 01/17/23 1045 01/18/23 0021 01/19/23 0710  NA 137 135 135 131* 137  K 4.3 3.9 4.2 4.8 4.5  CL 99 102 100 98 105  CO2 24 22 22 $ 25  20*  GLUCOSE 156* 98 111* 220* 175*  BUN 31* 36* 41* 46* 52*  CREATININE 1.94* 1.64* 1.77* 1.97* 1.71*  CALCIUM 8.3* 8.0* 8.2* 8.1* 8.6*  MG  --  1.6* 2.7* 2.8*  --   PHOS  --  3.4  --  3.1  --     GFR: Estimated Creatinine Clearance: 41.8 mL/min (A) (by C-G formula based on SCr of 1.71 mg/dL (H)).  Liver Function Tests: Recent Labs  Lab 01/16/23 0622 01/16/23 1425 01/17/23 0037 01/18/23 0021 01/19/23 0710  AST 324* 611* 807* 388* 165*  ALT 505* 672* 833* 660* 493*  ALKPHOS 151* 119 104 147* 218*  BILITOT 5.8* 5.5* 5.1* 4.2* 2.9*  PROT 6.2* 5.3* 5.2* 5.7* 6.3*  ALBUMIN 3.5 2.8* 2.7* 2.6* 2.9*   Recent Labs  Lab 01/16/23 0622 01/16/23 1656 01/17/23 0037 01/17/23 1045 01/18/23 0021  LIPASE 25 21 20 22 26   $ No results for input(s): "AMMONIA" in the last 168 hours.  Coagulation Profile: Recent Labs  Lab 01/16/23 0734 01/16/23 1425 01/17/23 0037 01/17/23 1045  INR 1.5* 1.7* 1.7* 1.6*    Cardiac Enzymes: No results for input(s): "CKTOTAL", "CKMB", "CKMBINDEX",  "TROPONINI" in the last 168 hours.  BNP (last 3 results) No results for input(s): "PROBNP" in the last 8760 hours.  Lipid Profile: No results for input(s): "CHOL", "HDL", "LDLCALC", "TRIG", "CHOLHDL", "LDLDIRECT" in the last 72 hours.  Thyroid Function Tests: No results for input(s): "TSH", "T4TOTAL", "FREET4", "T3FREE", "THYROIDAB" in the last 72 hours.  Anemia Panel: No results for input(s): "VITAMINB12", "FOLATE", "FERRITIN", "TIBC", "IRON", "RETICCTPCT" in the last 72 hours.  Urine analysis:    Component Value Date/Time   COLORURINE AMBER (A) 01/15/2023 1011   APPEARANCEUR CLEAR (A) 01/15/2023 1011   LABSPEC 1.015 01/15/2023 1011   PHURINE 5.0 01/15/2023 1011   GLUCOSEU NEGATIVE 01/15/2023 1011   HGBUR NEGATIVE 01/15/2023 [place] 01/15/2023 1011   KETONESUR NEGATIVE 01/15/2023 1011   PROTEINUR NEGATIVE 01/15/2023 1011   NITRITE NEGATIVE 01/15/2023 1011   LEUKOCYTESUR NEGATIVE 01/15/2023 1011    Sepsis Labs: Lactic Acid, Venous    Component Value Date/Time   LATICACIDVEN 4.3 (HH) 01/16/2023 1656    MICROBIOLOGY: Recent Results (from the past 240 hour(s))  Resp panel by RT-PCR (RSV, Flu A&B, Covid) Anterior Nasal Swab     Status: None   Collection Time: 01/16/23  7:34 AM   Specimen: Anterior Nasal Swab  Result Value Ref Range Status   SARS Coronavirus 2 by RT PCR NEGATIVE NEGATIVE Final    Comment: (NOTE) SARS-CoV-2 target nucleic acids are NOT DETECTED.  The SARS-CoV-2 RNA is generally detectable in upper respiratory specimens during the acute phase of infection. The lowest concentration of SARS-CoV-2 viral copies this assay can detect is 138 copies/mL. A negative result does not preclude SARS-Cov-2 infection and should not be used as the sole basis for treatment or other patient management decisions. A negative result may occur with  improper specimen collection/handling, submission of specimen other than nasopharyngeal swab, presence of  viral mutation(s) within the areas targeted by this assay, and inadequate number of viral copies(<138 copies/mL). A negative result must be combined with clinical observations, patient history, and epidemiological information. The expected result is Negative.  Fact Sheet for Patients:  EntrepreneurPulse.com.au  Fact Sheet for Healthcare Providers:  IncredibleEmployment.be  This test is no t yet approved or cleared by the [place] FDA and  has been authorized for detection and/or diagnosis of SARS-CoV-2 by FDA  under an Emergency Use Authorization (EUA). This EUA will remain  in effect (meaning this test can be used) for the duration of the COVID-19 declaration under Section 564(b)(1) of the Act, 21 U.S.C.section 360bbb-3(b)(1), unless the authorization is terminated  or revoked sooner.       Influenza A by PCR NEGATIVE NEGATIVE Final   Influenza B by PCR NEGATIVE NEGATIVE Final    Comment: (NOTE) The Xpert Xpress SARS-CoV-2/FLU/RSV plus assay is intended as an aid in the diagnosis of influenza from Nasopharyngeal swab specimens and should not be used as a sole basis for treatment. Nasal washings and aspirates are unacceptable for Xpert Xpress SARS-CoV-2/FLU/RSV testing.  Fact Sheet for Patients: EntrepreneurPulse.com.au  Fact Sheet for Healthcare Providers: IncredibleEmployment.be  This test is not yet approved or cleared by the [place] FDA and has been authorized for detection and/or diagnosis of SARS-CoV-2 by FDA under an Emergency Use Authorization (EUA). This EUA will remain in effect (meaning this test can be used) for the duration of the COVID-19 declaration under Section 564(b)(1) of the Act, 21 U.S.C. section 360bbb-3(b)(1), unless the authorization is terminated or revoked.     Resp Syncytial Virus by PCR NEGATIVE NEGATIVE Final    Comment: (NOTE) Fact Sheet for  Patients: EntrepreneurPulse.com.au  Fact Sheet for Healthcare Providers: IncredibleEmployment.be  This test is not yet approved or cleared by the [place] FDA and has been authorized for detection and/or diagnosis of SARS-CoV-2 by FDA under an Emergency Use Authorization (EUA). This EUA will remain in effect (meaning this test can be used) for the duration of the COVID-19 declaration under Section 564(b)(1) of the Act, 21 U.S.C. section 360bbb-3(b)(1), unless the authorization is terminated or revoked.  Performed at [facility], [address], [place], [place] [address]   Blood Culture (routine x 2)     Status: Abnormal   Collection Time: 01/16/23  7:44 AM   Specimen: BLOOD  Result Value Ref Range Status   Specimen Description   Final    BLOOD  RIGHT ARM  Performed at [facility], [address], [place], [place] [address]    Special Requests   Final    BOTTLES DRAWN AEROBIC AND ANAEROBIC Blood Culture results may not be optimal due to an excessive volume of blood received in culture bottles Performed at [facility] [address] [place], [place] [address]    Culture  Setup Time   Final    GRAM NEGATIVE RODS IN BOTH AEROBIC AND ANAEROBIC BOTTLES CRITICAL RESULT CALLED TO, READ BACK BY AND VERIFIED WITH: BRITTANY JOHNSON@0000$  01/17/23 RH Performed at [facility], [place] [address] [place], [place] [address]    Culture ENTEROBACTER CLOACAE (A)  Final   Report Status 01/19/2023 FINAL  Final   Organism ID, Bacteria ENTEROBACTER CLOACAE  Final      Susceptibility   Enterobacter cloacae - MIC*    CEFAZOLIN >=64 RESISTANT Resistant     CEFEPIME <=0.12 SENSITIVE Sensitive     CEFTAZIDIME <=1 SENSITIVE Sensitive     CIPROFLOXACIN <=0.25 SENSITIVE Sensitive     GENTAMICIN <=1 SENSITIVE Sensitive     IMIPENEM <=0.25 SENSITIVE Sensitive     TRIMETH/SULFA <=20 SENSITIVE Sensitive     PIP/TAZO 8  SENSITIVE Sensitive     * ENTEROBACTER CLOACAE  Blood Culture (routine x 2)     Status: Abnormal   Collection Time: 01/16/23  7:44 AM   Specimen: BLOOD RIGHT ARM  Result Value Ref Range Status  Specimen Description   Final    BLOOD RIGHT ARM Performed at [facility], [place] [address], [place], [place] [address]    Special Requests   Final    BOTTLES DRAWN AEROBIC AND ANAEROBIC Blood Culture results may not be optimal due to an excessive volume of blood received in culture bottles Performed at [facility], [address] [place], [place] [address]    Culture  Setup Time   Final    GRAM NEGATIVE RODS IN BOTH AEROBIC AND ANAEROBIC BOTTLES CRITICAL VALUE NOTED.  VALUE IS CONSISTENT WITH PREVIOUSLY REPORTED AND CALLED VALUE. PREVIOUSLY REPORTED AS: GRAM POSITIVE RODS CORRECTED RESULTS CALLED TO: PHARMD L.BILL AT 0754 ON 01/19/2023 BY T.SAAD.    Culture (A)  Final    ENTEROBACTER CLOACAE SUSCEPTIBILITIES PERFORMED ON PREVIOUS CULTURE WITHIN THE LAST 5 DAYS. Performed at [facility], [place] [address], [place], [place] [address]    Report Status 01/19/2023 FINAL  Final  Blood Culture ID Panel (Reflexed)     Status: Abnormal   Collection Time: 01/16/23  7:44 AM  Result Value Ref Range Status   Enterococcus faecalis NOT DETECTED NOT DETECTED Final   Enterococcus Faecium NOT DETECTED NOT DETECTED Final   Listeria monocytogenes NOT DETECTED NOT DETECTED Final   Staphylococcus species NOT DETECTED NOT DETECTED Final   Staphylococcus aureus (BCID) NOT DETECTED NOT DETECTED Final   Staphylococcus epidermidis NOT DETECTED NOT DETECTED Final   Staphylococcus lugdunensis NOT DETECTED NOT DETECTED Final   Streptococcus species NOT DETECTED NOT DETECTED Final   Streptococcus agalactiae NOT DETECTED NOT DETECTED Final   Streptococcus pneumoniae NOT DETECTED NOT DETECTED Final   Streptococcus pyogenes NOT DETECTED NOT DETECTED Final   A.calcoaceticus-baumannii NOT DETECTED  NOT DETECTED Final   Bacteroides fragilis NOT DETECTED NOT DETECTED Final   Enterobacterales DETECTED (A) NOT DETECTED Final    Comment: Enterobacterales represent a large order of gram negative bacteria, not a single organism. CRITICAL RESULT CALLED TO, READ BACK BY AND VERIFIED WITH: BRITTANY JOHNSON@0000$  01/17/23 RH    Enterobacter cloacae complex DETECTED (A) NOT DETECTED Final    Comment: CRITICAL RESULT CALLED TO, READ BACK BY AND VERIFIED WITH: BRITTANY JOHNSON@0000$  01/17/23 RH    Escherichia coli NOT DETECTED NOT DETECTED Final   Klebsiella aerogenes NOT DETECTED NOT DETECTED Final   Klebsiella oxytoca NOT DETECTED NOT DETECTED Final   Klebsiella pneumoniae NOT DETECTED NOT DETECTED Final   Proteus species NOT DETECTED NOT DETECTED Final   Salmonella species NOT DETECTED NOT DETECTED Final   Serratia marcescens NOT DETECTED NOT DETECTED Final   Haemophilus influenzae NOT DETECTED NOT DETECTED Final   Neisseria meningitidis NOT DETECTED NOT DETECTED Final   Pseudomonas aeruginosa NOT DETECTED NOT DETECTED Final   Stenotrophomonas maltophilia NOT DETECTED NOT DETECTED Final   Candida albicans NOT DETECTED NOT DETECTED Final   Candida auris NOT DETECTED NOT DETECTED Final   Candida glabrata NOT DETECTED NOT DETECTED Final   Candida krusei NOT DETECTED NOT DETECTED Final   Candida parapsilosis NOT DETECTED NOT DETECTED Final   Candida tropicalis NOT DETECTED NOT DETECTED Final   Cryptococcus neoformans/gattii NOT DETECTED NOT DETECTED Final   CTX-M ESBL NOT DETECTED NOT DETECTED Final   Carbapenem resistance IMP NOT DETECTED NOT DETECTED Final   Carbapenem resistance KPC NOT DETECTED NOT DETECTED Final   Carbapenem resistance NDM NOT DETECTED NOT DETECTED Final   Carbapenem resist OXA 48 LIKE NOT DETECTED NOT DETECTED Final   Carbapenem resistance VIM NOT DETECTED  NOT DETECTED Final    Comment: Performed at [facility], [address] [place], [place] [address]  MRSA  Next Gen by PCR, Nasal     Status: None   Collection Time: 01/16/23 12:52 PM   Specimen: Nasal Mucosa; Nasal Swab  Result Value Ref Range Status   MRSA by PCR Next Gen NOT DETECTED NOT DETECTED Final    Comment: (NOTE) The GeneXpert MRSA Assay (FDA approved for NASAL specimens only), is one component of a comprehensive MRSA colonization surveillance program. It is not intended to diagnose MRSA infection nor to guide or monitor treatment for MRSA infections. Test performance is not FDA approved in patients less than 75 years old. Performed at [facility], [place] [address], [place], [place] [address]     RADIOLOGY STUDIES/RESULTS: No results found.   LOS: 4 days   Mercy Riding, MD  [facility]    To contact the attending provider between 7A-7P or the covering provider during after hours 7P-7A, please log into the web site [email or web address] and access using universal [place] password for that web site. If you do not have the password, please call the hospital operator.  01/20/2023, 12:49 PM

## 2023-01-21 ENCOUNTER — Encounter (HOSPITAL_COMMUNITY): Payer: Self-pay | Admitting: Gastroenterology

## 2023-01-21 DIAGNOSIS — E1365 Other specified diabetes mellitus with hyperglycemia: Secondary | ICD-10-CM | POA: Diagnosis not present

## 2023-01-21 DIAGNOSIS — D6949 Other primary thrombocytopenia: Secondary | ICD-10-CM

## 2023-01-21 DIAGNOSIS — I10 Essential (primary) hypertension: Secondary | ICD-10-CM | POA: Diagnosis not present

## 2023-01-21 DIAGNOSIS — A419 Sepsis, unspecified organism: Secondary | ICD-10-CM | POA: Diagnosis not present

## 2023-01-21 DIAGNOSIS — R7989 Other specified abnormal findings of blood chemistry: Secondary | ICD-10-CM

## 2023-01-21 DIAGNOSIS — B9689 Other specified bacterial agents as the cause of diseases classified elsewhere: Secondary | ICD-10-CM

## 2023-01-21 LAB — CBC WITH DIFFERENTIAL/PLATELET
Abs Immature Granulocytes: 0 10*3/uL (ref 0.00–0.07)
Basophils Absolute: 0 10*3/uL (ref 0.0–0.1)
Basophils Relative: 0 %
Eosinophils Absolute: 0.2 10*3/uL (ref 0.0–0.5)
Eosinophils Relative: 2 %
HCT: 42.3 % (ref 39.0–52.0)
Hemoglobin: 14.3 g/dL (ref 13.0–17.0)
Lymphocytes Relative: 17 %
Lymphs Abs: 1.6 10*3/uL (ref 0.7–4.0)
MCH: 35.1 pg — ABNORMAL HIGH (ref 26.0–34.0)
MCHC: 33.8 g/dL (ref 30.0–36.0)
MCV: 103.9 fL — ABNORMAL HIGH (ref 80.0–100.0)
Monocytes Absolute: 0.8 10*3/uL (ref 0.1–1.0)
Monocytes Relative: 8 %
Neutro Abs: 7.1 10*3/uL (ref 1.7–7.7)
Neutrophils Relative %: 73 %
Platelets: 98 10*3/uL — ABNORMAL LOW (ref 150–400)
RBC: 4.07 MIL/uL — ABNORMAL LOW (ref 4.22–5.81)
RDW: 13.9 % (ref 11.5–15.5)
WBC: 9.7 10*3/uL (ref 4.0–10.5)
nRBC: 0.3 % — ABNORMAL HIGH (ref 0.0–0.2)

## 2023-01-21 LAB — COMPREHENSIVE METABOLIC PANEL
ALT: 270 U/L — ABNORMAL HIGH (ref 0–44)
AST: 96 U/L — ABNORMAL HIGH (ref 15–41)
Albumin: 2.8 g/dL — ABNORMAL LOW (ref 3.5–5.0)
Alkaline Phosphatase: 236 U/L — ABNORMAL HIGH (ref 38–126)
Anion gap: 11 (ref 5–15)
BUN: 34 mg/dL — ABNORMAL HIGH (ref 8–23)
CO2: 23 mmol/L (ref 22–32)
Calcium: 9 mg/dL (ref 8.9–10.3)
Chloride: 102 mmol/L (ref 98–111)
Creatinine, Ser: 1.06 mg/dL (ref 0.61–1.24)
GFR, Estimated: >60 mL/min (ref >60)
Glucose, Bld: 141 mg/dL — ABNORMAL HIGH (ref 70–99)
Potassium: 4.4 mmol/L (ref 3.5–5.1)
Sodium: 136 mmol/L (ref 135–145)
Total Bilirubin: 2.4 mg/dL — ABNORMAL HIGH (ref 0.3–1.2)
Total Protein: 6.3 g/dL — ABNORMAL LOW (ref 6.5–8.1)

## 2023-01-21 LAB — MAGNESIUM: Magnesium: 1.8 mg/dL (ref 1.7–2.4)

## 2023-01-21 LAB — PHOSPHORUS: Phosphorus: 2.3 mg/dL — ABNORMAL LOW (ref 2.5–4.6)

## 2023-01-21 LAB — GLUCOSE, CAPILLARY
Glucose-Capillary: 237 mg/dL — ABNORMAL HIGH (ref 70–99)
Glucose-Capillary: 158 mg/dL — ABNORMAL HIGH (ref 70–99)

## 2023-01-21 LAB — CK: Total CK: 25 U/L — ABNORMAL LOW (ref 49–397)

## 2023-01-21 MED ORDER — SODIUM CHLORIDE 0.9 % IV SOLN: 2.0000 g | Freq: Three times a day (TID) | INTRAVENOUS | Status: DC

## 2023-01-21 MED ORDER — ATORVASTATIN CALCIUM 40 MG PO TABS: 40.0000 mg | ORAL_TABLET | Freq: Every day | ORAL | Status: AC

## 2023-01-21 MED ORDER — LEVOFLOXACIN 750 MG PO TABS: 750.0000 mg | ORAL_TABLET | Freq: Every day | ORAL | 0 refills | Status: AC

## 2023-01-21 MED ORDER — LISINOPRIL-HYDROCHLOROTHIAZIDE 20-12.5 MG PO TABS: 1.0000 | ORAL_TABLET | Freq: Two times a day (BID) | ORAL | Status: AC

## 2023-01-21 MED ORDER — AMLODIPINE BESYLATE 5 MG PO TABS: 5.0000 mg | ORAL_TABLET | Freq: Every day | ORAL | 0 refills | Status: AC

## 2023-01-21 NOTE — Evaluation (Signed)
Physical Therapy Evaluation Patient Details Name: Victor Rojas MRN: [id number] DOB: 19-Aug-1946 Today's Date: 01/21/2023  History of Present Illness  Patient is a 77 y.o.  male who presented with septic shock in the setting of cholangitis/choledocholithiasis and enterococcal bacteremia. S/p ERCP.  Remains on IV cefepime until 2/13. PMHx:  HTN, HLD, DM-2, prior cholecystectomy  Clinical Impression   Pt presents with generalized weakness, impaired activity tolerance vs baseline, and decreased balance needing to use RW this date. Pt to benefit from acute PT to address deficits. Pt ambulated short hallway distance with RW, min cues for form but no physical assist needed at this time. Pt will have daughter to assist PRN at d/c, PT discussed slowly building activity tolerance at home and pt expresses understanding. PT to progress mobility as tolerated, and will continue to follow acutely.         Recommendations for follow up therapy are one component of a multi-disciplinary discharge planning process, led by the attending physician.  Recommendations may be updated based on patient status, additional functional criteria and insurance authorization.  Follow Up Recommendations No PT follow up      Assistance Recommended at Discharge Set up Supervision/Assistance  Patient can return home with the following  A little help with walking and/or transfers;Assistance with cooking/housework    Equipment Recommendations None recommended by PT  Recommendations for Other Services       Functional Status Assessment Patient has had a recent decline in their functional status and demonstrates the ability to make significant improvements in function in a reasonable and predictable amount of time.     Precautions / Restrictions Precautions Precautions: Fall Restrictions Weight Bearing Restrictions: No      Mobility  Bed Mobility               General bed mobility comments: in chair     Transfers Overall transfer level: Needs assistance Equipment used: Rolling walker (2 wheels) Transfers: Sit to/from Stand Sit to Stand: Supervision           General transfer comment: safety, slow to rise/sit    Ambulation/Gait Ambulation/Gait assistance: Supervision Gait Distance (Feet): 100 Feet Assistive device: Rolling walker (2 wheels) Gait Pattern/deviations: Step-through pattern, Decreased stride length, Trunk flexed Gait velocity: decr     General Gait Details: cues for close proximity of RW  Stairs            Wheelchair Mobility    Modified Rankin (Stroke Patients Only)       Balance Overall balance assessment: Needs assistance Sitting-balance support: Feet supported Sitting balance-Leahy Scale: Good     Standing balance support: During functional activity, Bilateral upper extremity supported Standing balance-Leahy Scale: Fair                               Pertinent Vitals/Pain Pain Assessment Pain Assessment: No/denies pain    Home Living Family/patient expects to be discharged to:: Private residence Living Arrangements: Alone Available Help at Discharge: Family;Available PRN/intermittently Type of Home: House Home Access: Ramped entrance       Home Layout: One level Home Equipment: Conservation officer, nature (2 wheels);Cane - single point;BSC/3in1;Shower seat;Wheelchair - manual      Prior Function Prior Level of Function : Independent/Modified Independent             Mobility Comments: SPC intermittently ADLs Comments: indep     Hand Dominance   Dominant Hand: Right  Extremity/Trunk Assessment   Upper Extremity Assessment Upper Extremity Assessment: Defer to OT evaluation    Lower Extremity Assessment Lower Extremity Assessment: Generalized weakness    Cervical / Trunk Assessment Cervical / Trunk Assessment: Normal  Communication   Communication: No difficulties  Cognition Arousal/Alertness:  Awake/alert Behavior During Therapy: WFL for tasks assessed/performed Overall Cognitive Status: Within Functional Limits for tasks assessed                                 General Comments: some inconsistencies in respones to PT/OT/mobility specialist about PLOF and willingness to use RW (pt refused RW use with mobility specialist, open to RW and states he will use one at home with PT)        General Comments General comments (skin integrity, edema, etc.): VSS on RA    Exercises     Assessment/Plan    PT Assessment Patient needs continued PT services  PT Problem List Decreased strength;Decreased mobility;Decreased activity tolerance;Decreased balance;Decreased knowledge of use of DME;Decreased safety awareness       PT Treatment Interventions DME instruction;Therapeutic activities;Therapeutic exercise;Gait training;Patient/family education;Balance training;Neuromuscular re-education;Functional mobility training    PT Goals (Current goals can be found in the Care Plan section)  Acute Rehab PT Goals Patient Stated Goal: home PT Goal Formulation: With patient Time For Goal Achievement: 02/04/23 Potential to Achieve Goals: Good    Frequency Min 3X/week     Co-evaluation               AM-PAC PT "6 Clicks" Mobility  Outcome Measure Help needed turning from your back to your side while in a flat bed without using bedrails?: A Little Help needed moving from lying on your back to sitting on the side of a flat bed without using bedrails?: A Little Help needed moving to and from a bed to a chair (including a wheelchair)?: A Little Help needed standing up from a chair using your arms (e.g., wheelchair or bedside chair)?: A Little Help needed to walk in hospital room?: A Little Help needed climbing 3-5 steps with a railing? : A Lot 6 Click Score: 17    End of Session   Activity Tolerance: Patient tolerated treatment well Patient left: in chair;with call  bell/phone within reach Nurse Communication: Mobility status PT Visit Diagnosis: Other abnormalities of gait and mobility (R26.89);Muscle weakness (generalized) (M62.81)    Time: [id number] PT Time Calculation (min) (ACUTE ONLY): 10 min   Charges:   PT Evaluation $PT Eval Low Complexity: 1 Low         Kasandra Fehr S, PT DPT Acute Rehabilitation Services Pager [phone]  Office [phone]   [place] E Ruffin Pyo 01/21/2023, 10:34 AM

## 2023-01-21 NOTE — Progress Notes (Signed)
Mobility Specialist - Progress Note   01/21/23 0913  Mobility  Activity Ambulated with assistance in hallway  Level of Assistance Contact guard assist, steadying assist  Assistive Device Other (Comment) (Hand rails)  Distance Ambulated (ft) 60 ft  Activity Response Tolerated well  Mobility Referral Yes  $Mobility charge 1 Mobility   Pt received in chair and agreeable to ambulation.Pt c/o weakness during ambulation, using hand rails for stability. Pt was returned to chair with all needs met.   Franki Monte  Mobility Specialist Please contact via Solicitor or Rehab office at [phone]

## 2023-01-21 NOTE — Discharge Summary (Signed)
Physician Discharge Summary  TRITON ALTICE [id number] DOB: [birth year] DOA: 01/16/2023  PCP: Baxter Hire, MD  Admit date: 01/16/2023 Discharge date: 01/21/2023 Admitted From: Home Disposition: Home Recommendations for Outpatient Follow-up:  Patient has upcoming appointment with PCP in 2 days Check CMP, CBC and blood pressure at follow-up Please follow up on the following pending results: None  Home Health: No need identified. Equipment/Devices: No need identified.  Discharge Condition: Stable CODE STATUS: Full code  Follow-up Information     Baxter Hire, MD. Schedule an appointment as soon as possible for a visit in 2 day(s).   Specialty: Internal Medicine Contact information: [place] [address] [address] [facility] 77 y.o.  male with history of HTN, HLD, DM-2, obesity, prior cholecystectomy initially seen at [facility] on 2/6 and found to have elevated LFT, hyperbilirubinemia and CT concerning for choledocholithiasis.  Patient left AMA and returned the next day and found to be in septic shock in the setting of cholangitis/choledocholithiasis and enterococcal bacteremia.  He was transferred to [facility] and admitted to ICU.  He also had elevated troponin felt to be demand ischemia in the setting of sepsis.  MRCP confirmed choledocholithiasis with ascending cholangitis and mild pancreatitis.  Patient underwent ERCP with removal of choledocholithiasis without pus.  He was transferred to [facility] hospital[facility] service on 01/18/2022.  Patient was treated with IV cefepime in-house and discharged on p.o. Levaquin for 2 more days to complete treatment course.   On the day of discharge, sepsis physiology and AKI resolved.  Liver enzymes improved tremendously.  He tolerated soft diet without problem.  He was evaluated by therapy and no need is identified.  Advised to hold his lisinopril/HCTZ for 4 days and that his Lipitor for  1 week.   See individual problem list below for more.   Problems addressed during this hospitalization Principal Problem:   Septic shock ([place]) Active Problems:   Uncontrolled hypertension   Abnormal LFTs (liver function tests)   Primary thrombocytopenia (H[facility])   Acute cholangitis   Controlled diabetes mellitus with hyperglycemia ([place])   Bacteremia due to Enterobacter species   Septic shock due to choledocholithiasis/cholangitis with Enterobacter bacteremia: Sepsis physiology resolving except for mild leukocytosis.  LFT improved.  Tolerated regular diet. -Received IV cefepime for 6 days and discharged on p.o. Levaquin for 2 more days. -Recheck CMP at follow-up   AKI: In the setting of septic shock.  No obstruction on CT. resolved. -Hold lisinopril/HCTZ for pulmonary -Recheck renal function at follow-up   Elevated troponin/demand ischemia: In the setting of sepsis and shock.  Evaluated by cardiology who recommended outpatient follow-up for stress test.  Echo without wall motion abnormality. -Continue beta-blocker and aspirin -Hold statin until liver enzymes normalize   Elevated liver enzyme: Likely due to #1.  Improving. -Recheck at follow-up.   Acute on chronic thrombocytopenia: Likely due to sepsis.  Improving. -Recheck CBC at follow-up     Controlled NIDDM-2 with hyperglycemia: A1c 6.6%.  On metformin at home. -Continue home metformin  Uncontrolled hypertension: BP improved. -Resume home metoprolol and amlodipine.  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 more than 1 week back Out of the window for withdrawal symptoms   Obesity: Body mass index is  32.95 kg/m.\ Nutrition Problem: Increased nutrient needs Etiology: acute illness Signs/Symptoms: estimated needs Interventions: Refer to RD note for recommendations     Vital  signs Vitals:   01/20/23 1527 01/20/23 1959 01/21/23 0422 01/21/23 0939  BP: (!) 154/109 117/75 (!) 157/92 125/83  Pulse: 98 84 90 94  Temp: 97.8 F (36.6 C) 98.1 F (36.7 C) 98 F (36.7 C) (!) 97.5 F (36.4 C)  Resp: 18 16 17 17  $ Weight:      SpO2: 99% 98% 97% 97%  TempSrc: Oral Oral Oral Oral  BMI (Calculated):         Discharge exam  GENERAL: No apparent distress.  Nontoxic. HEENT: MMM.  Vision and hearing grossly intact.  NECK: Supple.  No apparent JVD.  RESP:  No IWOB.  Fair aeration bilaterally. CVS:  RRR. Heart sounds normal.  ABD/GI/GU: BS+. Abd soft, NTND.  MSK/EXT:  Moves extremities. No apparent deformity. No edema.  SKIN: no apparent skin lesion or wound NEURO: Awake and alert. Oriented appropriately.  No apparent focal neuro deficit. PSYCH: Calm. Normal affect.   Discharge Instructions Discharge Instructions     Diet - low sodium heart healthy   Complete by: As directed    Discharge instructions   Complete by: As directed    It has been a pleasure taking care of you!  You were hospitalized for biliary tract infection and bloodstream infection for which you have been treated surgically medically.  Your symptoms resolved with treatment.  We are discharging you more antibiotics to complete treatment course.  We have made some adjustment to you home medication during this hospitalization.  Please review your new medication list and the directions on your medications before you take them.  Follow-up with your primary care doctor 1 to 2 weeks or sooner if needed.   Take care,   Increase activity slowly   Complete by: As directed       Allergies as of 01/21/2023   No Known Allergies      Medication List     STOP taking these medications    cyanocobalamin 1000 MCG tablet Commonly known as: VITAMIN B12       TAKE these medications    allopurinol 300 MG tablet Commonly known as: ZYLOPRIM Take 300 mg by mouth daily.   amLODipine 5 MG  tablet Commonly known as: NORVASC Take 1 tablet (5 mg total) by mouth daily. What changed:  medication strength how much to take   ascorbic acid 500 MG tablet Commonly known as: VITAMIN C Take 500 mg by mouth daily.   aspirin EC 81 MG tablet Take 81 mg by mouth daily.   atorvastatin 40 MG tablet Commonly known as: LIPITOR Take 1 tablet (40 mg total) by mouth daily. Start taking on: January 28, 2023 What changed: These instructions start on January 28, 2023. If you are unsure what to do until then, ask your doctor or other care provider.   D3-1000 25 MCG (1000 UT) tablet Generic drug: Cholecalciferol Take 1,000 Units by mouth daily.   levofloxacin 750 MG tablet Commonly known as: Levaquin Take 1 tablet (750 mg total) by mouth daily for 2 days.   lisinopril-hydrochlorothiazide 20-12.5 MG tablet Commonly known as: ZESTORETIC Take 1 tablet by mouth 2 (two) times daily. Start taking on: January 25, 2023 What changed: These instructions start on January 25, 2023. If you are unsure what to do until then, ask your doctor or other care provider.   metFORMIN 500 MG tablet Commonly  known as: GLUCOPHAGE Take 500 mg by mouth 2 (two) times daily.   metoprolol tartrate 25 MG tablet Commonly known as: LOPRESSOR Take 25 mg by mouth 2 (two) times daily.   Multi-Vitamins Tabs Take 1 tablet by mouth daily.   pantoprazole 40 MG tablet Commonly known as: PROTONIX Take 40 mg by mouth daily.        Consultations: Gastroenterology Cardiology  Procedures/Studies: ERCP   DG ERCP  Result Date: 01/17/2023 CLINICAL DATA:  ERCP for jaundice. History of cholecystectomy with choledocholithiasis demonstrated on preceding MRCP. EXAM: ERCP TECHNIQUE: Multiple spot images obtained with the fluoroscopic device and submitted for interpretation post-procedure. FLUOROSCOPY TIME: FLUOROSCOPY TIME 38 seconds (17.1 mGy) COMPARISON:  MRCP-01/16/2023 FINDINGS: Ten spot intraoperative fluoroscopic  images of the right upper abdominal quadrant during ERCP are provided for review Initial image demonstrates an ERCP probe overlying the right upper abdominal quadrant. Subsequent images demonstrate selective cannulation and opacification of the common bile duct which appears mildly dilated. Subsequent images demonstrate insufflation of a balloon within distal aspect of the CBD with subsequent biliary sweeping and presumed sphincterotomy There is minimal opacification of the intrahepatic biliary tree and residual cystic duct. There is no definitive opacification of the pancreatic duct. IMPRESSION: ERCP with biliary sweeping and presumed sphincterotomy as above. These images were submitted for radiologic interpretation only. Please see the procedural report for the amount of contrast and the fluoroscopy time utilized. Electronically Signed   By: Sandi Mariscal M.D.   On: 01/17/2023 15:04   ECHOCARDIOGRAM COMPLETE  Result Date: 01/17/2023    ECHOCARDIOGRAM REPORT   Patient Name:   Victor Rojas Date of Exam: 01/17/2023 Medical Rec #:  [id number]        Height:       68.0 in Accession #:    [id number]       Weight:       216.7 lb Date of Birth:  July 10, 1946        BSA:          2.115 m Patient Age:    22 years         BP:           109/52 mmHg Patient Gender: M                HR:           86 bpm. Exam Location:  Inpatient Procedure: 2D Echo, Cardiac Doppler and Color Doppler Indications:    Elevated Troponin  History:        Patient has no prior history of Echocardiogram examinations. PAD                 and TIA, Arrythmias:Tachycardia; Risk Factors:Hypertension,                 Diabetes and Dyslipidemia.  Sonographer:    Ronny Flurry Referring Phys: [phone] [place]  1. Left ventricular ejection fraction, by estimation, is 60 to 65%. The left ventricle has normal function. The left ventricle has no regional wall motion abnormalities. Left ventricular diastolic parameters are indeterminate.  2. Right  ventricular systolic function is normal. The right ventricular size is normal.  3. The mitral valve is normal in structure. No evidence of mitral valve regurgitation. No evidence of mitral stenosis.  4. The aortic valve is tricuspid. Aortic valve regurgitation is not visualized. No aortic stenosis is present.  5. Aortic dilatation noted. There is mild dilatation of the ascending aorta, measuring 40 mm.  6. The inferior vena cava is dilated in size with <50% respiratory variability, suggesting right atrial pressure of 15 mmHg. Comparison(s): No prior Echocardiogram. FINDINGS  Left Ventricle: Left ventricular ejection fraction, by estimation, is 60 to 65%. The left ventricle has normal function. The left ventricle has no regional wall motion abnormalities. The left ventricular internal cavity size was normal in size. There is  no left ventricular hypertrophy. Left ventricular diastolic parameters are indeterminate. Right Ventricle: The right ventricular size is normal. Right ventricular systolic function is normal. Left Atrium: Left atrial size was normal in size. Right Atrium: Right atrial size was normal in size. Pericardium: Trivial pericardial effusion is present. Mitral Valve: The mitral valve is normal in structure. Mild mitral annular calcification. No evidence of mitral valve regurgitation. No evidence of mitral valve stenosis. Tricuspid Valve: The tricuspid valve is normal in structure. Tricuspid valve regurgitation is mild . No evidence of tricuspid stenosis. Aortic Valve: The aortic valve is tricuspid. Aortic valve regurgitation is not visualized. No aortic stenosis is present. Aortic valve mean gradient measures 3.0 mmHg. Aortic valve peak gradient measures 6.7 mmHg. Aortic valve area, by VTI measures 3.61 cm. Pulmonic Valve: The pulmonic valve was normal in structure. Pulmonic valve regurgitation is mild. No evidence of pulmonic stenosis. Aorta: Aortic dilatation noted. There is mild dilatation of the  ascending aorta, measuring 40 mm. Venous: The inferior vena cava is dilated in size with less than 50% respiratory variability, suggesting right atrial pressure of 15 mmHg. IAS/Shunts: No atrial level shunt detected by color flow Doppler.  LEFT VENTRICLE PLAX 2D LVIDd:         4.90 cm   Diastology LVIDs:         3.60 cm   LV e' medial:    6.42 cm/s LV PW:         1.00 cm   LV E/e' medial:  14.5 LV IVS:        1.10 cm   LV e' lateral:   8.05 cm/s LVOT diam:     2.30 cm   LV E/e' lateral: 11.6 LV SV:         90 LV SV Index:   43 LVOT Area:     4.15 cm                           3D Volume EF:                          3D EF:        56 %                          LV EDV:       222 ml                          LV ESV:       98 ml                          LV SV:        124 ml RIGHT VENTRICLE             IVC RV S prime:     16.20 cm/s  IVC diam: 2.70 cm TAPSE (M-mode): 1.8 cm LEFT ATRIUM  Index        RIGHT ATRIUM           Index LA diam:        4.60 cm 2.18 cm/m   RA Area:     19.20 cm LA Vol (A2C):   49.0 ml 23.17 ml/m  RA Volume:   53.70 ml  25.39 ml/m LA Vol (A4C):   56.7 ml 26.81 ml/m LA Biplane Vol: 54.8 ml 25.91 ml/m  AORTIC VALVE AV Area (Vmax):    3.24 cm AV Area (Vmean):   3.45 cm AV Area (VTI):     3.61 cm AV Vmax:           129.00 cm/s AV Vmean:          85.700 cm/s AV VTI:            0.250 m AV Peak Grad:      6.7 mmHg AV Mean Grad:      3.0 mmHg LVOT Vmax:         100.67 cm/s LVOT Vmean:        71.233 cm/s LVOT VTI:          0.217 m LVOT/AV VTI ratio: 0.87  AORTA Ao Root diam: 3.30 cm Ao Asc diam:  4.00 cm MITRAL VALVE               TRICUSPID VALVE MV Area (PHT): 4.31 cm    TR Peak grad:   30.2 mmHg MV Decel Time: 176 msec    TR Vmax:        275.00 cm/s MV E velocity: 93.00 cm/s MV A velocity: 86.10 cm/s  SHUNTS MV E/A ratio:  1.08        Systemic VTI:  0.22 m                            Systemic Diam: 2.30 cm Kirk Ruths MD Electronically signed by Kirk Ruths MD Signature Date/Time:  01/17/2023/12:17:49 PM    Final    DG Chest Port 1 View  Result Date: 01/17/2023 CLINICAL DATA:  Respiratory failure EXAM: PORTABLE CHEST 1 VIEW COMPARISON:  None Available. FINDINGS: Normal mediastinum and cardiac silhouette. Normal pulmonary vasculature. No evidence of effusion, infiltrate, or pneumothorax. No acute bony abnormality. IMPRESSION: No acute cardiopulmonary process. Electronically Signed   By: Suzy Bouchard M.D.   On: 01/17/2023 09:38   MR ABDOMEN MRCP W WO CONTAST  Result Date: 01/16/2023 CLINICAL DATA:  Jaundice EXAM: MRI ABDOMEN WITHOUT AND WITH CONTRAST (INCLUDING MRCP) TECHNIQUE: Multiplanar multisequence MR imaging of the abdomen was performed both before and after the administration of intravenous contrast. Heavily T2-weighted images of the biliary and pancreatic ducts were obtained, and three-dimensional MRCP images were rendered by post processing. CONTRAST:  15m GADAVIST GADOBUTROL 1 MMOL/ML IV SOLN COMPARISON:  Multiple priors including CT and ultrasound dated January 15, 2022. FINDINGS: Despite efforts by the technologist and patient, motion artifact is present on today's exam and could not be eliminated. This reduces exam sensitivity and specificity. Lower chest: Heterogeneous signal in the bilateral lung bases commonly reflects atelectasis. Hepatobiliary: Periportal edema with peribiliary enhancement and increased biliary ductal dilation, the common duct measuring 11 mm in diameter. There are a few T2 hypointense foci in the distal duct on images 30/3 and 32/4. Heterogeneous enhancement of the hepatic parenchyma with corresponding reduced diffusivity in these areas. Diffuse hepatic steatosis.  Slight nodular hepatic contours. Pancreas:  Increased T2 signal  about the pancreatic head. Spleen:  No splenomegaly. Adrenals/Urinary Tract: Bilateral adrenal glands appear normal. Similar left renal atrophy. No hydronephrosis. Stomach/Bowel: Mild wall thickening of the ascending colon.  Vascular/Lymphatic: Prominent right upper quadrant lymph nodes for instance measuring 14 mm on image 45/1204. The portal, splenic and superior mesenteric veins are patent. Smooth IVC contours. Aortic atherosclerosis. Other: No walled off fluid collections. Trace perihepatic free fluid. Musculoskeletal: No suspicious bone lesions identified. IMPRESSION: Examination is significantly degraded by respiratory motion limiting sensitivity and specificity. Within this context: 1. Choledocholithiasis with increased biliary ductal dilation, periportal edema and peribiliary enhancement. Additionally there is heterogeneous enhancement of the hepatic parenchyma with associated areas of reduced diffusivity in the hepatic parenchyma. Findings which are highly concerning for choledocholithiasis with ascending cholangitis. 2. Increased T2 signal about the pancreatic head which may reflect mild pancreatitis. 3. Mild wall thickening of the ascending colon may be reactive or reflect colitis. 4. Diffuse hepatic steatosis with slight nodular hepatic contours which may reflect early cirrhosis. 5. Prominent right upper quadrant lymph nodes are nonspecific and may be reactive. 6. Heterogeneous signal in the bilateral lung bases commonly reflects atelectasis. These results will be called to the ordering clinician or representative by the Radiologist Assistant, and communication documented in the PACS or [employer]. Electronically Signed   By: Dahlia Bailiff M.D.   On: 01/16/2023 16:41   CT ABDOMEN PELVIS W CONTRAST  Addendum Date: 01/15/2023   ADDENDUM REPORT: 01/15/2023 15:21 ADDENDUM: There is mild increased density within the lumen of the distal common bile duct that appears new compared to the 03/01/2022 CT prior to the interval cholecystectomy (current CT axial series 3 images 33 through 38) which may represent a cluster of common bile duct stones measuring up to approximately 4 mm in transverse dimension and 11 mm in  craniocaudal length (coronal series 5, image 45). Critical Value/emergent results were called by telephone at the time of interpretation on 01/15/2023 at 3:15 pm to provider [facility] , who verbally acknowledged these results. Electronically Signed   By: Yvonne Kendall M.D.   On: 01/15/2023 15:21   Result Date: 01/15/2023 CLINICAL DATA:  Epigastric pain. Started last night. Pain thyroid and center of abdomen and radiates upwards. Some nausea. EXAM: CT ABDOMEN AND PELVIS WITH CONTRAST TECHNIQUE: Multidetector CT imaging of the abdomen and pelvis was performed using the standard protocol following bolus administration of intravenous contrast. RADIATION DOSE REDUCTION: This exam was performed according to the departmental dose-optimization program which includes automated exposure control, adjustment of the mA and/or kV according to patient size and/or use of iterative reconstruction technique. CONTRAST:  170m OMNIPAQUE IOHEXOL 300 MG/ML  SOLN COMPARISON:  Right upper quadrant abdominal ultrasound 03/01/2022; CT abdomen and pelvis 03/01/2022 FINDINGS: Lower chest: There is mild curvilinear and ground-glass likely subsegmental atelectasis and/or scarring within the bilateral lung bases. Minimal left foraminal right posterior pleural thickening appears unchanged from prior there is no definite pleural effusion. Dense coronary artery calcifications are again noted. No pericardial effusion. Cardiac silhouette is again at the upper limits of normal size to mildly enlarged. Possible minimal circumferential esophageal thickening is unchanged to mildly improved from prior and may be secondary to underdistention versus the sequela of prior esophagitis. Hepatobiliary: Smooth liver contours. Interval cholecystectomy. Minimal central intrahepatic biliary ductal dilatation and mild prominence of the common bile duct, likely normal postsurgical changes following cholecystectomy. No focal liver lesion is seen. Pancreas:  Unremarkable. No pancreatic ductal dilatation or surrounding inflammatory changes. Spleen: Unchanged two individual calcifications  within the spleen, possibly the sequela of remote granulomatous infection. Adrenals/Urinary Tract: Normal adrenals. There is again mild high-grade atrophy of the left kidney, chronic. There again may be minimally delayed left renal cortical enhancement compared to the right. Vascular calcifications are again seen within the bilateral renal hila. No hydronephrosis. No renal or ureteral stones are seen. The bilateral ureters are normal in caliber. No focal urinary bladder wall thickening. Stomach/Bowel: The terminal ileum is unremarkable. Normal appendix. No dilated loops of bowel are seen to indicate bowel obstruction. No bowel wall thickening. Vascular/Lymphatic: No abdominal aortic aneurysm. High-grade atherosclerotic calcifications within the aorta, bilateral iliac arteries, and origins of the major intra-abdominal aortic branch vessels, similar to prior. No mesenteric, retroperitoneal, or pelvic lymphadenopathy. Reproductive: The prostate is again mildly enlarged. The seminal vesicles are grossly unremarkable. Other: A fat containing left paraumbilical hernia is again seen with orifice measuring up to 11 mm and ventral herniated fat measuring up to approximately 4.7 cm in craniocaudal dimension and 5.1 cm in transverse dimension. This is similar in size to prior but there is new mild inflammatory stranding within the herniated fat (axial series 3 images 54 through 62). Small fat containing right inguinal hernia is unchanged. No free air or free fluid is seen within the abdomen or pelvis. Musculoskeletal: Moderate multilevel degenerative disc changes of the thoracic spine. Mild levocurvature centered at L3. Mild chronic anterior T11 vertebral body height loss. Large anterior T9-10 and T10-11 bridging osteophytes. IMPRESSION: Compared to 03/01/2022: 1. Interval cholecystectomy. 2.  Unchanged size of fat containing left paraumbilical hernia. There is new mild inflammatory stranding within the herniated fat. This may represent a source of pain. Recommend clinical correlation. 3. Unchanged chronic left renal atrophy. 4. Unchanged severe atherosclerotic calcifications within the aorta, bilateral iliac arteries, and origins of the major intra-abdominal aortic branch vessels. Electronically Signed: By: Yvonne Kendall M.D. On: 01/15/2023 12:57   US ABDOMEN LIMITED RUQ (LIVER/GB)  Result Date: 01/15/2023 CLINICAL DATA:  Elevated liver function tests EXAM: ULTRASOUND ABDOMEN LIMITED RIGHT UPPER QUADRANT COMPARISON:  CT of earlier today. FINDINGS: Gallbladder: Surgically absent Common bile duct: Diameter: Within normal limits after cholecystectomy, 8 mm. Liver: Mildly heterogeneously increased hepatic echogenicity. Portal vein is patent on color Doppler imaging with normal direction of blood flow towards the liver. Other: None. IMPRESSION: Suspect mild hepatic steatosis. No other explanation for elevated liver function tests. Electronically Signed   By: Abigail Miyamoto M.D.   On: 01/15/2023 15:05       The results of significant diagnostics from this hospitalization (including imaging, microbiology, ancillary and laboratory) are listed below for reference.     Microbiology: Recent Results (from the past 240 hour(s))  Resp panel by RT-PCR (RSV, Flu A&B, Covid) Anterior Nasal Swab     Status: None   Collection Time: 01/16/23  7:34 AM   Specimen: Anterior Nasal Swab  Result Value Ref Range Status   SARS Coronavirus 2 by RT PCR NEGATIVE NEGATIVE Final    Comment: (NOTE) SARS-CoV-2 target nucleic acids are NOT DETECTED.  The SARS-CoV-2 RNA is generally detectable in upper respiratory specimens during the acute phase of infection. The lowest concentration of SARS-CoV-2 viral copies this assay can detect is 138 copies/mL. A negative result does not preclude SARS-Cov-2 infection and should  not be used as the sole basis for treatment or other patient management decisions. A negative result may occur with  improper specimen collection/handling, submission of specimen other than nasopharyngeal swab, presence of viral mutation(s) within the areas  targeted by this assay, and inadequate number of viral copies(<138 copies/mL). A negative result must be combined with clinical observations, patient history, and epidemiological information. The expected result is Negative.  Fact Sheet for Patients:  EntrepreneurPulse.com.au  Fact Sheet for Healthcare Providers:  IncredibleEmployment.be  This test is no t yet approved or cleared by the [place] FDA and  has been authorized for detection and/or diagnosis of SARS-CoV-2 by FDA under an Emergency Use Authorization (EUA). This EUA will remain  in effect (meaning this test can be used) for the duration of the COVID-19 declaration under Section 564(b)(1) of the Act, 21 U.S.C.section 360bbb-3(b)(1), unless the authorization is terminated  or revoked sooner.       Influenza A by PCR NEGATIVE NEGATIVE Final   Influenza B by PCR NEGATIVE NEGATIVE Final    Comment: (NOTE) The Xpert Xpress SARS-CoV-2/FLU/RSV plus assay is intended as an aid in the diagnosis of influenza from Nasopharyngeal swab specimens and should not be used as a sole basis for treatment. Nasal washings and aspirates are unacceptable for Xpert Xpress SARS-CoV-2/FLU/RSV testing.  Fact Sheet for Patients: EntrepreneurPulse.com.au  Fact Sheet for Healthcare Providers: IncredibleEmployment.be  This test is not yet approved or cleared by the [place] FDA and has been authorized for detection and/or diagnosis of SARS-CoV-2 by FDA under an Emergency Use Authorization (EUA). This EUA will remain in effect (meaning this test can be used) for the duration of the COVID-19 declaration under  Section 564(b)(1) of the Act, 21 U.S.C. section 360bbb-3(b)(1), unless the authorization is terminated or revoked.     Resp Syncytial Virus by PCR NEGATIVE NEGATIVE Final    Comment: (NOTE) Fact Sheet for Patients: EntrepreneurPulse.com.au  Fact Sheet for Healthcare Providers: IncredibleEmployment.be  This test is not yet approved or cleared by the [place] FDA and has been authorized for detection and/or diagnosis of SARS-CoV-2 by FDA under an Emergency Use Authorization (EUA). This EUA will remain in effect (meaning this test can be used) for the duration of the COVID-19 declaration under Section 564(b)(1) of the Act, 21 U.S.C. section 360bbb-3(b)(1), unless the authorization is terminated or revoked.  Performed at [facility], [address], [place], [place] [address]   Blood Culture (routine x 2)     Status: Abnormal   Collection Time: 01/16/23  7:44 AM   Specimen: BLOOD  Result Value Ref Range Status   Specimen Description   Final    BLOOD  RIGHT ARM  Performed at [facility], [address], [place], [place] [address]    Special Requests   Final    BOTTLES DRAWN AEROBIC AND ANAEROBIC Blood Culture results may not be optimal due to an excessive volume of blood received in culture bottles Performed at [facility], [address] [place], [place] [address]    Culture  Setup Time   Final    GRAM NEGATIVE RODS IN BOTH AEROBIC AND ANAEROBIC BOTTLES CRITICAL RESULT CALLED TO, READ BACK BY AND VERIFIED WITH: Marye Round JOHNSON@0000$  01/17/23 RH Performed at [facility], [place] [address], [place], [place] [address]    Culture ENTEROBACTER CLOACAE (A)  Final   Report Status 01/19/2023 FINAL  Final   Organism ID, Bacteria ENTEROBACTER CLOACAE  Final      Susceptibility   Enterobacter cloacae - MIC*    CEFAZOLIN >=64 RESISTANT Resistant     CEFEPIME <=0.12 SENSITIVE Sensitive     CEFTAZIDIME <=1  SENSITIVE Sensitive     CIPROFLOXACIN <=0.25 SENSITIVE  Sensitive     GENTAMICIN <=1 SENSITIVE Sensitive     IMIPENEM <=0.25 SENSITIVE Sensitive     TRIMETH/SULFA <=20 SENSITIVE Sensitive     PIP/TAZO 8 SENSITIVE Sensitive     * ENTEROBACTER CLOACAE  Blood Culture (routine x 2)     Status: Abnormal   Collection Time: 01/16/23  7:44 AM   Specimen: BLOOD RIGHT ARM  Result Value Ref Range Status   Specimen Description   Final    BLOOD RIGHT ARM Performed at [facility], [address] [place], [place] [address]    Special Requests   Final    BOTTLES DRAWN AEROBIC AND ANAEROBIC Blood Culture results may not be optimal due to an excessive volume of blood received in culture bottles Performed at [facility], [address] [place], [place] [address]    Culture  Setup Time   Final    GRAM NEGATIVE RODS IN BOTH AEROBIC AND ANAEROBIC BOTTLES CRITICAL VALUE NOTED.  VALUE IS CONSISTENT WITH PREVIOUSLY REPORTED AND CALLED VALUE. PREVIOUSLY REPORTED AS: GRAM POSITIVE RODS CORRECTED RESULTS CALLED TO: PHARMD L.BILL AT 0754 ON 01/19/2023 BY T.SAAD.    Culture (A)  Final    ENTEROBACTER CLOACAE SUSCEPTIBILITIES PERFORMED ON PREVIOUS CULTURE WITHIN THE LAST 5 DAYS. Performed at [facility], [place] [address], [place], [place] [address]    Report Status 01/19/2023 FINAL  Final  Blood Culture ID Panel (Reflexed)     Status: Abnormal   Collection Time: 01/16/23  7:44 AM  Result Value Ref Range Status   Enterococcus faecalis NOT DETECTED NOT DETECTED Final   Enterococcus Faecium NOT DETECTED NOT DETECTED Final   Listeria monocytogenes NOT DETECTED NOT DETECTED Final   Staphylococcus species NOT DETECTED NOT DETECTED Final   Staphylococcus aureus (BCID) NOT DETECTED NOT DETECTED Final   Staphylococcus epidermidis NOT DETECTED NOT DETECTED Final   Staphylococcus lugdunensis NOT DETECTED NOT DETECTED Final   Streptococcus species NOT DETECTED NOT DETECTED Final    Streptococcus agalactiae NOT DETECTED NOT DETECTED Final   Streptococcus pneumoniae NOT DETECTED NOT DETECTED Final   Streptococcus pyogenes NOT DETECTED NOT DETECTED Final   A.calcoaceticus-baumannii NOT DETECTED NOT DETECTED Final   Bacteroides fragilis NOT DETECTED NOT DETECTED Final   Enterobacterales DETECTED (A) NOT DETECTED Final    Comment: Enterobacterales represent a large order of gram negative bacteria, not a single organism. CRITICAL RESULT CALLED TO, READ BACK BY AND VERIFIED WITH: BRITTANY JOHNSON@0000$  01/17/23 RH    Enterobacter cloacae complex DETECTED (A) NOT DETECTED Final    Comment: CRITICAL RESULT CALLED TO, READ BACK BY AND VERIFIED WITH: BRITTANY JOHNSON@0000$  01/17/23 RH    Escherichia coli NOT DETECTED NOT DETECTED Final   Klebsiella aerogenes NOT DETECTED NOT DETECTED Final   Klebsiella oxytoca NOT DETECTED NOT DETECTED Final   Klebsiella pneumoniae NOT DETECTED NOT DETECTED Final   Proteus species NOT DETECTED NOT DETECTED Final   Salmonella species NOT DETECTED NOT DETECTED Final   Serratia marcescens NOT DETECTED NOT DETECTED Final   Haemophilus influenzae NOT DETECTED NOT DETECTED Final   Neisseria meningitidis NOT DETECTED NOT DETECTED Final   Pseudomonas aeruginosa NOT DETECTED NOT DETECTED Final   Stenotrophomonas maltophilia NOT DETECTED NOT DETECTED Final   Candida albicans NOT DETECTED NOT DETECTED Final   Candida auris NOT DETECTED NOT DETECTED Final   Candida glabrata NOT DETECTED NOT DETECTED Final   Candida krusei NOT DETECTED NOT DETECTED Final   Candida parapsilosis NOT DETECTED NOT DETECTED Final  Candida tropicalis NOT DETECTED NOT DETECTED Final   Cryptococcus neoformans/gattii NOT DETECTED NOT DETECTED Final   CTX-M ESBL NOT DETECTED NOT DETECTED Final   Carbapenem resistance IMP NOT DETECTED NOT DETECTED Final   Carbapenem resistance KPC NOT DETECTED NOT DETECTED Final   Carbapenem resistance NDM NOT DETECTED NOT DETECTED Final    Carbapenem resist OXA 48 LIKE NOT DETECTED NOT DETECTED Final   Carbapenem resistance VIM NOT DETECTED NOT DETECTED Final    Comment: Performed at [facility], [address] [place], [place] [address]  MRSA Next Gen by PCR, Nasal     Status: None   Collection Time: 01/16/23 12:52 PM   Specimen: Nasal Mucosa; Nasal Swab  Result Value Ref Range Status   MRSA by PCR Next Gen NOT DETECTED NOT DETECTED Final    Comment: (NOTE) The GeneXpert MRSA Assay (FDA approved for NASAL specimens only), is one component of a comprehensive MRSA colonization surveillance program. It is not intended to diagnose MRSA infection nor to guide or monitor treatment for MRSA infections. Test performance is not FDA approved in patients less than 61 years old. Performed at [facility], [place] [address] [place], [place] [address]      Labs:  CBC: Recent Labs  Lab 01/16/23 0645 01/17/23 0037 01/17/23 1045 01/18/23 0021 01/19/23 0710 01/21/23 0549  WBC 13.7* 10.8*  --  5.7 12.7* 9.7  NEUTROABS 12.8*  --   --   --   --  7.1  HGB 13.7 11.1*  --  11.6* 13.8 14.3  HCT 40.9 33.2*  --  35.0* 38.9* 42.3  MCV 106.8* 106.8*  --  108.4* 101.6* 103.9*  PLT 112* 78* 78* 76* 102* 98*   BMP &GFR Recent Labs  Lab 01/17/23 0037 01/17/23 1045 01/18/23 0021 01/19/23 0710 01/21/23 0549  NA 135 135 131* 137 136  K 3.9 4.2 4.8 4.5 4.4  CL 102 100 98 105 102  CO2 22 22 25 $ 20* 23  GLUCOSE 98 111* 220* 175* 141*  BUN 36* 41* 46* 52* 34*  CREATININE 1.64* 1.77* 1.97* 1.71* 1.06  CALCIUM 8.0* 8.2* 8.1* 8.6* 9.0  MG 1.6* 2.7* 2.8*  --  1.8  PHOS 3.4  --  3.1  --  2.3*   Estimated Creatinine Clearance: 67.4 mL/min (by C-G formula based on SCr of 1.06 mg/dL). Liver & Pancreas: Recent Labs  Lab 01/16/23 1425 01/17/23 0037 01/18/23 0021 01/19/23 0710 01/21/23 0549  AST 611* 807* 388* 165* 96*  ALT 672* 833* 660* 493* 270*  ALKPHOS 119 104 147* 218* 236*  BILITOT 5.5* 5.1* 4.2* 2.9* 2.4*   PROT 5.3* 5.2* 5.7* 6.3* 6.3*  ALBUMIN 2.8* 2.7* 2.6* 2.9* 2.8*   Recent Labs  Lab 01/16/23 0622 01/16/23 1656 01/17/23 0037 01/17/23 1045 01/18/23 0021  LIPASE 25 21 20 22 26   $ No results for input(s): "AMMONIA" in the last 168 hours. Diabetic: No results for input(s): "HGBA1C" in the last 72 hours. Recent Labs  Lab 01/20/23 0816 01/20/23 1201 01/20/23 1719 01/20/23 2136 01/21/23 0920  GLUCAP 137* 237* 235* 170* 158*   Cardiac Enzymes: Recent Labs  Lab 01/21/23 0549  CKTOTAL 25*   No results for input(s): "PROBNP" in the last 8760 hours. Coagulation Profile: Recent Labs  Lab 01/16/23 0734 01/16/23 1425 01/17/23 0037 01/17/23 1045  INR 1.5* 1.7* 1.7* 1.6*   Thyroid Function Tests: No results for input(s): "TSH", "T4TOTAL", "FREET4", "T3FREE", "THYROIDAB" in the last 72 hours. Lipid Profile: No results  for input(s): "CHOL", "HDL", "LDLCALC", "TRIG", "CHOLHDL", "LDLDIRECT" in the last 72 hours. Anemia Panel: Recent Labs    01/20/23 1242  VITAMINB12 3,770*  FOLATE 20.3   Urine analysis:    Component Value Date/Time   COLORURINE AMBER (A) 01/15/2023 1011   APPEARANCEUR CLEAR (A) 01/15/2023 1011   LABSPEC 1.015 01/15/2023 1011   PHURINE 5.0 01/15/2023 1011   GLUCOSEU NEGATIVE 01/15/2023 1011   HGBUR NEGATIVE 01/15/2023 [place] 01/15/2023 1011   KETONESUR NEGATIVE 01/15/2023 1011   PROTEINUR NEGATIVE 01/15/2023 1011   NITRITE NEGATIVE 01/15/2023 1011   LEUKOCYTESUR NEGATIVE 01/15/2023 1011   Sepsis Labs: Invalid input(s): "PROCALCITONIN", "LACTICIDVEN"   SIGNED:  Mercy Riding, MD  [facility] 01/21/2023, 4:57 PM

## 2023-01-21 NOTE — Evaluation (Signed)
Occupational Therapy Evaluation Patient Details Name: Victor Rojas MRN: [id number] DOB: 18-Apr-1946 Today's Date: 01/21/2023   History of Present Illness Patient is a 77 y.o.  male who presented with septic shock in the setting of cholangitis/choledocholithiasis and enterococcal bacteremia. S/p ERCP.  Remains on IV cefepime until 2/13. PMHx:  HTN, HLD, DM-2, prior cholecystectomy   Clinical Impression   Pt was evaluated s/p the above admission list, he is typically indep at baseline and lives alone. Upon evaluation he was limited by unsteady gait, decreased activity tolerance and fall risk. Overall he required in G for mobility and ADLs without use of RW. However with education and encouragement, pt agreeable to use RW and progressed to generalized supervision A. OT to continue to follow acutely. Recommend d/c to home without OT follow up.    Recommendations for follow up therapy are one component of a multi-disciplinary discharge planning process, led by the attending physician.  Recommendations may be updated based on patient status, additional functional criteria and insurance authorization.   Follow Up Recommendations  No OT follow up     Assistance Recommended at Discharge Frequent or constant Supervision/Assistance  Patient can return home with the following A little help with walking and/or transfers;A little help with bathing/dressing/bathroom;Assistance with cooking/housework;Assist for transportation    Functional Status Assessment  Patient has had a recent decline in their functional status and demonstrates the ability to make significant improvements in function in a reasonable and predictable amount of time.  Equipment Recommendations  None recommended by OT       Precautions / Restrictions Precautions Precautions: Fall Restrictions Weight Bearing Restrictions: No      Mobility Bed Mobility Overal bed mobility: Needs Assistance             General bed  mobility comments: OOB upon arrival    Transfers Overall transfer level: Needs assistance Equipment used: Rolling walker (2 wheels) Transfers: Sit to/from Stand Sit to Stand: Min guard           General transfer comment: supervision A with RW. pt declined use initially and needed min G      Balance Overall balance assessment: Needs assistance Sitting-balance support: Feet supported Sitting balance-Leahy Scale: Normal     Standing balance support: No upper extremity supported, During functional activity Standing balance-Leahy Scale: Fair Standing balance comment: no overt LOB but unsteady                           ADL either performed or assessed with clinical judgement   ADL Overall ADL's : Needs assistance/impaired Eating/Feeding: Independent   Grooming: Min guard;Standing   Upper Body Bathing: Set up;Sitting   Lower Body Bathing: Min guard;Sit to/from stand   Upper Body Dressing : Set up   Lower Body Dressing: Min guard;Sit to/from stand   Toilet Transfer: Min guard;Ambulation   Toileting- Clothing Manipulation and Hygiene: Supervision/safety;Sitting/lateral lean       Functional mobility during ADLs: Min guard General ADL Comments: pt declined AD however is more steady with RW> enocuraged use at d.c to decrease fall risk     Vision Baseline Vision/History: 0 No visual deficits Vision Assessment?: No apparent visual deficits     Perception Perception Perception Tested?: No   Praxis Praxis Praxis tested?: Not tested    Pertinent Vitals/Pain Pain Assessment Pain Assessment: No/denies pain     Hand Dominance     Extremity/Trunk Assessment Upper Extremity Assessment Upper Extremity Assessment: Overall  WFL for tasks assessed   Lower Extremity Assessment Lower Extremity Assessment: Defer to PT evaluation   Cervical / Trunk Assessment Cervical / Trunk Assessment: Normal   Communication     Cognition Arousal/Alertness:  Awake/alert Behavior During Therapy: WFL for tasks assessed/performed Overall Cognitive Status: Within Functional Limits for tasks assessed                                 General Comments: WFL for simple tasks assessed, would benefit from highter level cog assessment     General Comments  VSS on RA            Home Living Family/patient expects to be discharged to:: Private residence Living Arrangements: Alone Available Help at Discharge: Family;Available PRN/intermittently Type of Home: House Home Access: Ramped entrance     Home Layout: One level     Bathroom Shower/Tub: Tub/shower unit;Walk-in shower   Bathroom Toilet: Standard     Home Equipment: Conservation officer, nature (2 wheels);Cane - single point;BSC/3in1;Shower seat;Wheelchair - manual          Prior Functioning/Environment Prior Level of Function : Independent/Modified Independent             Mobility Comments: SPC intermittently ADLs Comments: indep        OT Problem List: Decreased strength;Decreased activity tolerance;Decreased range of motion;Decreased safety awareness;Decreased knowledge of use of DME or AE;Decreased knowledge of precautions      OT Treatment/Interventions: Self-care/ADL training;Therapeutic exercise;DME and/or AE instruction;Therapeutic activities;Patient/family education;Balance training    OT Goals(Current goals can be found in the care plan section) Acute Rehab OT Goals Patient Stated Goal: home OT Goal Formulation: With patient Potential to Achieve Goals: Good ADL Goals Additional ADL Goal #1: Pt will complete all BADLs with mod I  OT Frequency: Min 2X/week       AM-PAC OT "6 Clicks" Daily Activity     Outcome Measure Help from another person eating meals?: None Help from another person taking care of personal grooming?: A Little Help from another person toileting, which includes using toliet, bedpan, or urinal?: A Little Help from another person bathing  (including washing, rinsing, drying)?: A Little Help from another person to put on and taking off regular upper body clothing?: A Little Help from another person to put on and taking off regular lower body clothing?: A Little 6 Click Score: 19   End of Session Nurse Communication: Mobility status  Activity Tolerance: Patient tolerated treatment well Patient left: in chair  OT Visit Diagnosis: Unsteadiness on feet (R26.81);Other abnormalities of gait and mobility (R26.89);Muscle weakness (generalized) (M62.81)                Time: 0902-0920 OT Time Calculation (min): 18 min Charges:  OT General Charges $OT Visit: 1 Visit OT Evaluation $OT Eval Moderate Complexity: 1 Mod  Shade Flood, OTR/L Acute Rehabilitation Services Office [phone] Secure Chat Communication Preferred   Elliot Cousin 01/21/2023, 9:45 AM

## 2023-01-21 NOTE — Care Management Important Message (Signed)
Important Message  Patient Details  Name: Victor Rojas MRN: [id number] Date of Birth: [birth year]   Medicare Important Message Given:  Yes Patient left prior to IM delivery will mail to the home address.     Annalysa Mohammad 01/21/2023, 2:44 PM

## 2023-01-24 ENCOUNTER — Encounter (INDEPENDENT_AMBULATORY_CARE_PROVIDER_SITE_OTHER): Payer: Self-pay | Admitting: Nurse Practitioner

## 2023-01-24 ENCOUNTER — Ambulatory Visit (INDEPENDENT_AMBULATORY_CARE_PROVIDER_SITE_OTHER): Payer: Medicare Other | Admitting: Nurse Practitioner

## 2023-01-24 ENCOUNTER — Other Ambulatory Visit (INDEPENDENT_AMBULATORY_CARE_PROVIDER_SITE_OTHER): Payer: Medicare Other

## 2023-01-24 VITALS — BP 102/66 | HR 105 | Resp 18 | Ht 68.0 in | Wt 196.8 lb

## 2023-01-24 DIAGNOSIS — E782 Mixed hyperlipidemia: Secondary | ICD-10-CM

## 2023-01-24 DIAGNOSIS — E1365 Other specified diabetes mellitus with hyperglycemia: Secondary | ICD-10-CM | POA: Diagnosis not present

## 2023-01-24 DIAGNOSIS — I70221 Atherosclerosis of native arteries of extremities with rest pain, right leg: Secondary | ICD-10-CM

## 2023-01-24 DIAGNOSIS — I70213 Atherosclerosis of native arteries of extremities with intermittent claudication, bilateral legs: Secondary | ICD-10-CM

## 2023-01-24 LAB — VAS US ABI WITH/WO TBI
Left ABI: 0.61
Right ABI: 0.62

## 2023-01-25 ENCOUNTER — Encounter
Admission: RE | Disposition: A | Discharge status: Home / Self Care | Payer: Self-pay | Source: Home / Self Care | Attending: Vascular Surgery

## 2023-01-25 ENCOUNTER — Encounter (INDEPENDENT_AMBULATORY_CARE_PROVIDER_SITE_OTHER): Payer: Self-pay | Admitting: Nurse Practitioner

## 2023-01-25 ENCOUNTER — Ambulatory Visit
Admission: RE | Admit: 2023-01-25 | Discharge: 2023-01-25 | Disposition: A | Discharge status: Home / Self Care | Payer: Medicare Other | Attending: Vascular Surgery | Admitting: Vascular Surgery

## 2023-01-25 ENCOUNTER — Other Ambulatory Visit (INDEPENDENT_AMBULATORY_CARE_PROVIDER_SITE_OTHER): Payer: Self-pay | Admitting: Nurse Practitioner

## 2023-01-25 ENCOUNTER — Other Ambulatory Visit: Payer: Self-pay

## 2023-01-25 ENCOUNTER — Encounter: Payer: Self-pay | Admitting: Vascular Surgery

## 2023-01-25 DIAGNOSIS — E782 Mixed hyperlipidemia: Secondary | ICD-10-CM | POA: Insufficient documentation

## 2023-01-25 DIAGNOSIS — I70221 Atherosclerosis of native arteries of extremities with rest pain, right leg: Secondary | ICD-10-CM

## 2023-01-25 DIAGNOSIS — Z7984 Long term (current) use of oral hypoglycemic drugs: Secondary | ICD-10-CM | POA: Insufficient documentation

## 2023-01-25 DIAGNOSIS — I70223 Atherosclerosis of native arteries of extremities with rest pain, bilateral legs: Secondary | ICD-10-CM

## 2023-01-25 DIAGNOSIS — I700 Atherosclerosis of aorta: Secondary | ICD-10-CM

## 2023-01-25 DIAGNOSIS — Z87891 Personal history of nicotine dependence: Secondary | ICD-10-CM | POA: Insufficient documentation

## 2023-01-25 DIAGNOSIS — I708 Atherosclerosis of other arteries: Secondary | ICD-10-CM | POA: Diagnosis not present

## 2023-01-25 DIAGNOSIS — E1151 Type 2 diabetes mellitus with diabetic peripheral angiopathy without gangrene: Secondary | ICD-10-CM | POA: Diagnosis present

## 2023-01-25 DIAGNOSIS — I998 Other disorder of circulatory system: Secondary | ICD-10-CM

## 2023-01-25 HISTORY — PX: LOWER EXTREMITY ANGIOGRAPHY: CATH118251

## 2023-01-25 LAB — GLUCOSE, CAPILLARY: Glucose-Capillary: 89 mg/dL (ref 70–99)

## 2023-01-25 LAB — CREATININE, SERUM
Creatinine, Ser: 1.29 mg/dL — ABNORMAL HIGH (ref 0.61–1.24)
GFR, Estimated: 57 mL/min — ABNORMAL LOW (ref >60)

## 2023-01-25 LAB — BUN: BUN: 37 mg/dL — ABNORMAL HIGH (ref 8–23)

## 2023-01-25 SURGERY — LOWER EXTREMITY ANGIOGRAPHY
Anesthesia: Moderate Sedation | Laterality: Right

## 2023-01-25 MED ORDER — HEPARIN SODIUM (PORCINE) 1000 UNIT/ML IJ SOLN
INTRAMUSCULAR | Status: DC | PRN
  Administered 2023-01-25: 6000 [IU] via INTRAVENOUS

## 2023-01-25 MED ORDER — MIDAZOLAM HCL 2 MG/ML PO SYRP: 8.0000 mg | ORAL_SOLUTION | Freq: Once | ORAL | Status: DC | PRN

## 2023-01-25 MED ORDER — SODIUM CHLORIDE 0.9% FLUSH: 3.0000 mL | Freq: Two times a day (BID) | INTRAVENOUS | Status: DC

## 2023-01-25 MED ORDER — SODIUM CHLORIDE 0.9 % IV SOLN: 250.0000 mL | INTRAVENOUS | Status: DC | PRN

## 2023-01-25 MED ORDER — METHYLPREDNISOLONE SODIUM SUCC 125 MG IJ SOLR: 125.0000 mg | Freq: Once | INTRAMUSCULAR | Status: DC | PRN

## 2023-01-25 MED ORDER — FENTANYL CITRATE (PF) 100 MCG/2ML IJ SOLN
INTRAMUSCULAR | Status: DC | PRN
  Administered 2023-01-25: 50 ug via INTRAVENOUS
  Administered 2023-01-25 (×2): 25 ug via INTRAVENOUS

## 2023-01-25 MED ORDER — CLOPIDOGREL BISULFATE 75 MG PO TABS: 75.0000 mg | ORAL_TABLET | Freq: Every day | ORAL | 11 refills | Status: DC

## 2023-01-25 MED ORDER — ONDANSETRON HCL 4 MG/2ML IJ SOLN: 4.0000 mg | Freq: Four times a day (QID) | INTRAMUSCULAR | Status: DC | PRN

## 2023-01-25 MED ORDER — OXYCODONE HCL 5 MG PO TABS: 5.0000 mg | ORAL_TABLET | ORAL | Status: DC | PRN

## 2023-01-25 MED ORDER — CEFAZOLIN SODIUM-DEXTROSE 2-4 GM/100ML-% IV SOLN
INTRAVENOUS | Status: AC
  Administered 2023-01-25: 2 g via INTRAVENOUS
  Filled 2023-01-25: qty 100

## 2023-01-25 MED ORDER — FENTANYL CITRATE (PF) 100 MCG/2ML IJ SOLN
INTRAMUSCULAR | Status: AC
  Filled 2023-01-25: qty 2

## 2023-01-25 MED ORDER — SODIUM CHLORIDE 0.9% FLUSH: 3.0000 mL | INTRAVENOUS | Status: DC | PRN

## 2023-01-25 MED ORDER — HYDROMORPHONE HCL 1 MG/ML IJ SOLN: 1.0000 mg | Freq: Once | INTRAMUSCULAR | Status: DC | PRN

## 2023-01-25 MED ORDER — ACETAMINOPHEN 325 MG PO TABS: 650.0000 mg | ORAL_TABLET | ORAL | Status: DC | PRN

## 2023-01-25 MED ORDER — SODIUM CHLORIDE 0.9 % IV SOLN: INTRAVENOUS | Status: DC

## 2023-01-25 MED ORDER — CLOPIDOGREL BISULFATE 300 MG PO TABS
300.0000 mg | ORAL_TABLET | ORAL | Status: AC
  Administered 2023-01-25: 300 mg via ORAL

## 2023-01-25 MED ORDER — CEFAZOLIN SODIUM-DEXTROSE 2-4 GM/100ML-% IV SOLN: 2.0000 g | INTRAVENOUS | Status: AC

## 2023-01-25 MED ORDER — FAMOTIDINE 20 MG PO TABS: 40.0000 mg | ORAL_TABLET | Freq: Once | ORAL | Status: DC | PRN

## 2023-01-25 MED ORDER — CLOPIDOGREL BISULFATE 75 MG PO TABS
ORAL_TABLET | ORAL | Status: AC
  Filled 2023-01-25: qty 4

## 2023-01-25 MED ORDER — MIDAZOLAM HCL 2 MG/2ML IJ SOLN
INTRAMUSCULAR | Status: DC | PRN
  Administered 2023-01-25: 1 mg via INTRAVENOUS
  Administered 2023-01-25: 2 mg via INTRAVENOUS
  Administered 2023-01-25: 1 mg via INTRAVENOUS

## 2023-01-25 MED ORDER — MORPHINE SULFATE (PF) 4 MG/ML IV SOLN: 2.0000 mg | INTRAVENOUS | Status: DC | PRN

## 2023-01-25 MED ORDER — HEPARIN SODIUM (PORCINE) 1000 UNIT/ML IJ SOLN
INTRAMUSCULAR | Status: AC
  Filled 2023-01-25: qty 10

## 2023-01-25 MED ORDER — IODIXANOL 320 MG/ML IV SOLN
INTRAVENOUS | Status: DC | PRN
  Administered 2023-01-25: 68 mL via INTRA_ARTERIAL

## 2023-01-25 MED ORDER — DIPHENHYDRAMINE HCL 50 MG/ML IJ SOLN: 50.0000 mg | Freq: Once | INTRAMUSCULAR | Status: DC | PRN

## 2023-01-25 MED ORDER — LABETALOL HCL 5 MG/ML IV SOLN: 10.0000 mg | INTRAVENOUS | Status: DC | PRN

## 2023-01-25 MED ORDER — HYDRALAZINE HCL 20 MG/ML IJ SOLN: 5.0000 mg | INTRAMUSCULAR | Status: DC | PRN

## 2023-01-25 MED ORDER — MIDAZOLAM HCL 2 MG/2ML IJ SOLN
INTRAMUSCULAR | Status: AC
  Filled 2023-01-25: qty 2

## 2023-01-25 SURGICAL SUPPLY — 28 items
BALLN LUTONIX DCB 5X60X130 (BALLOONS) ×1
BALLN ULTRVRSE 9X40X75C (BALLOONS) ×1
BALLN ULTRVRSE 9X60X75 (BALLOONS) ×1
BALLOON LUTONIX DCB 5X60X130 (BALLOONS) IMPLANT
BALLOON ULTRVRSE 9X40X75C (BALLOONS) IMPLANT
BALLOON ULTRVRSE 9X60X75 (BALLOONS) IMPLANT
CATH ANGIO 5F PIGTAIL 65CM (CATHETERS) IMPLANT
CATH BEACON 5 .035 40 KMP TP (CATHETERS) IMPLANT
CATH BEACON 5 .038 40 KMP TP (CATHETERS) ×1
COVER PROBE ULTRASOUND 5X96 (MISCELLANEOUS) IMPLANT
DEVICE STARCLOSE SE CLOSURE (Vascular Products) IMPLANT
GLIDEWIRE ADV .035X180CM (WIRE) IMPLANT
GOWN STRL REUS W/ TWL LRG LVL3 (GOWN DISPOSABLE) ×1 IMPLANT
GOWN STRL REUS W/TWL LRG LVL3 (GOWN DISPOSABLE) ×1
GUIDEWIRE ADV .018X180CM (WIRE) IMPLANT
INTRODUCER 7FR 23CM (INTRODUCER) IMPLANT
KIT ENCORE 26 ADVANTAGE (KITS) IMPLANT
NDL ENTRY 21GA 7CM ECHOTIP (NEEDLE) IMPLANT
NEEDLE ENTRY 21GA 7CM ECHOTIP (NEEDLE) ×1 IMPLANT
PACK ANGIOGRAPHY (CUSTOM PROCEDURE TRAY) ×1 IMPLANT
SET INTRO CAPELLA COAXIAL (SET/KITS/TRAYS/PACK) IMPLANT
SHEATH BRITE TIP 6FRX11 (SHEATH) IMPLANT
STENT LIFESTREAM 8X26X80 (Permanent Stent) IMPLANT
STENT LIFESTREAM 8X58X80 (Permanent Stent) IMPLANT
SYR MEDRAD MARK 7 150ML (SYRINGE) IMPLANT
TUBING CONTRAST HIGH PRESS 72 (TUBING) IMPLANT
WIRE GUIDERIGHT .035X150 (WIRE) IMPLANT
WIRE SUPRACORE 190CM (MISCELLANEOUS) IMPLANT

## 2023-01-25 NOTE — Interval H&P Note (Signed)
History and Physical Interval Note:  01/25/2023 8:09 AM  Victor Rojas  has presented today for surgery, with the diagnosis of Atherosclerosis obliterans with rest pain.  The various methods of treatment have been discussed with the patient and family. After consideration of risks, benefits and other options for treatment, the patient has consented to  Procedure(s): Lower Extremity Angiography (Right) as a surgical intervention.  The patient's history has been reviewed, patient examined, no change in status, stable for surgery.  I have reviewed the patient's chart and labs.  Questions were answered to the patient's satisfaction.     [name]

## 2023-01-25 NOTE — H&P (View-Only) (Signed)
Subjective:    Patient ID: Victor Rojas, male    DOB: 23-Apr-1946, 77 y.o.   MRN: [id number] Chief Complaint  Patient presents with   Follow-up    Ultra sound follow up  work in per FB patients states he is having some tingling and cold feet    Victor Rojas is a 77 year old male that presents to day urgently due to noting worsening pain and numbness in his right lower extremity.  The patient notes that overall he has been having extensive worsening numbness in his bilateral lower extremities the right has suddenly become worse.  He notes that is very numb and cold it has affected his ability to walk.  Patient has had known peripheral arterial disease in his lower extremities for years and a study in 2021 showed a greater than 50% stenosis of his bilateral iliac arteries.  At that time the patient was hesitant to undergo any intervention and he has declined offers intervention at his follow-up study since that time.  He currently denies any open wounds or ulcerations.  He also endorses having some restless leg symptoms.  Today noninvasive studies show an ABI 0.62 on the right and 0.61 on the left.  Previously the ABI was 0.59 on the right and 0.68 on the left.  Today the TBI for the right lower extremity is 0.  He has monophasic tibial artery waveforms bilaterally with flat toe waveforms on the right.    Review of Systems  Neurological:  Positive for numbness.  All other systems reviewed and are negative.      Objective:   Physical Exam Vitals reviewed.  HENT:     Head: Normocephalic.  Cardiovascular:     Rate and Rhythm: Normal rate.     Pulses:          Dorsalis pedis pulses are 0 on the right side and detected w/ Doppler on the left side.       Posterior tibial pulses are 0 on the right side and detected w/ Doppler on the left side.  Pulmonary:     Effort: Pulmonary effort is normal.  Skin:    General: Skin is warm and dry.  Neurological:     Mental Status: He is alert  and oriented to person, place, and time.     Gait: Gait abnormal.  Psychiatric:        Mood and Affect: Mood normal.        Behavior: Behavior normal.        Thought Content: Thought content normal.        Judgment: Judgment normal.     BP 102/66 (BP Location: Left Arm)   Pulse (!) 105   Resp 18   Ht 5' 8"$  (1.727 m)   Wt 196 lb 12.8 oz (89.3 kg)   BMI 29.92 kg/m   Past Medical History:  Diagnosis Date   Adenoma of colon    Arthritis    Diabetes mellitus without complication (HCC)    Diverticulosis    GERD (gastroesophageal reflux disease)    Hiatal hernia    Hiatal hernia    History of gout    History of kidney stones    Hyperlipemia    Hypertension    Peripheral vascular disease ([place])    Pre-diabetes     Social History   Socioeconomic History   Marital status: Married    Spouse name: Not on file   Number of children: Not on file  Years of education: Not on file   Highest education level: Not on file  Occupational History   Not on file  Tobacco Use   Smoking status: Former    Types: Cigarettes    Quit date: 02/03/1989    Years since quitting: 33.9   Smokeless tobacco: Never  Vaping Use   Vaping Use: Never used  Substance and Sexual Activity   Alcohol use: Yes    Alcohol/week: 6.0 standard drinks of alcohol    Types: 6 Cans of beer per week   Drug use: No   Sexual activity: Not on file  Other Topics Concern   Not on file  Social History Narrative   Not on file   Social Determinants of Health   Financial Resource Strain: Not on file  Food Insecurity: No Food Insecurity (01/18/2023)   Hunger Vital Sign    Worried About Running Out of Food in the Last Year: Never true    Ran Out of Food in the Last Year: Never true  Transportation Needs: No Transportation Needs (01/18/2023)   PRAPARE - Hydrologist (Medical): No    Lack of Transportation (Non-Medical): No  Physical Activity: Not on file  Stress: Not on file  Social  Connections: Not on file  Intimate Partner Violence: Not At Risk (01/18/2023)   Humiliation, Afraid, Rape, and Kick questionnaire    Fear of Current or Ex-Partner: No    Emotionally Abused: No    Physically Abused: No    Sexually Abused: No    Past Surgical History:  Procedure Laterality Date   BILIARY DILATION  01/17/2023   Procedure: BILIARY DILATION;  Surgeon: [name], MD;  Location: [place];  Service: Gastroenterology;;   COLONOSCOPY     COLONOSCOPY WITH PROPOFOL N/A 06/18/2016   Procedure: COLONOSCOPY WITH PROPOFOL;  Surgeon: Lollie Sails, MD;  Location: [facility] ENDOSCOPY;  Service: Endoscopy;  Laterality: N/A;   COLONOSCOPY WITH PROPOFOL N/A 01/09/2021   Procedure: COLONOSCOPY WITH PROPOFOL;  Surgeon: Lesly Rubenstein, MD;  Location: [facility] ENDOSCOPY;  Service: Endoscopy;  Laterality: N/A;   ENDARTERECTOMY Right 12/19/2018   Procedure: ENDARTERECTOMY CAROTID;  Surgeon: Katha Cabal, MD;  Location: [facility] ORS;  Service: Vascular;  Laterality: Right;   ENDOSCOPIC RETROGRADE CHOLANGIOPANCREATOGRAPHY (ERCP) WITH PROPOFOL N/A 01/17/2023   Procedure: ENDOSCOPIC RETROGRADE CHOLANGIOPANCREATOGRAPHY (ERCP) WITH PROPOFOL;  Surgeon: [name], MD;  Location[facility] END[facility]COPY;  Service: Gastroenterology;  Laterality: N/A;   ESOPHAGOGASTRODUODENOSCOPY (EGD) WITH PROPOFOL N/A 06/18/2016   Procedure: ESOPHAGOGASTRODUODENOSCOPY (EGD) WITH PROPOFOL;  Surgeon: Lollie Sails, MD;  Location: [facility] ENDOSCOPY;  Service: Endoscopy;  Laterality: N/A;   ESOPHAGOGASTRODUODENOSCOPY (EGD) WITH PROPOFOL N/A 01/09/2021   Procedure: ESOPHAGOGASTRODUODENOSCOPY (EGD) WITH PROPOFOL;  Surgeon: Lesly Rubenstein, MD;  Location: [facility] ENDOSCOPY;  Service: Endoscopy;  Laterality: N/A;   HERNIA REPAIR     umbilical   REMOVAL OF STONES  01/17/2023   Procedure: REMOVAL OF STONES;  Surgeon: [name], MD;  Location: [place];  Service: Gastroenterology;;   Joan Mayans  01/17/2023   Procedure:  Joan Mayans;  Surgeon: [name], MD;  Location: [facility] ENDOSCOPY;  Service: Gastroenterology;;   TONSILLECTOMY      Family History  Problem Relation Age of Onset   Cataracts Mother    Heart attack Mother    Cancer Sister     No Known Allergies     Latest Ref Rng & Units 01/21/2023    5:49 AM 01/19/2023    7:10 AM 01/18/2023  12:21 AM  CBC  WBC 4.0 - 10.5 K/uL 9.7  12.7  5.7   Hemoglobin 13.0 - 17.0 g/dL 14.3  13.8  11.6   Hematocrit 39.0 - 52.0 % 42.3  38.9  35.0   Platelets 150 - 400 K/uL 98  102  76       CMP     Component Value Date/Time   NA 136 01/21/2023 0549   K 4.4 01/21/2023 0549   CL 102 01/21/2023 0549   CO2 23 01/21/2023 0549   GLUCOSE 141 (H) 01/21/2023 0549   BUN 34 (H) 01/21/2023 0549   CREATININE 1.06 01/21/2023 0549   CALCIUM 9.0 01/21/2023 0549   PROT 6.3 (L) 01/21/2023 0549   ALBUMIN 2.8 (L) 01/21/2023 0549   AST 96 (H) 01/21/2023 0549   ALT 270 (H) 01/21/2023 0549   ALKPHOS 236 (H) 01/21/2023 0549   BILITOT 2.4 (H) 01/21/2023 0549   GFRNONAA >60 01/21/2023 0549   GFRAA >60 12/20/2018 0429     VAS [place] ABI WITH/WO TBI  Result Date: 01/24/2023  LOWER EXTREMITY DOPPLER STUDY Patient Name:  Victor Rojas  Date of Exam:   01/24/2023 Medical Rec #: [id number]         Accession #:    [id number] Date of Birth: December 27, 1945         Patient Gender: M Patient Age:   51 years Exam Location:  [facility] Procedure:      VAS [place] ABI WITH/WO TBI Referring Phys: [name] --------------------------------------------------------------------------------  Indications: Claudication, and peripheral artery disease. High Risk Factors: Hypertension, Diabetes, past history of smoking.  Performing Technologist: Delorise Shiner RVT  Examination Guidelines: A complete evaluation includes at minimum, Doppler waveform signals and systolic blood pressure reading at the level of bilateral brachial, anterior tibial, and posterior tibial arteries, when vessel  segments are accessible. Bilateral testing is considered an integral part of a complete examination. Photoelectric Plethysmograph (PPG) waveforms and toe systolic pressure readings are included as required and additional duplex testing as needed. Limited examinations for reoccurring indications may be performed as noted.  ABI Findings: +---------+------------------+-----+----------+--------+ Right    Rt Pressure (mmHg)IndexWaveform  Comment  +---------+------------------+-----+----------+--------+ Brachial 128                                       +---------+------------------+-----+----------+--------+ PTA      79                0.62 monophasic         +---------+------------------+-----+----------+--------+ DP       76                0.59 monophasic         +---------+------------------+-----+----------+--------+ Great Toe0                 0.00                    +---------+------------------+-----+----------+--------+ +---------+------------------+-----+----------+-------+ Left     Lt Pressure (mmHg)IndexWaveform  Comment +---------+------------------+-----+----------+-------+ Brachial 121                                      +---------+------------------+-----+----------+-------+ PTA      78                0.61 monophasic        +---------+------------------+-----+----------+-------+  DP       78                0.61 monophasic        +---------+------------------+-----+----------+-------+ Great Toe0                 0.00                   +---------+------------------+-----+----------+-------+ +-------+-----------+-----------+------------+------------+ ABI/TBIToday's ABIToday's TBIPrevious ABIPrevious TBI +-------+-----------+-----------+------------+------------+ Right  0.62       0.000      0.59        0.57         +-------+-----------+-----------+------------+------------+ Left   0.61       0.51       0.68        0.86          +-------+-----------+-----------+------------+------------+ Prior ABIs on 03/2021 were 0.66 on the right and 0.68 on the left. Right ABIs appear essentially unchanged compared to prior study on 09/25/2021. Left ABIs appear decreased compared to prior study on 09/25/2021.  Summary: Right: Resting right ankle-brachial index indicates moderate right lower extremity arterial disease. The right toe-brachial index is abnormal. Left: Resting left ankle-brachial index indicates moderate left lower extremity arterial disease. The left toe-brachial index is abnormal. *See table(s) above for measurements and observations.  Electronically signed by [name] MD on 01/24/2023 at 12:42:59 PM.    Final        Assessment & Plan:   1. Atherosclerosis of native artery of right lower extremity with rest pain (HCC) Recommend:  The patient has evidence of severe atherosclerotic changes of both lower extremities with rest pain that is associated with preulcerative changes and impending tissue loss of the right foot.  This represents a limb threatening ischemia and places the patient at the risk for right limb loss.  Patient should undergo angiography of the right lower extremity with the hope for intervention for limb salvage.  The risks and benefits as well as the alternative therapies was discussed in detail with the patient.  All questions were answered.  Patient agrees to proceed with right lower extremity angiography.  The patient will follow up with me in the office after the procedure.      2. Controlled other specified diabetes mellitus with hyperglycemia, unspecified whether long term insulin use ([place]) Continue hypoglycemic medications as already ordered, these medications have been reviewed and there are no changes at this time.  Hgb A1C to be monitored as already arranged by primary service  3. Hyperlipidemia, mixed Continue statin as ordered and reviewed, no changes at this time   Current  Outpatient Medications on File Prior to Visit  Medication Sig Dispense Refill   allopurinol (ZYLOPRIM) 300 MG tablet Take 300 mg by mouth daily.     amLODipine (NORVASC) 5 MG tablet Take 1 tablet (5 mg total) by mouth daily. 90 tablet 0   aspirin EC 81 MG tablet Take 81 mg by mouth daily.      [START ON 01/28/2023] atorvastatin (LIPITOR) 40 MG tablet Take 1 tablet (40 mg total) by mouth daily.     Cholecalciferol (D3-1000) 25 MCG (1000 UT) tablet Take 1,000 Units by mouth daily.     lisinopril-hydrochlorothiazide (ZESTORETIC) 20-12.5 MG tablet Take 1 tablet by mouth 2 (two) times daily.     metFORMIN (GLUCOPHAGE) 500 MG tablet Take 500 mg by mouth 2 (two) times daily.     metoprolol tartrate (LOPRESSOR) 25 MG tablet Take 25 mg by  mouth 2 (two) times daily.     Multiple Vitamin (MULTI-VITAMINS) TABS Take 1 tablet by mouth daily.      pantoprazole (PROTONIX) 40 MG tablet Take 40 mg by mouth daily.     vitamin C (ASCORBIC ACID) 500 MG tablet Take 500 mg by mouth daily.     No current facility-administered medications on file prior to visit.    There are no Patient Instructions on file for this visit. No follow-ups on file.   Kris Hartmann, NP

## 2023-01-25 NOTE — Op Note (Signed)
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  Abdominal aortogram  2.  Bilateral distal runoff  3.  Percutaneous transluminal angioplasty and stent placement right common iliac artery; "kissing balloon" technique  4.  Percutaneous transluminal and plasty and stent placement left common iliac artery; "kissing balloon" technique  5.  Ultrasound guided access bilateral common femoral arteries  6.  StarClose closure device bilateral common femoral arteries  Anesthesia: Conscious sedation was administered under my direct supervision by the interventional radiology RN. IV Versed plus fentanyl were utilized. Continuous ECG, pulse oximetry and blood pressure was monitored throughout the entire procedure. Conscious sedation was for a total of 68 minutes.  Sheath: Bilateral 23 cm 7 French Pinnacle sheaths retrograde common femoral arteries  Contrast: 85 cc  Fluoroscopy Time: 8.9 minutes  Indications: Patient presents with increasing rest pain of both lower extremities.  Physical examination as well as noninvasive studies demonstrate significant atherosclerotic occlusive disease with flatline tracings at the ankle and toe level indicating significant ischemia.  Angiography with hope for intervention has been recommended for limb salvage.  Risks and benefits have been reviewed all questions have been answered alternative therapies have also been discussed patient has agreed to proceed.  Procedure:  MAN MOM a 77 y.o. male who was identified and appropriate procedural time out was performed.  The patient was then placed supine on the table and prepped and draped in the usual sterile fashion.   Ultrasound was used to evaluate the left common femoral artery.  It was echolucent and pulsatile indicating it is patent .  An ultrasound image was acquired for the permanent record.  A micropuncture needle was used to access the left common femoral artery under direct ultrasound guidance.  The microwire was then advanced under fluoroscopic guidance without difficulty followed by the micro-sheath  A 0.035 J wire was advanced without resistance and a 6Fr sheath was placed.    The pigtail catheter was then positioned at the level of T12 and an AP image of the aorta was obtained. After review the images the pigtail catheter was repositioned above the aortic bifurcation and bilateral oblique views of the pelvis were obtained.   After review the images the ultrasound was reprepped and delivered back onto the sterile field. The right common femoral was then imaged with the ultrasound it was noted to be echolucent and pulsatile indicating patency. Images recorded for the permanent record. Under real-time visualization a microneedle was inserted into the anterior wall the common femoral artery microwire was then advanced without difficulty under fluoroscopic guidance followed by placement of the micro-sheath.  A advantage wire was then negotiated under fluoroscopic guidance into the aorta.  7 French 23 cm sheath was then placed.  6000 units of heparin was given and allowed to circulate for proximally 4 minutes.  The left sheath was then upsized to a 7 [place] 23 cm sheath as well after a supra core wire was advanced through the pigtail catheter. Magnified images of the aortic bifurcation were then made using hand injection contrast from the femoral sheaths. After appropriate sizing an 8 mm x 58 mm Lifestream stent was selected for the right and a 8 mm x 58 mm Lifestream stent was selected for the left. There were then advanced and positioned  just above the aortic bifurcation. Insufflation for full expansion of the  stents was performed simultaneously. Follow-up imaging was then performed in the LAO projection and 8 mm x 26 mm Lifestream stent was added distally so that the common iliac was now covered down to the iliac bifurcation.  The detector was then positioned in the RAO view and hand-injection through the left sheath was performed.  This demonstrated the stent was in good position but undersized distally.  I selected a 9 mm x 60 mm Ultraverse balloon for the left and a 9 mm x 40 mm Ultraverse balloon for the right there were then positioned within both stents left and right respectively.  Inflation was to 8 to 10 atm for approximately 30 seconds.  Follow-up imaging from the left sheath demonstrated apposition of the stent to the wall with excellent coverage and less than 10% residual stenosis bilaterally.  Hand-injection of contrast was then performed through the left sheath and then the right sheath to obtain distal runoff bilaterally.  Oblique views were then obtained of the groins in succession and Star close device is deployed without difficulty. There were no immediate complications   Findings:   Aortogram:  The abdominal aorta is opacified with a bolus injection contrast. Demonstrates diffuse disease but there are no hemodynamically significant lesions noted until the distal aortic bifurcation where bilateral greater than 90% ostial right iliac and greater than 70% ostial left iliac lesions are identified.  On the right the plaque extends all the way to the iliac bifurcation and there is greater than 80% stenosis throughout the entire common iliac.  On the left the plaque extends to approximately 10 mm proximal to the bifurcation of the iliac arteries.  And there is greater than 70% stenosis throughout.  There is significant poststenotic dilatation noted of the common iliac arteries as well.  The external iliac arteries are mildly diseased and there are no hemodynamically significant stenoses.  Right Lower  Extremity: The right common femoral artery is functionally occluded secondary to a huge amount of bulky calcific plaque.  The SFA is occluded down to Hunter's canal where the above-knee popliteal is reconstituted.  The profunda femoris is patent just distal to its origin and reconstitutes the popliteal.  The popliteal appears to be patent throughout its course without hemodynamically significant stenosis and there is faint filling of the trifurcation.  The posterior tibial appears patent to almost the ankle at which point the contrast is so dilute it is nonvisualized.  Peroneal and posterior tibial are poorly visualized at this level but at least appear to be patent in their proximal halves.  Left Lower Extremity: The left common femoral demonstrates diffuse greater than 75% stenosis.  Distally it may be occluded.  The origin of the profunda femoris is occluded as is the origin of the SFA.  Profunda femoris however distal to the ostial lesion is widely patent although diffusely diseased there are extensive collaterals that reconstitute the above-knee popliteal.  Similar to the right side the popliteal appears to be patent and there is clearly patency of the trifurcation with three-vessel runoff down to the distal calf level.  Below that level the contrast is to dilute to visualize the vasculature.    Following placement of the iliac stents there is now wide patency with less than 10% residual stenosis with rapid flow through the aortic bifurcation bilaterally.  Summary:  Successful reconstruction of the distal aorta and bilateral iliac arteries.  Therefore, aortoiliac inflow has been reestablished.  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  There is reconstitution of the above-knee popliteal bilaterally with what appears to be three-vessel runoff at least to the distal calf area.  Recommend: The patient should undergo bilateral femoral endarterectomies with above-knee  popliteal bypass.  He will require cardiac clearance and I will discuss this with him when he returns to the office.  In the meantime I am hopeful that the iliac interventions will provide significant improvement in his inflow and therefore significant improvement in his rest pain  Disposition: Patient was taken to the recovery room in stable condition having tolerated the procedure well.  Belenda Cruise Vaughn Beaumier 01/25/2023,9:41 AM

## 2023-01-25 NOTE — Progress Notes (Signed)
Subjective:    Patient ID: Victor Rojas, male    DOB: 1946/05/31, 77 y.o.   MRN: [id number] Chief Complaint  Patient presents with   Follow-up    Ultra sound follow up  work in per FB patients states he is having some tingling and cold feet    Victor Rojas is a 77 year old male that presents to day urgently due to noting worsening pain and numbness in his right lower extremity.  The patient notes that overall he has been having extensive worsening numbness in his bilateral lower extremities the right has suddenly become worse.  He notes that is very numb and cold it has affected his ability to walk.  Patient has had known peripheral arterial disease in his lower extremities for years and a study in 2021 showed a greater than 50% stenosis of his bilateral iliac arteries.  At that time the patient was hesitant to undergo any intervention and he has declined offers intervention at his follow-up study since that time.  He currently denies any open wounds or ulcerations.  He also endorses having some restless leg symptoms.  Today noninvasive studies show an ABI 0.62 on the right and 0.61 on the left.  Previously the ABI was 0.59 on the right and 0.68 on the left.  Today the TBI for the right lower extremity is 0.  He has monophasic tibial artery waveforms bilaterally with flat toe waveforms on the right.    Review of Systems  Neurological:  Positive for numbness.  All other systems reviewed and are negative.      Objective:   Physical Exam Vitals reviewed.  HENT:     Head: Normocephalic.  Cardiovascular:     Rate and Rhythm: Normal rate.     Pulses:          Dorsalis pedis pulses are 0 on the right side and detected w/ Doppler on the left side.       Posterior tibial pulses are 0 on the right side and detected w/ Doppler on the left side.  Pulmonary:     Effort: Pulmonary effort is normal.  Skin:    General: Skin is warm and dry.  Neurological:     Mental Status: He is alert  and oriented to person, place, and time.     Gait: Gait abnormal.  Psychiatric:        Mood and Affect: Mood normal.        Behavior: Behavior normal.        Thought Content: Thought content normal.        Judgment: Judgment normal.     BP 102/66 (BP Location: Left Arm)   Pulse (!) 105   Resp 18   Ht 5' 8"$  (1.727 m)   Wt 196 lb 12.8 oz (89.3 kg)   BMI 29.92 kg/m   Past Medical History:  Diagnosis Date   Adenoma of colon    Arthritis    Diabetes mellitus without complication (HCC)    Diverticulosis    GERD (gastroesophageal reflux disease)    Hiatal hernia    Hiatal hernia    History of gout    History of kidney stones    Hyperlipemia    Hypertension    Peripheral vascular disease ([place])    Pre-diabetes     Social History   Socioeconomic History   Marital status: Married    Spouse name: Not on file   Number of children: Not on file  Years of education: Not on file   Highest education level: Not on file  Occupational History   Not on file  Tobacco Use   Smoking status: Former    Types: Cigarettes    Quit date: 02/03/1989    Years since quitting: 33.9   Smokeless tobacco: Never  Vaping Use   Vaping Use: Never used  Substance and Sexual Activity   Alcohol use: Yes    Alcohol/week: 6.0 standard drinks of alcohol    Types: 6 Cans of beer per week   Drug use: No   Sexual activity: Not on file  Other Topics Concern   Not on file  Social History Narrative   Not on file   Social Determinants of Health   Financial Resource Strain: Not on file  Food Insecurity: No Food Insecurity (01/18/2023)   Hunger Vital Sign    Worried About Running Out of Food in the Last Year: Never true    Ran Out of Food in the Last Year: Never true  Transportation Needs: No Transportation Needs (01/18/2023)   PRAPARE - Hydrologist (Medical): No    Lack of Transportation (Non-Medical): No  Physical Activity: Not on file  Stress: Not on file  Social  Connections: Not on file  Intimate Partner Violence: Not At Risk (01/18/2023)   Humiliation, Afraid, Rape, and Kick questionnaire    Fear of Current or Ex-Partner: No    Emotionally Abused: No    Physically Abused: No    Sexually Abused: No    Past Surgical History:  Procedure Laterality Date   BILIARY DILATION  01/17/2023   Procedure: BILIARY DILATION;  Surgeon: [name], MD;  Location: [place];  Service: Gastroenterology;;   COLONOSCOPY     COLONOSCOPY WITH PROPOFOL N/A 06/18/2016   Procedure: COLONOSCOPY WITH PROPOFOL;  Surgeon: Lollie Sails, MD;  Location: [facility] ENDOSCOPY;  Service: Endoscopy;  Laterality: N/A;   COLONOSCOPY WITH PROPOFOL N/A 01/09/2021   Procedure: COLONOSCOPY WITH PROPOFOL;  Surgeon: Lesly Rubenstein, MD;  Location: [facility] ENDOSCOPY;  Service: Endoscopy;  Laterality: N/A;   ENDARTERECTOMY Right 12/19/2018   Procedure: ENDARTERECTOMY CAROTID;  Surgeon: Katha Cabal, MD;  Location: [facility] ORS;  Service: Vascular;  Laterality: Right;   ENDOSCOPIC RETROGRADE CHOLANGIOPANCREATOGRAPHY (ERCP) WITH PROPOFOL N/A 01/17/2023   Procedure: ENDOSCOPIC RETROGRADE CHOLANGIOPANCREATOGRAPHY (ERCP) WITH PROPOFOL;  Surgeon: [name], MD;  Location: [facility] ENDOSCOPY;  Service: Gastroenterology;  Laterality: N/A;   ESOPHAGOGASTRODUODENOSCOPY (EGD) WITH PROPOFOL N/A 06/18/2016   Procedure: ESOPHAGOGASTRODUODENOSCOPY (EGD) WITH PROPOFOL;  Surgeon: Lollie Sails, MD;  Location: [facility] ENDOSCOPY;  Service: Endoscopy;  Laterality: N/A;   ESOPHAGOGASTRODUODENOSCOPY (EGD) WITH PROPOFOL N/A 01/09/2021   Procedure: ESOPHAGOGASTRODUODENOSCOPY (EGD) WITH PROPOFOL;  Surgeon: Lesly Rubenstein, MD;  Location: [facility] ENDOSCOPY;  Service: Endoscopy;  Laterality: N/A;   HERNIA REPAIR     umbilical   REMOVAL OF STONES  01/17/2023   Procedure: REMOVAL OF STONES;  Surgeon: [name], MD;  Location: [place];  Service: Gastroenterology;;   Joan Mayans  01/17/2023   Procedure:  Joan Mayans;  Surgeon: [name], MD;  Location: [facility] ENDOSCOPY;  Service: Gastroenterology;;   TONSILLECTOMY      Family History  Problem Relation Age of Onset   Cataracts Mother    Heart attack Mother    Cancer Sister     No Known Allergies     Latest Ref Rng & Units 01/21/2023    5:49 AM 01/19/2023    7:10 AM 01/18/2023  12:21 AM  CBC  WBC 4.0 - 10.5 K/uL 9.7  12.7  5.7   Hemoglobin 13.0 - 17.0 g/dL 14.3  13.8  11.6   Hematocrit 39.0 - 52.0 % 42.3  38.9  35.0   Platelets 150 - 400 K/uL 98  102  76       CMP     Component Value Date/Time   NA 136 01/21/2023 0549   K 4.4 01/21/2023 0549   CL 102 01/21/2023 0549   CO2 23 01/21/2023 0549   GLUCOSE 141 (H) 01/21/2023 0549   BUN 34 (H) 01/21/2023 0549   CREATININE 1.06 01/21/2023 0549   CALCIUM 9.0 01/21/2023 0549   PROT 6.3 (L) 01/21/2023 0549   ALBUMIN 2.8 (L) 01/21/2023 0549   AST 96 (H) 01/21/2023 0549   ALT 270 (H) 01/21/2023 0549   ALKPHOS 236 (H) 01/21/2023 0549   BILITOT 2.4 (H) 01/21/2023 0549   GFRNONAA >60 01/21/2023 0549   GFRAA >60 12/20/2018 0429     VAS [place] ABI WITH/WO TBI  Result Date: 01/24/2023  LOWER EXTREMITY DOPPLER STUDY Patient Name:  DAREY WEAKLEY  Date of Exam:   01/24/2023 Medical Rec #: [id number]         Accession #:    [id number] Date of Birth: [birth year]         Patient Gender: M Patient Age:   76 years Exam Location:  [facility] Procedure:      VAS [place] ABI WITH/WO TBI Referring Phys: [name] --------------------------------------------------------------------------------  Indications: Claudication, and peripheral artery disease. High Risk Factors: Hypertension, Diabetes, past history of smoking.  Performing Technologist: Delorise Shiner RVT  Examination Guidelines: A complete evaluation includes at minimum, Doppler waveform signals and systolic blood pressure reading at the level of bilateral brachial, anterior tibial, and posterior tibial arteries, when vessel  segments are accessible. Bilateral testing is considered an integral part of a complete examination. Photoelectric Plethysmograph (PPG) waveforms and toe systolic pressure readings are included as required and additional duplex testing as needed. Limited examinations for reoccurring indications may be performed as noted.  ABI Findings: +---------+------------------+-----+----------+--------+ Right    Rt Pressure (mmHg)IndexWaveform  Comment  +---------+------------------+-----+----------+--------+ Brachial 128                                       +---------+------------------+-----+----------+--------+ PTA      79                0.62 monophasic         +---------+------------------+-----+----------+--------+ DP       76                0.59 monophasic         +---------+------------------+-----+----------+--------+ Great Toe0                 0.00                    +---------+------------------+-----+----------+--------+ +---------+------------------+-----+----------+-------+ Left     Lt Pressure (mmHg)IndexWaveform  Comment +---------+------------------+-----+----------+-------+ Brachial 121                                      +---------+------------------+-----+----------+-------+ PTA      78                0.61 monophasic        +---------+------------------+-----+----------+-------+  DP       78                0.61 monophasic        +---------+------------------+-----+----------+-------+ Great Toe0                 0.00                   +---------+------------------+-----+----------+-------+ +-------+-----------+-----------+------------+------------+ ABI/TBIToday's ABIToday's TBIPrevious ABIPrevious TBI +-------+-----------+-----------+------------+------------+ Right  0.62       0.000      0.59        0.57         +-------+-----------+-----------+------------+------------+ Left   0.61       0.51       0.68        0.86          +-------+-----------+-----------+------------+------------+ Prior ABIs on 03/2021 were 0.66 on the right and 0.68 on the left. Right ABIs appear essentially unchanged compared to prior study on 09/25/2021. Left ABIs appear decreased compared to prior study on 09/25/2021.  Summary: Right: Resting right ankle-brachial index indicates moderate right lower extremity arterial disease. The right toe-brachial index is abnormal. Left: Resting left ankle-brachial index indicates moderate left lower extremity arterial disease. The left toe-brachial index is abnormal. *See table(s) above for measurements and observations.  Electronically signed by [name] MD on 01/24/2023 at 12:42:59 PM.    Final        Assessment & Plan:   1. Atherosclerosis of native artery of right lower extremity with rest pain (HCC) Recommend:  The patient has evidence of severe atherosclerotic changes of both lower extremities with rest pain that is associated with preulcerative changes and impending tissue loss of the right foot.  This represents a limb threatening ischemia and places the patient at the risk for right limb loss.  Patient should undergo angiography of the right lower extremity with the hope for intervention for limb salvage.  The risks and benefits as well as the alternative therapies was discussed in detail with the patient.  All questions were answered.  Patient agrees to proceed with right lower extremity angiography.  The patient will follow up with me in the office after the procedure.      2. Controlled other specified diabetes mellitus with hyperglycemia, unspecified whether long term insulin use ([place]) Continue hypoglycemic medications as already ordered, these medications have been reviewed and there are no changes at this time.  Hgb A1C to be monitored as already arranged by primary service  3. Hyperlipidemia, mixed Continue statin as ordered and reviewed, no changes at this time   Current  Outpatient Medications on File Prior to Visit  Medication Sig Dispense Refill   allopurinol (ZYLOPRIM) 300 MG tablet Take 300 mg by mouth daily.     amLODipine (NORVASC) 5 MG tablet Take 1 tablet (5 mg total) by mouth daily. 90 tablet 0   aspirin EC 81 MG tablet Take 81 mg by mouth daily.      [START ON 01/28/2023] atorvastatin (LIPITOR) 40 MG tablet Take 1 tablet (40 mg total) by mouth daily.     Cholecalciferol (D3-1000) 25 MCG (1000 UT) tablet Take 1,000 Units by mouth daily.     lisinopril-hydrochlorothiazide (ZESTORETIC) 20-12.5 MG tablet Take 1 tablet by mouth 2 (two) times daily.     metFORMIN (GLUCOPHAGE) 500 MG tablet Take 500 mg by mouth 2 (two) times daily.     metoprolol tartrate (LOPRESSOR) 25 MG tablet Take 25 mg by  mouth 2 (two) times daily.     Multiple Vitamin (MULTI-VITAMINS) TABS Take 1 tablet by mouth daily.      pantoprazole (PROTONIX) 40 MG tablet Take 40 mg by mouth daily.     vitamin C (ASCORBIC ACID) 500 MG tablet Take 500 mg by mouth daily.     No current facility-administered medications on file prior to visit.    There are no Patient Instructions on file for this visit. No follow-ups on file.   Kris Hartmann, NP

## 2023-01-27 ENCOUNTER — Emergency Department: Payer: Medicare Other

## 2023-01-27 ENCOUNTER — Emergency Department
Admission: EM | Admit: 2023-01-27 | Discharge: 2023-01-27 | Disposition: A | Discharge status: Home / Self Care | Payer: Medicare Other | Attending: Emergency Medicine | Admitting: Emergency Medicine

## 2023-01-27 DIAGNOSIS — Y838 Other surgical procedures as the cause of abnormal reaction of the patient, or of later complication, without mention of misadventure at the time of the procedure: Secondary | ICD-10-CM | POA: Diagnosis not present

## 2023-01-27 DIAGNOSIS — L7621 Postprocedural hemorrhage and hematoma of skin and subcutaneous tissue following a dermatologic procedure: Secondary | ICD-10-CM | POA: Insufficient documentation

## 2023-01-27 LAB — CBC WITH DIFFERENTIAL/PLATELET
Abs Immature Granulocytes: 0.08 10*3/uL — ABNORMAL HIGH (ref 0.00–0.07)
Basophils Absolute: 0 10*3/uL (ref 0.0–0.1)
Basophils Relative: 0 %
Eosinophils Absolute: 0.1 10*3/uL (ref 0.0–0.5)
Eosinophils Relative: 1 %
HCT: 39.2 % (ref 39.0–52.0)
Hemoglobin: 12.8 g/dL — ABNORMAL LOW (ref 13.0–17.0)
Immature Granulocytes: 1 %
Lymphocytes Relative: 10 %
Lymphs Abs: 0.8 10*3/uL (ref 0.7–4.0)
MCH: 35 pg — ABNORMAL HIGH (ref 26.0–34.0)
MCHC: 32.7 g/dL (ref 30.0–36.0)
MCV: 107.1 fL — ABNORMAL HIGH (ref 80.0–100.0)
Monocytes Absolute: 0.9 10*3/uL (ref 0.1–1.0)
Monocytes Relative: 11 %
Neutro Abs: 6 10*3/uL (ref 1.7–7.7)
Neutrophils Relative %: 77 %
Platelets: 169 10*3/uL (ref 150–400)
RBC: 3.66 MIL/uL — ABNORMAL LOW (ref 4.22–5.81)
RDW: 14.4 % (ref 11.5–15.5)
WBC: 7.8 10*3/uL (ref 4.0–10.5)
nRBC: 0 % (ref 0.0–0.2)

## 2023-01-27 LAB — BASIC METABOLIC PANEL
Anion gap: 6 (ref 5–15)
BUN: 31 mg/dL — ABNORMAL HIGH (ref 8–23)
CO2: 27 mmol/L (ref 22–32)
Calcium: 8.9 mg/dL (ref 8.9–10.3)
Chloride: 103 mmol/L (ref 98–111)
Creatinine, Ser: 1.09 mg/dL (ref 0.61–1.24)
GFR, Estimated: >60 mL/min (ref >60)
Glucose, Bld: 145 mg/dL — ABNORMAL HIGH (ref 70–99)
Potassium: 4.4 mmol/L (ref 3.5–5.1)
Sodium: 136 mmol/L (ref 135–145)

## 2023-01-27 LAB — PROTIME-INR
INR: 1.1 (ref 0.8–1.2)
Prothrombin Time: 14.1 seconds (ref 11.4–15.2)

## 2023-01-27 MED ORDER — OXIDIZED CELLULOSE EX PADS: 1.0000 | MEDICATED_PAD | Freq: Once | CUTANEOUS | Status: DC

## 2023-01-27 MED ORDER — LIDOCAINE HCL (PF) 1 % IJ SOLN
5.0000 mL | Freq: Once | INTRAMUSCULAR | Status: AC
  Administered 2023-01-27: 5 mL
  Filled 2023-01-27: qty 5

## 2023-01-27 NOTE — ED Triage Notes (Addendum)
Pt to ED via POV from home. Pt had two stents placed on Friday, one in each leg. Pt reports bleeding from right leg stent due to atherosclerotic occlusive disease. Pt also does endorse minimal pain in right leg. Pt reports bleeding has been present since surgery but believes it is getting worse. Procedure was done by Dr. Delana Meyer.   Pt denies SOB or CP. Pt is on blood thinners.

## 2023-01-27 NOTE — ED Provider Notes (Signed)
[facility] Provider Note    None    (approximate)   History   Post-op Problem (Right leg stent bleeding and painful )   HPI  Victor Rojas is a 77 y.o. male past medical history significant for PAD with recent percutaneous angioplasty and stent placement to the bilateral common iliac arteries on 01/25/2023 with Dr. Delana Meyer, who presents to the emergency department with bleeding from his right groin site.  States that he has been having bright red blood that has been oozing over the past 24 hours.  Attempted to pack on his own but had ongoing bleeding throughout the night.  Denies any change of numbness or weakness to bilateral lower extremities.  No issues with the left side.  Patient is taking aspirin and was added on Plavix following his procedure.  No further anticoagulation.  Denies prior history of A-fib or atrial tachycardias.     Physical Exam   Triage Vital Signs: ED Triage Vitals [01/27/23 0952]  Enc Vitals Group     BP 120/75     Pulse Rate 70     Resp 18     Temp 98.4 F (36.9 C)     Temp Source Oral     SpO2 98 %     Weight      Height      Head Circumference      Peak Flow      Pain Score      Pain Loc      Pain Edu?      Excl. in [place]?     Most recent vital signs: Vitals:   01/27/23 0952  BP: 120/75  Pulse: 70  Resp: 18  Temp: 98.4 F (36.9 C)  SpO2: 98%    Physical Exam Constitutional:      Appearance: He is well-developed.  HENT:     Head: Atraumatic.  Eyes:     Conjunctiva/sclera: Conjunctivae normal.  Cardiovascular:     Rate and Rhythm: Rhythm irregular.  Pulmonary:     Effort: No respiratory distress.  Musculoskeletal:     Cervical back: Normal range of motion.  Skin:    General: Skin is warm.     Comments: Ecchymosis to the right groin site with 1 cm oozing bright red blood, no pulsatile bleeding.  Unable to palpate a pulse to bilateral lower extremities.  Neurological:     Mental Status: He is alert.  Mental status is at baseline.     IMPRESSION / MDM / ASSESSMENT AND PLAN / ED COURSE  I reviewed the triage vital signs and the nursing notes.  Differential diagnosis including hematoma, pseudoaneurysm, thrombosis  EKG  I, Nathaniel Man, the attending physician, personally viewed and interpreted this ECG.   Rate: Normal  Rhythm: Normal sinus  Axis: Normal  Intervals: Normal  ST&T Change: None  No tachycardic or bradycardic dysrhythmias while on cardiac telemetry.  RADIOLOGY I independently reviewed imaging, my interpretation of imaging: Ultrasound arterial to evaluate for pseudoaneurysm  LABS (all labs ordered are listed, but only abnormal results are displayed) Labs interpreted as -    Labs Reviewed  CBC WITH DIFFERENTIAL/PLATELET - Abnormal; Notable for the following components:      Result Value   RBC 3.66 (*)    Hemoglobin 12.8 (*)    MCV 107.1 (*)    MCH 35.0 (*)    Abs Immature Granulocytes 0.08 (*)    All other components within normal limits  BASIC METABOLIC PANEL -  Abnormal; Notable for the following components:   Glucose, Bld 145 (*)    BUN 31 (*)    All other components within normal limits  PROTIME-INR    TREATMENT    MDM    Patient lab work overall reassuring.  Ultrasound showed no signs of hematoma or pseudoaneurysm.  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  Discussed this patient's case with vascular surgery who stated that this was a chronic finding.  Discussed during this ditch in the area that was bleeding and covering with clotting factor agent.  Stated that that would be fine and to have them follow-up this week in clinic.     PROCEDURES:  Critical Care performed: No  ..Laceration Repair  Date/Time: 01/27/2023 2:14 PM  Performed by: Nathaniel Man, MD Authorized by: Nathaniel Man, MD   Consent:    Consent obtained:  Verbal   Consent given by:  Patient   Risks, benefits, and alternatives were discussed: yes     Risks  discussed:  Pain, nerve damage, poor wound healing, poor cosmetic result, vascular damage, tendon damage, infection and need for additional repair   Alternatives discussed:  Delayed treatment and no treatment Universal protocol:    Procedure explained and questions answered to patient or proxy's satisfaction: yes     Relevant documents present and verified: yes     Test results available: yes     Imaging studies available: yes     Required blood products, implants, devices, and special equipment available: yes     Patient identity confirmed:  Verbally with patient Anesthesia:    Anesthesia method:  Local infiltration   Local anesthetic:  Lidocaine 1% w/o epi Laceration details:    Location:  Leg   Leg location:  R upper leg   Length (cm):  1 Pre-procedure details:    Preparation:  Patient was prepped and draped in usual sterile fashion Treatment:    Amount of cleaning:  Standard Skin repair:    Repair method:  Sutures   Suture size:  4-0   Suture material:  Prolene   Suture technique:  Simple interrupted   Number of sutures:  1 Approximation:    Approximation:  Close Repair type:    Repair type:  Simple Post-procedure details:    Dressing:  Sterile dressing   Procedure completion:  Tolerated well, no immediate complications    Patient's presentation is most consistent with acute presentation with potential threat to life or bodily function.   MEDICATIONS ORDERED IN ED: Medications  lidocaine (PF) (XYLOCAINE) 1 % injection 5 mL (5 mLs Other Given 01/27/23 1310)    FINAL CLINICAL IMPRESSION(S) / ED DIAGNOSES   Final diagnoses:  Postoperative hemorrhage of subcutaneous tissue following dermatologic procedure     Rx / DC Orders   ED Discharge Orders     None        Note:  This document was prepared using Dragon voice recognition software and may include unintentional dictation errors.   Nathaniel Man, MD 01/27/23 1428

## 2023-01-28 ENCOUNTER — Encounter: Payer: Self-pay | Admitting: Vascular Surgery

## 2023-01-29 ENCOUNTER — Encounter: Payer: Self-pay | Admitting: Vascular Surgery

## 2023-01-31 ENCOUNTER — Encounter (INDEPENDENT_AMBULATORY_CARE_PROVIDER_SITE_OTHER): Payer: Self-pay | Admitting: Vascular Surgery

## 2023-01-31 ENCOUNTER — Ambulatory Visit (INDEPENDENT_AMBULATORY_CARE_PROVIDER_SITE_OTHER): Payer: Medicare Other | Admitting: Vascular Surgery

## 2023-01-31 VITALS — BP 98/62 | HR 85 | Resp 18 | Ht 68.0 in | Wt 194.8 lb

## 2023-01-31 DIAGNOSIS — E782 Mixed hyperlipidemia: Secondary | ICD-10-CM

## 2023-01-31 DIAGNOSIS — I10 Essential (primary) hypertension: Secondary | ICD-10-CM | POA: Diagnosis not present

## 2023-01-31 DIAGNOSIS — I70213 Atherosclerosis of native arteries of extremities with intermittent claudication, bilateral legs: Secondary | ICD-10-CM

## 2023-01-31 DIAGNOSIS — I6523 Occlusion and stenosis of bilateral carotid arteries: Secondary | ICD-10-CM | POA: Diagnosis not present

## 2023-01-31 DIAGNOSIS — E1365 Other specified diabetes mellitus with hyperglycemia: Secondary | ICD-10-CM | POA: Diagnosis not present

## 2023-01-31 NOTE — Progress Notes (Signed)
MRN : [id number]  Victor Rojas is a 77 y.o. (1946-12-05) male who presents with chief complaint of check circulation.  History of Present Illness: The patient returns to the office for followup and review status post angiogram with intervention on 01/25/2023.   Procedure:  Percutaneous transluminal angioplasty and stent placement right common iliac artery; "kissing balloon" technique Percutaneous transluminal and plasty and stent placement left common iliac artery; "kissing balloon" technique  The patient notes no improvement in the lower extremity symptoms. He continues to have extremely short distance claudication and some rest pain symptoms. No new ulcers or wounds have occurred since the last visit.  There have been no significant changes to the patient's overall health care.  No documented history of amaurosis fugax or recent TIA symptoms. There are no recent neurological changes noted. No documented history of DVT, PE or superficial thrombophlebitis. The patient denies recent episodes of angina or shortness of breath.    No outpatient medications have been marked as taking for the 01/31/23 encounter (Appointment) with Delana Meyer, Dolores Lory, MD.    Past Medical History:  Diagnosis Date   Adenoma of colon    Arthritis    Diabetes mellitus without complication ([place])    Diverticulosis    GERD (gastroesophageal reflux disease)    Hiatal hernia    Hiatal hernia    History of gout    History of kidney stones    Hyperlipemia    Hypertension    Peripheral vascular disease ([place])    Pre-diabetes     Past Surgical History:  Procedure Laterality Date   BILIARY DILATION  01/17/2023   Procedure: BILIARY DILATION;  Surgeon: [name], MD;  Location: [place];  Service: Gastroenterology;;   COLONOSCOPY     COLONOSCOPY WITH PROPOFOL N/A 06/18/2016   Procedure: COLONOSCOPY WITH PROPOFOL;  Surgeon: Lollie Sails, MD;  Location: [facility] ENDOSCOPY;  Service:  Endoscopy;  Laterality: N/A;   COLONOSCOPY WITH PROPOFOL N/A 01/09/2021   Procedure: COLONOSCOPY WITH PROPOFOL;  Surgeon: Lesly Rubenstein, MD;  Location: [facility] ENDOSCOPY;  Service: Endoscopy;  Laterality: N/A;   ENDARTERECTOMY Right 12/19/2018   Procedure: ENDARTERECTOMY CAROTID;  Surgeon: Katha Cabal, MD;  Location: [facility] ORS;  Service: Vascular;  Laterality: Right;   ENDOSCOPIC RETROGRADE CHOLANGIOPANCREATOGRAPHY (ERCP) WITH PROPOFOL N/A 01/17/2023   Procedure: ENDOSCOPIC RETROGRADE CHOLANGIOPANCREATOGRAPHY (ERCP) WITH PROPOFOL;  Surgeon: [name], MD;  Location: [facility] ENDOSCOPY;  Service: Gastroenterology;  Laterality: N/A;   ESOPHAGOGASTRODUODENOSCOPY (EGD) WITH PROPOFOL N/A 06/18/2016   Procedure: ESOPHAGOGASTRODUODENOSCOPY (EGD) WITH PROPOFOL;  Surgeon: Lollie Sails, MD;  Location: [facility] ENDOSCOPY;  Service: Endoscopy;  Laterality: N/A;   ESOPHAGOGASTRODUODENOSCOPY (EGD) WITH PROPOFOL N/A 01/09/2021   Procedure: ESOPHAGOGASTRODUODENOSCOPY (EGD) WITH PROPOFOL;  Surgeon: Lesly Rubenstein, MD;  Location: [facility] ENDOSCOPY;  Service: Endoscopy;  Laterality: N/A;   HERNIA REPAIR     umbilical   LOWER EXTREMITY ANGIOGRAPHY Right 01/25/2023   Procedure: Lower Extremity Angiography;  Surgeon: Katha Cabal, MD;  Location: [place] CV LAB;  Service: Cardiovascular;  Laterality: Right;   REMOVAL OF STONES  01/17/2023   Procedure: REMOVAL OF STONES;  Surgeon: [name], MD;  Location: [place];  Service: Gastroenterology;;   Joan Mayans  01/17/2023   Procedure: Joan Mayans;  Surgeon: [name], MD;  Location: [facility] ENDOSCOPY;  Service: Gastroenterology;;   TONSILLECTOMY      Social History Social History   Tobacco Use   Smoking  status: Former    Types: Cigarettes    Quit date: 02/03/1989    Years since quitting: 34.0   Smokeless tobacco: Never  Vaping Use   Vaping Use: Never used  Substance Use Topics   Alcohol use: Not Currently    Alcohol/week: 6.0 standard  drinks of alcohol    Types: 6 Cans of beer per week   Drug use: No    Family History Family History  Problem Relation Age of Onset   Cataracts Mother    Heart attack Mother    Cancer Sister     No Known Allergies   REVIEW OF SYSTEMS (Negative unless checked)  Constitutional: '[]'$ Weight loss  '[]'$ Fever  '[]'$ Chills Cardiac: '[]'$ Chest pain   '[]'$ Chest pressure   '[]'$ Palpitations   '[]'$ Shortness of breath when laying flat   '[]'$ Shortness of breath with exertion. Vascular:  '[x]'$ Pain in legs with walking   '[]'$ Pain in legs at rest  '[]'$ History of DVT   '[]'$ Phlebitis   '[]'$ Swelling in legs   '[]'$ Varicose veins   '[]'$ Non-healing ulcers Pulmonary:   '[]'$ Uses home oxygen   '[]'$ Productive cough   '[]'$ Hemoptysis   '[]'$ Wheeze  '[]'$ COPD   '[]'$ Asthma Neurologic:  '[]'$ Dizziness   '[]'$ Seizures   '[]'$ History of stroke   '[]'$ History of TIA  '[]'$ Aphasia   '[]'$ Vissual changes   '[]'$ Weakness or numbness in arm   '[]'$ Weakness or numbness in leg Musculoskeletal:   '[]'$ Joint swelling   '[]'$ Joint pain   '[]'$ Low back pain Hematologic:  '[]'$ Easy bruising  '[]'$ Easy bleeding   '[]'$ Hypercoagulable state   '[]'$ Anemic Gastrointestinal:  '[]'$ Diarrhea   '[]'$ Vomiting  '[]'$ Gastroesophageal reflux/heartburn   '[]'$ Difficulty swallowing. Genitourinary:  '[]'$ Chronic kidney disease   '[]'$ Difficult urination  '[]'$ Frequent urination   '[]'$ Blood in urine Skin:  '[]'$ Rashes   '[]'$ Ulcers  Psychological:  '[]'$ History of anxiety   '[]'$  History of major depression.  Physical Examination  There were no vitals filed for this visit. There is no height or weight on file to calculate BMI. Gen: WD/WN, NAD Head: /AT, No temporalis wasting.  Ear/Nose/Throat: Hearing grossly intact, nares w/o erythema or drainage Eyes: PER, EOMI, sclera nonicteric.  Neck: Supple, no masses.  No bruit or JVD.  Pulmonary:  Good air movement, no audible wheezing, no use of accessory muscles.  Cardiac: RRR, normal S1, S2, no Murmurs. Vascular:  mild trophic changes, no open wounds Vessel Right Left  Radial Palpable Palpable  PT Not Palpable  Not Palpable  DP Not Palpable Not Palpable  Gastrointestinal: soft, non-distended. No guarding/no peritoneal signs.  Musculoskeletal: M/S 5/5 throughout.  No visible deformity.  Neurologic: CN 2-12 intact. Pain and light touch intact in extremities.  Symmetrical.  Speech is fluent. Motor exam as listed above. Psychiatric: Judgment intact, Mood & affect appropriate for pt's clinical situation. Dermatologic: No rashes or ulcers noted.  No changes consistent with cellulitis.   CBC Lab Results  Component Value Date   WBC 7.8 01/27/2023   HGB 12.8 (L) 01/27/2023   HCT 39.2 01/27/2023   MCV 107.1 (H) 01/27/2023   PLT 169 01/27/2023    BMET    Component Value Date/Time   NA 136 01/27/2023 1126   K 4.4 01/27/2023 1126   CL 103 01/27/2023 1126   CO2 27 01/27/2023 1126   GLUCOSE 145 (H) 01/27/2023 1126   BUN 31 (H) 01/27/2023 1126   CREATININE 1.09 01/27/2023 1126   CALCIUM 8.9 01/27/2023 1126   GFRNONAA >60 01/27/2023 1126   GFRAA >60 12/20/2018 0429   Estimated Creatinine Clearance: 64.3 mL/min (by C-G formula  based on SCr of 1.09 mg/dL).  COAG Lab Results  Component Value Date   INR 1.1 01/27/2023   INR 1.6 (H) 01/17/2023   INR 1.7 (H) 01/17/2023    Radiology [place] Lower Ext Art Right Ltd  Result Date: 01/27/2023 CLINICAL DATA:  Recent angioplasty and stent to the right common iliac artery. Bleeding/hematoma. Assess for pseudoaneurysm. EXAM: RIGHT LOWER EXTREMITY/GROIN ARTERIAL DUPLEX SCAN TECHNIQUE: Gray-scale sonography as well as color Doppler and duplex ultrasound was performed to evaluate the vasculature of the right groin. COMPARISON:  None Available. FINDINGS: No hematoma. No evidence of a pseudoaneurysm. Atherosclerotic plaque noted along the common femoral artery. Occluded right femoral artery. Overlying soft tissue edema. IMPRESSION: 1. No hematoma or evidence of a pseudoaneurysm. 2. Occluded right femoral artery. Electronically Signed   By: Lajean Manes M.D.   On:  01/27/2023 12:34   PERIPHERAL VASCULAR CATHETERIZATION  Result Date: 01/25/2023 See surgical note for result.  VAS [place] ABI WITH/WO TBI  Result Date: 01/24/2023  LOWER EXTREMITY DOPPLER STUDY Patient Name:  Victor Rojas  Date of Exam:   01/24/2023 Medical Rec #: [id number]         Accession #:    [id number] Date of Birth: 13-Dec-1945         Patient Gender: M Patient Age:   28 years Exam Location:  [facility] Procedure:      VAS [place] ABI WITH/WO TBI Referring Phys: [facility] --------------------------------------------------------------------------------  Indications: Claudication, and peripheral artery disease. High Risk Factors: Hypertension, Diabetes, past history of smoking.  Performing Technologist: Delorise Shiner RVT  Examination Guidelines: A complete evaluation includes at minimum, Doppler waveform signals and systolic blood pressure reading at the level of bilateral brachial, anterior tibial, and posterior tibial arteries, when vessel segments are accessible. Bilateral testing is considered an integral part of a complete examination. Photoelectric Plethysmograph (PPG) waveforms and toe systolic pressure readings are included as required and additional duplex testing as needed. Limited examinations for reoccurring indications may be performed as noted.  ABI Findings: +---------+------------------+-----+----------+--------+ Right    Rt Pressure (mmHg)IndexWaveform  Comment  +---------+------------------+-----+----------+--------+ Brachial 128                                       +---------+------------------+-----+----------+--------+ PTA      79                0.62 monophasic         +---------+------------------+-----+----------+--------+ DP       76                0.59 monophasic         +---------+------------------+-----+----------+--------+ Great Toe0                 0.00                    +---------+------------------+-----+----------+--------+  +---------+------------------+-----+----------+-------+ Left     Lt Pressure (mmHg)IndexWaveform  Comment +---------+------------------+-----+----------+-------+ Brachial 121                                      +---------+------------------+-----+----------+-------+ PTA      78                0.61 monophasic        +---------+------------------+-----+----------+-------+ DP  78                0.61 monophasic        +---------+------------------+-----+----------+-------+ Great Toe0                 0.00                   +---------+------------------+-----+----------+-------+ +-------+-----------+-----------+------------+------------+ ABI/TBIToday's ABIToday's TBIPrevious ABIPrevious TBI +-------+-----------+-----------+------------+------------+ Right  0.62       0.000      0.59        0.57         +-------+-----------+-----------+------------+------------+ Left   0.61       0.51       0.68        0.86         +-------+-----------+-----------+------------+------------+ Prior ABIs on 03/2021 were 0.66 on the right and 0.68 on the left. Right ABIs appear essentially unchanged compared to prior study on 09/25/2021. Left ABIs appear decreased compared to prior study on 09/25/2021.  Summary: Right: Resting right ankle-brachial index indicates moderate right lower extremity arterial disease. The right toe-brachial index is abnormal. Left: Resting left ankle-brachial index indicates moderate left lower extremity arterial disease. The left toe-brachial index is abnormal. *See table(s) above for measurements and observations.  Electronically signed by Victor Pilar MD on 01/24/2023 at 12:42:59 PM.    Final    DG ERCP  Result Date: 01/17/2023 CLINICAL DATA:  ERCP for jaundice. History of cholecystectomy with choledocholithiasis demonstrated on preceding MRCP. EXAM: ERCP TECHNIQUE: Multiple spot images obtained with the fluoroscopic device and submitted for interpretation  post-procedure. FLUOROSCOPY TIME: FLUOROSCOPY TIME 38 seconds (17.1 mGy) COMPARISON:  MRCP-01/16/2023 FINDINGS: Ten spot intraoperative fluoroscopic images of the right upper abdominal quadrant during ERCP are provided for review Initial image demonstrates an ERCP probe overlying the right upper abdominal quadrant. Subsequent images demonstrate selective cannulation and opacification of the common bile duct which appears mildly dilated. Subsequent images demonstrate insufflation of a balloon within distal aspect of the CBD with subsequent biliary sweeping and presumed sphincterotomy There is minimal opacification of the intrahepatic biliary tree and residual cystic duct. There is no definitive opacification of the pancreatic duct. IMPRESSION: ERCP with biliary sweeping and presumed sphincterotomy as above. These images were submitted for radiologic interpretation only. Please see the procedural report for the amount of contrast and the fluoroscopy time utilized. Electronically Signed   By: Sandi Mariscal M.D.   On: 01/17/2023 15:04   ECHOCARDIOGRAM COMPLETE  Result Date: 01/17/2023    ECHOCARDIOGRAM REPORT   Patient Name:   Victor Rojas Date of Exam: 01/17/2023 Medical Rec #:  [id number]        Height:       68.0 in Accession #:    [id number]       Weight:       216.7 lb Date of Birth:  1946/10/27        BSA:          2.115 m Patient Age:    42 years         BP:           109/52 mmHg Patient Gender: M                HR:           86 bpm. Exam Location:  Inpatient Procedure: 2D Echo, Cardiac Doppler and Color Doppler Indications:    Elevated Troponin  History:  Patient has no prior history of Echocardiogram examinations. PAD                 and TIA, Arrythmias:Tachycardia; Risk Factors:Hypertension,                 Diabetes and Dyslipidemia.  Sonographer:    Ronny Flurry Referring Phys: [phone] Alpha  1. Left ventricular ejection fraction, by estimation, is 60 to 65%. The left ventricle  has normal function. The left ventricle has no regional wall motion abnormalities. Left ventricular diastolic parameters are indeterminate.  2. Right ventricular systolic function is normal. The right ventricular size is normal.  3. The mitral valve is normal in structure. No evidence of mitral valve regurgitation. No evidence of mitral stenosis.  4. The aortic valve is tricuspid. Aortic valve regurgitation is not visualized. No aortic stenosis is present.  5. Aortic dilatation noted. There is mild dilatation of the ascending aorta, measuring 40 mm.  6. The inferior vena cava is dilated in size with <50% respiratory variability, suggesting right atrial pressure of 15 mmHg. Comparison(s): No prior Echocardiogram. FINDINGS  Left Ventricle: Left ventricular ejection fraction, by estimation, is 60 to 65%. The left ventricle has normal function. The left ventricle has no regional wall motion abnormalities. The left ventricular internal cavity size was normal in size. There is  no left ventricular hypertrophy. Left ventricular diastolic parameters are indeterminate. Right Ventricle: The right ventricular size is normal. Right ventricular systolic function is normal. Left Atrium: Left atrial size was normal in size. Right Atrium: Right atrial size was normal in size. Pericardium: Trivial pericardial effusion is present. Mitral Valve: The mitral valve is normal in structure. Mild mitral annular calcification. No evidence of mitral valve regurgitation. No evidence of mitral valve stenosis. Tricuspid Valve: The tricuspid valve is normal in structure. Tricuspid valve regurgitation is mild . No evidence of tricuspid stenosis. Aortic Valve: The aortic valve is tricuspid. Aortic valve regurgitation is not visualized. No aortic stenosis is present. Aortic valve mean gradient measures 3.0 mmHg. Aortic valve peak gradient measures 6.7 mmHg. Aortic valve area, by VTI measures 3.61 cm. Pulmonic Valve: The pulmonic valve was normal in  structure. Pulmonic valve regurgitation is mild. No evidence of pulmonic stenosis. Aorta: Aortic dilatation noted. There is mild dilatation of the ascending aorta, measuring 40 mm. Venous: The inferior vena cava is dilated in size with less than 50% respiratory variability, suggesting right atrial pressure of 15 mmHg. IAS/Shunts: No atrial level shunt detected by color flow Doppler.  LEFT VENTRICLE PLAX 2D LVIDd:         4.90 cm   Diastology LVIDs:         3.60 cm   LV e' medial:    6.42 cm/s LV PW:         1.00 cm   LV E/e' medial:  14.5 LV IVS:        1.10 cm   LV e' lateral:   8.05 cm/s LVOT diam:     2.30 cm   LV E/e' lateral: 11.6 LV SV:         90 LV SV Index:   43 LVOT Area:     4.15 cm                           3D Volume EF:  3D EF:        56 %                          LV EDV:       222 ml                          LV ESV:       98 ml                          LV SV:        124 ml RIGHT VENTRICLE             IVC RV S prime:     16.20 cm/s  IVC diam: 2.70 cm TAPSE (M-mode): 1.8 cm LEFT ATRIUM             Index        RIGHT ATRIUM           Index LA diam:        4.60 cm 2.18 cm/m   RA Area:     19.20 cm LA Vol (A2C):   49.0 ml 23.17 ml/m  RA Volume:   53.70 ml  25.39 ml/m LA Vol (A4C):   56.7 ml 26.81 ml/m LA Biplane Vol: 54.8 ml 25.91 ml/m  AORTIC VALVE AV Area (Vmax):    3.24 cm AV Area (Vmean):   3.45 cm AV Area (VTI):     3.61 cm AV Vmax:           129.00 cm/s AV Vmean:          85.700 cm/s AV VTI:            0.250 m AV Peak Grad:      6.7 mmHg AV Mean Grad:      3.0 mmHg LVOT Vmax:         100.67 cm/s LVOT Vmean:        71.233 cm/s LVOT VTI:          0.217 m LVOT/AV VTI ratio: 0.87  AORTA Ao Root diam: 3.30 cm Ao Asc diam:  4.00 cm MITRAL VALVE               TRICUSPID VALVE MV Area (PHT): 4.31 cm    TR Peak grad:   30.2 mmHg MV Decel Time: 176 msec    TR Vmax:        275.00 cm/s MV E velocity: 93.00 cm/s MV A velocity: 86.10 cm/s  SHUNTS MV E/A ratio:  1.08        Systemic  VTI:  0.22 m                            Systemic Diam: 2.30 cm Kirk Ruths MD Electronically signed by Kirk Ruths MD Signature Date/Time: 01/17/2023/12:17:49 PM    Final    DG Chest Port 1 View  Result Date: 01/17/2023 CLINICAL DATA:  Respiratory failure EXAM: PORTABLE CHEST 1 VIEW COMPARISON:  None Available. FINDINGS: Normal mediastinum and cardiac silhouette. Normal pulmonary vasculature. No evidence of effusion, infiltrate, or pneumothorax. No acute bony abnormality. IMPRESSION: No acute cardiopulmonary process. Electronically Signed   By: Suzy Bouchard M.D.   On: 01/17/2023 09:38   MR ABDOMEN MRCP W WO CONTAST  Result Date: 01/16/2023 CLINICAL DATA:  Jaundice EXAM: MRI ABDOMEN WITHOUT  AND WITH CONTRAST (INCLUDING MRCP) TECHNIQUE: Multiplanar multisequence MR imaging of the abdomen was performed both before and after the administration of intravenous contrast. Heavily T2-weighted images of the biliary and pancreatic ducts were obtained, and three-dimensional MRCP images were rendered by post processing. CONTRAST:  69m GADAVIST GADOBUTROL 1 MMOL/ML IV SOLN COMPARISON:  Multiple priors including CT and ultrasound dated January 15, 2022. FINDINGS: Despite efforts by the technologist and patient, motion artifact is present on today's exam and could not be eliminated. This reduces exam sensitivity and specificity. Lower chest: Heterogeneous signal in the bilateral lung bases commonly reflects atelectasis. Hepatobiliary: Periportal edema with peribiliary enhancement and increased biliary ductal dilation, the common duct measuring 11 mm in diameter. There are a few T2 hypointense foci in the distal duct on images 30/3 and 32/4. Heterogeneous enhancement of the hepatic parenchyma with corresponding reduced diffusivity in these areas. Diffuse hepatic steatosis.  Slight nodular hepatic contours. Pancreas:  Increased T2 signal about the pancreatic head. Spleen:  No splenomegaly. Adrenals/Urinary Tract:  Bilateral adrenal glands appear normal. Similar left renal atrophy. No hydronephrosis. Stomach/Bowel: Mild wall thickening of the ascending colon. Vascular/Lymphatic: Prominent right upper quadrant lymph nodes for instance measuring 14 mm on image 45/1204. The portal, splenic and superior mesenteric veins are patent. Smooth IVC contours. Aortic atherosclerosis. Other: No walled off fluid collections. Trace perihepatic free fluid. Musculoskeletal: No suspicious bone lesions identified. IMPRESSION: Examination is significantly degraded by respiratory motion limiting sensitivity and specificity. Within this context: 1. Choledocholithiasis with increased biliary ductal dilation, periportal edema and peribiliary enhancement. Additionally there is heterogeneous enhancement of the hepatic parenchyma with associated areas of reduced diffusivity in the hepatic parenchyma. Findings which are highly concerning for choledocholithiasis with ascending cholangitis. 2. Increased T2 signal about the pancreatic head which may reflect mild pancreatitis. 3. Mild wall thickening of the ascending colon may be reactive or reflect colitis. 4. Diffuse hepatic steatosis with slight nodular hepatic contours which may reflect early cirrhosis. 5. Prominent right upper quadrant lymph nodes are nonspecific and may be reactive. 6. Heterogeneous signal in the bilateral lung bases commonly reflects atelectasis. These results will be called to the ordering clinician or representative by the Radiologist Assistant, and communication documented in the PACS or [employer] Electronically Signed   By: JDahlia BailiffM.D.   On: 01/16/2023 16:41   CT ABDOMEN PELVIS W CONTRAST  Addendum Date: 01/15/2023   ADDENDUM REPORT: 01/15/2023 15:21 ADDENDUM: There is mild increased density within the lumen of the distal common bile duct that appears new compared to the 03/01/2022 CT prior to the interval cholecystectomy (current CT axial series 3 images 33  through 38) which may represent a cluster of common bile duct stones measuring up to approximately 4 mm in transverse dimension and 11 mm in craniocaudal length (coronal series 5, image 45). Critical Value/emergent results were called by telephone at the time of interpretation on 01/15/2023 at 3:15 pm to [facility], who verbally acknowledged these results. Electronically Signed   By: RYvonne KendallM.D.   On: 01/15/2023 15:21   Result Date: 01/15/2023 CLINICAL DATA:  Epigastric pain. Started last night. Pain thyroid and center of abdomen and radiates upwards. Some nausea. EXAM: CT ABDOMEN AND PELVIS WITH CONTRAST TECHNIQUE: Multidetector CT imaging of the abdomen and pelvis was performed using the standard protocol following bolus administration of intravenous contrast. RADIATION DOSE REDUCTION: This exam was performed according to the departmental dose-optimization program which includes automated exposure control, adjustment of the mA and/or kV  according to patient size and/or use of iterative reconstruction technique. CONTRAST:  159m OMNIPAQUE IOHEXOL 300 MG/ML  SOLN COMPARISON:  Right upper quadrant abdominal ultrasound 03/01/2022; CT abdomen and pelvis 03/01/2022 FINDINGS: Lower chest: There is mild curvilinear and ground-glass likely subsegmental atelectasis and/or scarring within the bilateral lung bases. Minimal left foraminal right posterior pleural thickening appears unchanged from prior there is no definite pleural effusion. Dense coronary artery calcifications are again noted. No pericardial effusion. Cardiac silhouette is again at the upper limits of normal size to mildly enlarged. Possible minimal circumferential esophageal thickening is unchanged to mildly improved from prior and may be secondary to underdistention versus the sequela of prior esophagitis. Hepatobiliary: Smooth liver contours. Interval cholecystectomy. Minimal central intrahepatic biliary ductal dilatation and mild  prominence of the common bile duct, likely normal postsurgical changes following cholecystectomy. No focal liver lesion is seen. Pancreas: Unremarkable. No pancreatic ductal dilatation or surrounding inflammatory changes. Spleen: Unchanged two individual calcifications within the spleen, possibly the sequela of remote granulomatous infection. Adrenals/Urinary Tract: Normal adrenals. There is again mild high-grade atrophy of the left kidney, chronic. There again may be minimally delayed left renal cortical enhancement compared to the right. Vascular calcifications are again seen within the bilateral renal hila. No hydronephrosis. No renal or ureteral stones are seen. The bilateral ureters are normal in caliber. No focal urinary bladder wall thickening. Stomach/Bowel: The terminal ileum is unremarkable. Normal appendix. No dilated loops of bowel are seen to indicate bowel obstruction. No bowel wall thickening. Vascular/Lymphatic: No abdominal aortic aneurysm. High-grade atherosclerotic calcifications within the aorta, bilateral iliac arteries, and origins of the major intra-abdominal aortic branch vessels, similar to prior. No mesenteric, retroperitoneal, or pelvic lymphadenopathy. Reproductive: The prostate is again mildly enlarged. The seminal vesicles are grossly unremarkable. Other: A fat containing left paraumbilical hernia is again seen with orifice measuring up to 11 mm and ventral herniated fat measuring up to approximately 4.7 cm in craniocaudal dimension and 5.1 cm in transverse dimension. This is similar in size to prior but there is new mild inflammatory stranding within the herniated fat (axial series 3 images 54 through 62). Small fat containing right inguinal hernia is unchanged. No free air or free fluid is seen within the abdomen or pelvis. Musculoskeletal: Moderate multilevel degenerative disc changes of the thoracic spine. Mild levocurvature centered at L3. Mild chronic anterior T11 vertebral body  height loss. Large anterior T9-10 and T10-11 bridging osteophytes. IMPRESSION: Compared to 03/01/2022: 1. Interval cholecystectomy. 2. Unchanged size of fat containing left paraumbilical hernia. There is new mild inflammatory stranding within the herniated fat. This may represent a source of pain. Recommend clinical correlation. 3. Unchanged chronic left renal atrophy. 4. Unchanged severe atherosclerotic calcifications within the aorta, bilateral iliac arteries, and origins of the major intra-abdominal aortic branch vessels. Electronically Signed: By: RYvonne KendallM.D. On: 01/15/2023 12:57   [place]LIVER/GB[place]  Result Date: 01/15/2023 CLINICAL DATA:  Elevated liver function tests EXAM: ULTRASOUND ABDOMEN LIMITED RIGHT UPPER QUADRANT COMPARISON:  CT of earlier today. FINDINGS: Gallbladder: Surgically absent Common bile duct: Diameter: Within normal limits after cholecystectomy, 8 mm. Liver: Mildly heterogeneously increased hepatic echogenicity. Portal vein is patent on color Doppler imaging with normal direction of blood flow towards the liver. Other: None. IMPRESSION: Suspect mild hepatic steatosis. No other explanation for elevated liver function tests. Electronically Signed   By: KAbigail MiyamotoM.D.   On: 01/15/2023 15:05     Assessment/Plan 1. Atherosclerosis of native artery of both lower extremities  with intermittent claudication (HCC)  Recommend:  The patient has evidence of severe atherosclerotic changes of both lower extremities associated with rest pain of the right and left feet.  This represents a limb threatening ischemia and places the patient at a high risk for limb loss.  Angiography has been performed and the situation is not ideal for intervention.  Given this finding open surgical repair is recommended.   Patient should undergo arterial reconstruction, bilateral femoral endarterectomies with possible SFA stents versus femoral above-knee popliteal bypasses are indicated  for the bilateral lower extremity with the hope for limb salvage.  The risks and benefits as well as the alternative therapies was discussed in detail with the patient.  All questions were answered.  Patient agrees to proceed with open vascular surgical reconstruction.  The patient will follow up with me in the office after the procedure.   - Ambulatory referral to Cardiology  2. Bilateral carotid artery stenosis Recommend:   Given the patient's asymptomatic subcritical stenosis no further invasive testing or surgery at this time.   Duplex ultrasound shows 1-39% stenosis bilaterally.   Continue antiplatelet therapy as prescribed Continue management of CAD, HTN and Hyperlipidemia Healthy heart diet,  encouraged exercise at least 4 times per week Follow up in 24 months with duplex ultrasound and physical exam  3. Essential hypertension Continue antihypertensive medications as already ordered, these medications have been reviewed and there are no changes at this time.  4. 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) Continue hypoglycemic medications as already ordered, these medications have been reviewed and there are no changes at this time.  Hgb A1C to be monitored as already arranged by primary service - Ambulatory referral to Cardiology  5. Hyperlipidemia, mixed Continue statin as ordered and reviewed, no changes at this time    Victor Pilar, MD  01/31/2023 9:43 AM

## 2023-02-02 ENCOUNTER — Encounter (INDEPENDENT_AMBULATORY_CARE_PROVIDER_SITE_OTHER): Payer: Self-pay | Admitting: Vascular Surgery

## 2023-02-12 NOTE — Progress Notes (Deleted)
MRN : [id number]  MANARD GAILES is a 77 y.o. ([birth year]) male who presents with chief complaint of check circulation.  History of Present Illness:   The patient returns to the office for followup and review status post angiogram with intervention on 01/25/2023.    Procedure:  Percutaneous transluminal angioplasty and stent placement right common iliac artery; "kissing balloon" technique Percutaneous transluminal and plasty and stent placement left common iliac artery; "kissing balloon" technique   At his initial post procedure visit he noted no improvement in the lower extremity symptoms. He continues to have extremely short distance claudication and some rest pain symptoms. No new ulcers or wounds have occurred since the last visit.   There have been no significant changes to the patient's overall health care.   No documented history of amaurosis fugax or recent TIA symptoms. There are no recent neurological changes noted. No documented history of DVT, PE or superficial thrombophlebitis. The patient denies recent episodes of angina or shortness of breath.     No outpatient medications have been marked as taking for the 02/14/23 encounter (Appointment) with Delana Meyer, Dolores Lory, MD.    Past Medical History:  Diagnosis Date   Adenoma of colon    Arthritis    Diabetes mellitus without complication ([place])    Diverticulosis    GERD (gastroesophageal reflux disease)    Hiatal hernia    Hiatal hernia    History of gout    History of kidney stones    Hyperlipemia    Hypertension    Peripheral vascular disease ([place])    Pre-diabetes     Past Surgical History:  Procedure Laterality Date   BILIARY DILATION  01/17/2023   Procedure: BILIARY DILATION;  Surgeon: [name], MD;  Location: [place];  Service: Gastroenterology;;   COLONOSCOPY     COLONOSCOPY WITH PROPOFOL N/A 06/18/2016   Procedure: COLONOSCOPY WITH PROPOFOL;  Surgeon: Lollie Sails, MD;   Location: [facility] ENDOSCOPY;  Service: Endoscopy;  Laterality: N/A;   COLONOSCOPY WITH PROPOFOL N/A 01/09/2021   Procedure: COLONOSCOPY WITH PROPOFOL;  Surgeon: Lesly Rubenstein, MD;  Location: [facility] ENDOSCOPY;  Service: Endoscopy;  Laterality: N/A;   ENDARTERECTOMY Right 12/19/2018   Procedure: ENDARTERECTOMY CAROTID;  Surgeon: Katha Cabal, MD;  Location: [facility] ORS;  Service: Vascular;  Laterality: Right;   ENDOSCOPIC RETROGRADE CHOLANGIOPANCREATOGRAPHY (ERCP) WITH PROPOFOL N/A 01/17/2023   Procedure: ENDOSCOPIC RETROGRADE CHOLANGIOPANCREATOGRAPHY (ERCP) WITH PROPOFOL;  Surgeon: [name], MD;  Location: [facility] ENDOSCOPY;  Service: Gastroenterology;  Laterality: N/A;   ESOPHAGOGASTRODUODENOSCOPY (EGD) WITH PROPOFOL N/A 06/18/2016   Procedure: ESOPHAGOGASTRODUODENOSCOPY (EGD) WITH PROPOFOL;  Surgeon: Lollie Sails, MD;  Location: [facility] ENDOSCOPY;  Service: Endoscopy;  Laterality: N/A;   ESOPHAGOGASTRODUODENOSCOPY (EGD) WITH PROPOFOL N/A 01/09/2021   Procedure: ESOPHAGOGASTRODUODENOSCOPY (EGD) WITH PROPOFOL;  Surgeon: Lesly Rubenstein, MD;  Location: [facility] ENDOSCOPY;  Service: Endoscopy;  Laterality: N/A;   HERNIA REPAIR     umbilical   LOWER EXTREMITY ANGIOGRAPHY Right 01/25/2023   Procedure: Lower Extremity Angiography;  Surgeon: Katha Cabal, MD;  Location: [place] CV LAB;  Service: Cardiovascular;  Laterality: Right;   REMOVAL OF STONES  01/17/2023   Procedure: REMOVAL OF STONES;  Surgeon: [name], MD;  Location: [place];  Service: Gastroenterology;;   Joan Mayans  01/17/2023   Procedure: Joan Mayans;  Surgeon: [name], MD;  Location: [facility] ENDOSCOPY;  Service: Gastroenterology;;   TONSILLECTOMY  Social History Social History   Tobacco Use   Smoking status: Former    Types: Cigarettes    Quit date: 02/03/1989    Years since quitting: 34.0   Smokeless tobacco: Never  Vaping Use   Vaping Use: Never used  Substance Use Topics   Alcohol use: Not  Currently    Alcohol/week: 6.0 standard drinks of alcohol    Types: 6 Cans of beer per week   Drug use: No    Family History Family History  Problem Relation Age of Onset   Cataracts Mother    Heart attack Mother    Cancer Sister     No Known Allergies   REVIEW OF SYSTEMS (Negative unless checked)  Constitutional: '[]'$ Weight loss  '[]'$ Fever  '[]'$ Chills Cardiac: '[]'$ Chest pain   '[]'$ Chest pressure   '[]'$ Palpitations   '[]'$ Shortness of breath when laying flat   '[]'$ Shortness of breath with exertion. Vascular:  '[x]'$ Pain in legs with walking   '[]'$ Pain in legs at rest  '[]'$ History of DVT   '[]'$ Phlebitis   '[]'$ Swelling in legs   '[]'$ Varicose veins   '[]'$ Non-healing ulcers Pulmonary:   '[]'$ Uses home oxygen   '[]'$ Productive cough   '[]'$ Hemoptysis   '[]'$ Wheeze  '[]'$ COPD   '[]'$ Asthma Neurologic:  '[]'$ Dizziness   '[]'$ Seizures   '[]'$ History of stroke   '[]'$ History of TIA  '[]'$ Aphasia   '[]'$ Vissual changes   '[]'$ Weakness or numbness in arm   '[]'$ Weakness or numbness in leg Musculoskeletal:   '[]'$ Joint swelling   '[]'$ Joint pain   '[]'$ Low back pain Hematologic:  '[]'$ Easy bruising  '[]'$ Easy bleeding   '[]'$ Hypercoagulable state   '[]'$ Anemic Gastrointestinal:  '[]'$ Diarrhea   '[]'$ Vomiting  '[]'$ Gastroesophageal reflux/heartburn   '[]'$ Difficulty swallowing. Genitourinary:  '[]'$ Chronic kidney disease   '[]'$ Difficult urination  '[]'$ Frequent urination   '[]'$ Blood in urine Skin:  '[]'$ Rashes   '[]'$ Ulcers  Psychological:  '[]'$ History of anxiety   '[]'$  History of major depression.  Physical Examination  There were no vitals filed for this visit. There is no height or weight on file to calculate BMI. Gen: WD/WN, NAD Head: [place]/AT, No temporalis wasting.  Ear/Nose/Throat: Hearing grossly intact, nares w/o erythema or drainage Eyes: PER, EOMI, sclera nonicteric.  Neck: Supple, no masses.  No bruit or JVD.  Pulmonary:  Good air movement, no audible wheezing, no use of accessory muscles.  Cardiac: RRR, normal S1, S2, no Murmurs. Vascular:  mild trophic changes, no open wounds Vessel Right Left   Radial Palpable Palpable  PT Not Palpable Not Palpable  DP Not Palpable Not Palpable  Gastrointestinal: soft, non-distended. No guarding/no peritoneal signs.  Musculoskeletal: M/S 5/5 throughout.  No visible deformity.  Neurologic: CN 2-12 intact. Pain and light touch intact in extremities.  Symmetrical.  Speech is fluent. Motor exam as listed above. Psychiatric: Judgment intact, Mood & affect appropriate for pt's clinical situation. Dermatologic: No rashes or ulcers noted.  No changes consistent with cellulitis.   CBC Lab Results  Component Value Date   WBC 7.8 01/27/2023   HGB 12.8 (L) 01/27/2023   HCT 39.2 01/27/2023   MCV 107.1 (H) 01/27/2023   PLT 169 01/27/2023    BMET    Component Value Date/Time   NA 136 01/27/2023 1126   K 4.4 01/27/2023 1126   CL 103 01/27/2023 1126   CO2 27 01/27/2023 1126   GLUCOSE 145 (H) 01/27/2023 1126   BUN 31 (H) 01/27/2023 1126   CREATININE 1.09 01/27/2023 1126   CALCIUM 8.9 01/27/2023 1126   GFRNONAA >60 01/27/2023 1126   GFRAA >60 12/20/2018  0429   Estimated Creatinine Clearance: 62.3 mL/min (by C-G formula based on SCr of 1.09 mg/dL).  COAG Lab Results  Component Value Date   INR 1.1 01/27/2023   INR 1.6 (H) 01/17/2023   INR 1.7 (H) 01/17/2023    Radiology [place] Lower Ext Art Right Ltd  Result Date: 01/27/2023 CLINICAL DATA:  Recent angioplasty and stent to the right common iliac artery. Bleeding/hematoma. Assess for pseudoaneurysm. EXAM: RIGHT LOWER EXTREMITY/GROIN ARTERIAL DUPLEX SCAN TECHNIQUE: Gray-scale sonography as well as color Doppler and duplex ultrasound was performed to evaluate the vasculature of the right groin. COMPARISON:  None Available. FINDINGS: No hematoma. No evidence of a pseudoaneurysm. Atherosclerotic plaque noted along the common femoral artery. Occluded right femoral artery. Overlying soft tissue edema. IMPRESSION: 1. No hematoma or evidence of a pseudoaneurysm. 2. Occluded right femoral artery.  Electronically Signed   By: Lajean Manes M.D.   On: 01/27/2023 12:34   PERIPHERAL VASCULAR CATHETERIZATION  Result Date: 01/25/2023 See surgical note for result.  VAS [place] ABI WITH/WO TBI  Result Date: 01/24/2023  LOWER EXTREMITY DOPPLER STUDY Patient Name:  JAKHAI HEPLER  Date of Exam:   01/24/2023 Medical Rec #: [id number]         Accession #:    [id number] Date of Birth: 29-Sep-1946         Patient Gender: M Patient Age:   36 years Exam Location:  [facility] Procedure:      VAS [place] ABI WITH/WO TBI Referring Phys: [facility] --------------------------------------------------------------------------------  Indications: Claudication, and peripheral artery disease. High Risk Factors: Hypertension, Diabetes, past history of smoking.  Performing Technologist: Delorise Shiner RVT  Examination Guidelines: A complete evaluation includes at minimum, Doppler waveform signals and systolic blood pressure reading at the level of bilateral brachial, anterior tibial, and posterior tibial arteries, when vessel segments are accessible. Bilateral testing is considered an integral part of a complete examination. Photoelectric Plethysmograph (PPG) waveforms and toe systolic pressure readings are included as required and additional duplex testing as needed. Limited examinations for reoccurring indications may be performed as noted.  ABI Findings: +---------+------------------+-----+----------+--------+ Right    Rt Pressure (mmHg)IndexWaveform  Comment  +---------+------------------+-----+----------+--------+ Brachial 128                                       +---------+------------------+-----+----------+--------+ PTA      79                0.62 monophasic         +---------+------------------+-----+----------+--------+ DP       76                0.59 monophasic         +---------+------------------+-----+----------+--------+ Great Toe0                 0.00                     +---------+------------------+-----+----------+--------+ +---------+------------------+-----+----------+-------+ Left     Lt Pressure (mmHg)IndexWaveform  Comment +---------+------------------+-----+----------+-------+ Brachial 121                                      +---------+------------------+-----+----------+-------+ PTA      78                0.61 monophasic        +---------+------------------+-----+----------+-------+  DP       78                0.61 monophasic        +---------+------------------+-----+----------+-------+ Great Toe0                 0.00                   +---------+------------------+-----+----------+-------+ +-------+-----------+-----------+------------+------------+ ABI/TBIToday's ABIToday's TBIPrevious ABIPrevious TBI +-------+-----------+-----------+------------+------------+ Right  0.62       0.000      0.59        0.57         +-------+-----------+-----------+------------+------------+ Left   0.61       0.51       0.68        0.86         +-------+-----------+-----------+------------+------------+ Prior ABIs on 03/2021 were 0.66 on the right and 0.68 on the left. Right ABIs appear essentially unchanged compared to prior study on 09/25/2021. Left ABIs appear decreased compared to prior study on 09/25/2021.  Summary: Right: Resting right ankle-brachial index indicates moderate right lower extremity arterial disease. The right toe-brachial index is abnormal. Left: Resting left ankle-brachial index indicates moderate left lower extremity arterial disease. The left toe-brachial index is abnormal. *See table(s) above for measurements and observations.  Electronically signed by [name] MD on 01/24/2023 at 12:42:59 PM.    Final    DG ERCP  Result Date: 01/17/2023 CLINICAL DATA:  ERCP for jaundice. History of cholecystectomy with choledocholithiasis demonstrated on preceding MRCP. EXAM: ERCP TECHNIQUE: Multiple spot images obtained with  the fluoroscopic device and submitted for interpretation post-procedure. FLUOROSCOPY TIME: FLUOROSCOPY TIME 38 seconds (17.1 mGy) COMPARISON:  MRCP-01/16/2023 FINDINGS: Ten spot intraoperative fluoroscopic images of the right upper abdominal quadrant during ERCP are provided for review Initial image demonstrates an ERCP probe overlying the right upper abdominal quadrant. Subsequent images demonstrate selective cannulation and opacification of the common bile duct which appears mildly dilated. Subsequent images demonstrate insufflation of a balloon within distal aspect of the CBD with subsequent biliary sweeping and presumed sphincterotomy There is minimal opacification of the intrahepatic biliary tree and residual cystic duct. There is no definitive opacification of the pancreatic duct. IMPRESSION: ERCP with biliary sweeping and presumed sphincterotomy as above. These images were submitted for radiologic interpretation only. Please see the procedural report for the amount of contrast and the fluoroscopy time utilized. Electronically Signed   By: Sandi Mariscal M.D.   On: 01/17/2023 15:04   ECHOCARDIOGRAM COMPLETE  Result Date: 01/17/2023    ECHOCARDIOGRAM REPORT   Patient Name:   FLORENCIO PEELE Date of Exam: 01/17/2023 Medical Rec #:  [id number]        Height:       68.0 in Accession #:    [id number]       Weight:       216.7 lb Date of Birth:  [birth year]        BSA:          2.115 m Patient Age:    47 years         BP:           109/52 mmHg Patient Gender: M                HR:           86 bpm. Exam Location:  Inpatient Procedure: 2D Echo, Cardiac Doppler and Color Doppler Indications:    Elevated Troponin  History:  Patient has no prior history of Echocardiogram examinations. PAD                 and TIA, Arrythmias:Tachycardia; Risk Factors:Hypertension,                 Diabetes and Dyslipidemia.  Sonographer:    Ronny Flurry Referring Phys: [phone] [place]  1. Left ventricular ejection  fraction, by estimation, is 60 to 65%. The left ventricle has normal function. The left ventricle has no regional wall motion abnormalities. Left ventricular diastolic parameters are indeterminate.  2. Right ventricular systolic function is normal. The right ventricular size is normal.  3. The mitral valve is normal in structure. No evidence of mitral valve regurgitation. No evidence of mitral stenosis.  4. The aortic valve is tricuspid. Aortic valve regurgitation is not visualized. No aortic stenosis is present.  5. Aortic dilatation noted. There is mild dilatation of the ascending aorta, measuring 40 mm.  6. The inferior vena cava is dilated in size with <50% respiratory variability, suggesting right atrial pressure of 15 mmHg. Comparison(s): No prior Echocardiogram. FINDINGS  Left Ventricle: Left ventricular ejection fraction, by estimation, is 60 to 65%. The left ventricle has normal function. The left ventricle has no regional wall motion abnormalities. The left ventricular internal cavity size was normal in size. There is  no left ventricular hypertrophy. Left ventricular diastolic parameters are indeterminate. Right Ventricle: The right ventricular size is normal. Right ventricular systolic function is normal. Left Atrium: Left atrial size was normal in size. Right Atrium: Right atrial size was normal in size. Pericardium: Trivial pericardial effusion is present. Mitral Valve: The mitral valve is normal in structure. Mild mitral annular calcification. No evidence of mitral valve regurgitation. No evidence of mitral valve stenosis. Tricuspid Valve: The tricuspid valve is normal in structure. Tricuspid valve regurgitation is mild . No evidence of tricuspid stenosis. Aortic Valve: The aortic valve is tricuspid. Aortic valve regurgitation is not visualized. No aortic stenosis is present. Aortic valve mean gradient measures 3.0 mmHg. Aortic valve peak gradient measures 6.7 mmHg. Aortic valve area, by VTI measures  3.61 cm. Pulmonic Valve: The pulmonic valve was normal in structure. Pulmonic valve regurgitation is mild. No evidence of pulmonic stenosis. Aorta: Aortic dilatation noted. There is mild dilatation of the ascending aorta, measuring 40 mm. Venous: The inferior vena cava is dilated in size with less than 50% respiratory variability, suggesting right atrial pressure of 15 mmHg. IAS/Shunts: No atrial level shunt detected by color flow Doppler.  LEFT VENTRICLE PLAX 2D LVIDd:         4.90 cm   Diastology LVIDs:         3.60 cm   LV e' medial:    6.42 cm/s LV PW:         1.00 cm   LV E/e' medial:  14.5 LV IVS:        1.10 cm   LV e' lateral:   8.05 cm/s LVOT diam:     2.30 cm   LV E/e' lateral: 11.6 LV SV:         90 LV SV Index:   43 LVOT Area:     4.15 cm                           3D Volume EF:  3D EF:        56 %                          LV EDV:       222 ml                          LV ESV:       98 ml                          LV SV:        124 ml RIGHT VENTRICLE             IVC RV S prime:     16.20 cm/s  IVC diam: 2.70 cm TAPSE (M-mode): 1.8 cm LEFT ATRIUM             Index        RIGHT ATRIUM           Index LA diam:        4.60 cm 2.18 cm/m   RA Area:     19.20 cm LA Vol (A2C):   49.0 ml 23.17 ml/m  RA Volume:   53.70 ml  25.39 ml/m LA Vol (A4C):   56.7 ml 26.81 ml/m LA Biplane Vol: 54.8 ml 25.91 ml/m  AORTIC VALVE AV Area (Vmax):    3.24 cm AV Area (Vmean):   3.45 cm AV Area (VTI):     3.61 cm AV Vmax:           129.00 cm/s AV Vmean:          85.700 cm/s AV VTI:            0.250 m AV Peak Grad:      6.7 mmHg AV Mean Grad:      3.0 mmHg LVOT Vmax:         100.67 cm/s LVOT Vmean:        71.233 cm/s LVOT VTI:          0.217 m LVOT/AV VTI ratio: 0.87  AORTA Ao Root diam: 3.30 cm Ao Asc diam:  4.00 cm MITRAL VALVE               TRICUSPID VALVE MV Area (PHT): 4.31 cm    TR Peak grad:   30.2 mmHg MV Decel Time: 176 msec    TR Vmax:        275.00 cm/s MV E velocity: 93.00 cm/s MV A  velocity: 86.10 cm/s  SHUNTS MV E/A ratio:  1.08        Systemic VTI:  0.22 m                            Systemic Diam: 2.30 cm Kirk Ruths MD Electronically signed by Kirk Ruths MD Signature Date/Time: 01/17/2023/12:17:49 PM    Final    DG Chest Port 1 View  Result Date: 01/17/2023 CLINICAL DATA:  Respiratory failure EXAM: PORTABLE CHEST 1 VIEW COMPARISON:  None Available. FINDINGS: Normal mediastinum and cardiac silhouette. Normal pulmonary vasculature. No evidence of effusion, infiltrate, or pneumothorax. No acute bony abnormality. IMPRESSION: No acute cardiopulmonary process. Electronically Signed   By: Suzy Bouchard M.D.   On: 01/17/2023 09:38   MR ABDOMEN MRCP W WO CONTAST  Result Date: 01/16/2023 CLINICAL DATA:  Jaundice EXAM: MRI ABDOMEN WITHOUT  AND WITH CONTRAST (INCLUDING MRCP) TECHNIQUE: Multiplanar multisequence MR imaging of the abdomen was performed both before and after the administration of intravenous contrast. Heavily T2-weighted images of the biliary and pancreatic ducts were obtained, and three-dimensional MRCP images were rendered by post processing. CONTRAST:  64m GADAVIST GADOBUTROL 1 MMOL/ML IV SOLN COMPARISON:  Multiple priors including CT and ultrasound dated January 15, 2022. FINDINGS: Despite efforts by the technologist and patient, motion artifact is present on today's exam and could not be eliminated. This reduces exam sensitivity and specificity. Lower chest: Heterogeneous signal in the bilateral lung bases commonly reflects atelectasis. Hepatobiliary: Periportal edema with peribiliary enhancement and increased biliary ductal dilation, the common duct measuring 11 mm in diameter. There are a few T2 hypointense foci in the distal duct on images 30/3 and 32/4. Heterogeneous enhancement of the hepatic parenchyma with corresponding reduced diffusivity in these areas. Diffuse hepatic steatosis.  Slight nodular hepatic contours. Pancreas:  Increased T2 signal about the  pancreatic head. Spleen:  No splenomegaly. Adrenals/Urinary Tract: Bilateral adrenal glands appear normal. Similar left renal atrophy. No hydronephrosis. Stomach/Bowel: Mild wall thickening of the ascending colon. Vascular/Lymphatic: Prominent right upper quadrant lymph nodes for instance measuring 14 mm on image 45/1204. The portal, splenic and superior mesenteric veins are patent. Smooth IVC contours. Aortic atherosclerosis. Other: No walled off fluid collections. Trace perihepatic free fluid. Musculoskeletal: No suspicious bone lesions identified. IMPRESSION: Examination is significantly degraded by respiratory motion limiting sensitivity and specificity. Within this context: 1. Choledocholithiasis with increased biliary ductal dilation, periportal edema and peribiliary enhancement. Additionally there is heterogeneous enhancement of the hepatic parenchyma with associated areas of reduced diffusivity in the hepatic parenchyma. Findings which are highly concerning for choledocholithiasis with ascending cholangitis. 2. Increased T2 signal about the pancreatic head which may reflect mild pancreatitis. 3. Mild wall thickening of the ascending colon may be reactive or reflect colitis. 4. Diffuse hepatic steatosis with slight nodular hepatic contours which may reflect early cirrhosis. 5. Prominent right upper quadrant lymph nodes are nonspecific and may be reactive. 6. Heterogeneous signal in the bilateral lung bases commonly reflects atelectasis. These results will be called to the ordering clinician or representative by the Radiologist Assistant, and communication documented in the PACS or [employer] Electronically Signed   By: JDahlia BailiffM.D.   On: 01/16/2023 16:41   CT ABDOMEN PELVIS W CONTRAST  Addendum Date: 01/15/2023   ADDENDUM REPORT: 01/15/2023 15:21 ADDENDUM: There is mild increased density within the lumen of the distal common bile duct that appears new compared to the 03/01/2022 CT prior to  the interval cholecystectomy (current CT axial series 3 images 33 through 38) which may represent a cluster of common bile duct stones measuring up to approximately 4 mm in transverse dimension and 11 mm in craniocaudal length (coronal series 5, image 45). Critical Value/emergent results were called by telephone at the time of interpretation on 01/15/2023 at 3:15 pm to [facility] who verbally acknowledged these results. Electronically Signed   By: RYvonne KendallM.D.   On: 01/15/2023 15:21   Result Date: 01/15/2023 CLINICAL DATA:  Epigastric pain. Started last night. Pain thyroid and center of abdomen and radiates upwards. Some nausea. EXAM: CT ABDOMEN AND PELVIS WITH CONTRAST TECHNIQUE: Multidetector CT imaging of the abdomen and pelvis was performed using the standard protocol following bolus administration of intravenous contrast. RADIATION DOSE REDUCTION: This exam was performed according to the departmental dose-optimization program which includes automated exposure control, adjustment of the mA and/or kV  according to patient size and/or use of iterative reconstruction technique. CONTRAST:  138m OMNIPAQUE IOHEXOL 300 MG/ML  SOLN COMPARISON:  Right upper quadrant abdominal ultrasound 03/01/2022; CT abdomen and pelvis 03/01/2022 FINDINGS: Lower chest: There is mild curvilinear and ground-glass likely subsegmental atelectasis and/or scarring within the bilateral lung bases. Minimal left foraminal right posterior pleural thickening appears unchanged from prior there is no definite pleural effusion. Dense coronary artery calcifications are again noted. No pericardial effusion. Cardiac silhouette is again at the upper limits of normal size to mildly enlarged. Possible minimal circumferential esophageal thickening is unchanged to mildly improved from prior and may be secondary to underdistention versus the sequela of prior esophagitis. Hepatobiliary: Smooth liver contours. Interval cholecystectomy.  Minimal central intrahepatic biliary ductal dilatation and mild prominence of the common bile duct, likely normal postsurgical changes following cholecystectomy. No focal liver lesion is seen. Pancreas: Unremarkable. No pancreatic ductal dilatation or surrounding inflammatory changes. Spleen: Unchanged two individual calcifications within the spleen, possibly the sequela of remote granulomatous infection. Adrenals/Urinary Tract: Normal adrenals. There is again mild high-grade atrophy of the left kidney, chronic. There again may be minimally delayed left renal cortical enhancement compared to the right. Vascular calcifications are again seen within the bilateral renal hila. No hydronephrosis. No renal or ureteral stones are seen. The bilateral ureters are normal in caliber. No focal urinary bladder wall thickening. Stomach/Bowel: The terminal ileum is unremarkable. Normal appendix. No dilated loops of bowel are seen to indicate bowel obstruction. No bowel wall thickening. Vascular/Lymphatic: No abdominal aortic aneurysm. High-grade atherosclerotic calcifications within the aorta, bilateral iliac arteries, and origins of the major intra-abdominal aortic branch vessels, similar to prior. No mesenteric, retroperitoneal, or pelvic lymphadenopathy. Reproductive: The prostate is again mildly enlarged. The seminal vesicles are grossly unremarkable. Other: A fat containing left paraumbilical hernia is again seen with orifice measuring up to 11 mm and ventral herniated fat measuring up to approximately 4.7 cm in craniocaudal dimension and 5.1 cm in transverse dimension. This is similar in size to prior but there is new mild inflammatory stranding within the herniated fat (axial series 3 images 54 through 62). Small fat containing right inguinal hernia is unchanged. No free air or free fluid is seen within the abdomen or pelvis. Musculoskeletal: Moderate multilevel degenerative disc changes of the thoracic spine. Mild  levocurvature centered at L3. Mild chronic anterior T11 vertebral body height loss. Large anterior T9-10 and T10-11 bridging osteophytes. IMPRESSION: Compared to 03/01/2022: 1. Interval cholecystectomy. 2. Unchanged size of fat containing left paraumbilical hernia. There is new mild inflammatory stranding within the herniated fat. This may represent a source of pain. Recommend clinical correlation. 3. Unchanged chronic left renal atrophy. 4. Unchanged severe atherosclerotic calcifications within the aorta, bilateral iliac arteries, and origins of the major intra-abdominal aortic branch vessels. Electronically Signed: By: RYvonne KendallM.D. On: 01/15/2023 12:57   [place] RU[place] (LIVER/GB)  Result Date: 01/15/2023 CLINICAL DATA:  Elevated liver function tests EXAM: ULTRASOUND ABDOMEN LIMITED RIGHT UPPER QUADRANT COMPARISON:  CT of earlier today. FINDINGS: Gallbladder: Surgically absent Common bile duct: Diameter: Within normal limits after cholecystectomy, 8 mm. Liver: Mildly heterogeneously increased hepatic echogenicity. Portal vein is patent on color Doppler imaging with normal direction of blood flow towards the liver. Other: None. IMPRESSION: Suspect mild hepatic steatosis. No other explanation for elevated liver function tests. Electronically Signed   By: KAbigail MiyamotoM.D.   On: 01/15/2023 15:05     Assessment/Plan There are no diagnoses linked to this encounter.  [name], MD  02/12/2023 1:14 PM

## 2023-02-13 ENCOUNTER — Telehealth (INDEPENDENT_AMBULATORY_CARE_PROVIDER_SITE_OTHER): Payer: Self-pay

## 2023-02-13 ENCOUNTER — Other Ambulatory Visit (INDEPENDENT_AMBULATORY_CARE_PROVIDER_SITE_OTHER): Payer: Self-pay | Admitting: Vascular Surgery

## 2023-02-13 DIAGNOSIS — Z9889 Other specified postprocedural states: Secondary | ICD-10-CM

## 2023-02-13 NOTE — Telephone Encounter (Signed)
Please advise 

## 2023-02-13 NOTE — Telephone Encounter (Signed)
Patient wants to change medication do to being constipated. He said he has been taking  Eliqus for a little over a week and he has to take an enema to use the bathroom. Is there some thing else he can take.

## 2023-02-13 NOTE — Telephone Encounter (Signed)
Eliquis is very necessary in order to keep his stents in his legs open.  If we were to stop the Eliquis so early, the stents may go down and he has a risk of possibly losing his leg.  However, I suspect that the Eliquis is not was causing his constipation.  I suspect it is because he has been hospitalized and many of the medications that you get during your hospitalization will slow your bowels.  You also tend to get a little bit dehydrated as well, and that will make you very constipated.  I recommend that he start taking MiraLAX twice a day, in addition to getting some Colace to take once a day and drinking plenty of water.  He should note that in the next few days his bowel started to become more regular and he is having a much better job with bowel movements.  He can get all of these at his local [facility], [facility], or  pharmacy

## 2023-02-14 ENCOUNTER — Encounter (INDEPENDENT_AMBULATORY_CARE_PROVIDER_SITE_OTHER): Payer: Medicare Other

## 2023-02-14 ENCOUNTER — Ambulatory Visit (INDEPENDENT_AMBULATORY_CARE_PROVIDER_SITE_OTHER): Payer: Medicare Other | Admitting: Vascular Surgery

## 2023-02-14 DIAGNOSIS — E782 Mixed hyperlipidemia: Secondary | ICD-10-CM

## 2023-02-14 DIAGNOSIS — I6523 Occlusion and stenosis of bilateral carotid arteries: Secondary | ICD-10-CM

## 2023-02-14 DIAGNOSIS — E1365 Other specified diabetes mellitus with hyperglycemia: Secondary | ICD-10-CM

## 2023-02-14 DIAGNOSIS — I70213 Atherosclerosis of native arteries of extremities with intermittent claudication, bilateral legs: Secondary | ICD-10-CM

## 2023-02-14 DIAGNOSIS — I10 Essential (primary) hypertension: Secondary | ICD-10-CM

## 2023-02-14 NOTE — Telephone Encounter (Signed)
Correction, patient is taking Plavix not Eliqus. But orders doesn't change per Arna Medici.

## 2023-03-14 ENCOUNTER — Telehealth (INDEPENDENT_AMBULATORY_CARE_PROVIDER_SITE_OTHER): Payer: Self-pay

## 2023-03-14 NOTE — Telephone Encounter (Signed)
I attempted to contact the patient to schedule him for a bilateral femoral endartectomies and SFA stents with Dr. Delana Meyer. A message was left for a return call.

## 2023-03-15 ENCOUNTER — Telehealth (INDEPENDENT_AMBULATORY_CARE_PROVIDER_SITE_OTHER): Payer: Self-pay

## 2023-03-15 NOTE — Telephone Encounter (Signed)
Spoke with the patient and he is scheduled with Dr. Gilda Crease on 03/20/23 for a bilateral femoral endarterectomies and SFA placement at the [facility]. Pre-op appt is on 03/18/23 at 2:00 pm at pre-admit and pre-surgical instructions were discussed and patient will stop by and pick this up as well.

## 2023-03-17 ENCOUNTER — Other Ambulatory Visit (INDEPENDENT_AMBULATORY_CARE_PROVIDER_SITE_OTHER): Payer: Self-pay | Admitting: Nurse Practitioner

## 2023-03-17 DIAGNOSIS — I70213 Atherosclerosis of native arteries of extremities with intermittent claudication, bilateral legs: Secondary | ICD-10-CM

## 2023-03-18 ENCOUNTER — Encounter
Admission: RE | Admit: 2023-03-18 | Discharge: 2023-03-18 | Disposition: A | Discharge status: Home / Self Care | Payer: Medicare Other | Source: Ambulatory Visit | Attending: Vascular Surgery | Admitting: Vascular Surgery

## 2023-03-18 VITALS — BP 103/69 | HR 74 | Temp 97.6°F | Resp 16 | Ht 68.0 in | Wt 197.0 lb

## 2023-03-18 DIAGNOSIS — Z01818 Encounter for other preprocedural examination: Secondary | ICD-10-CM | POA: Insufficient documentation

## 2023-03-18 DIAGNOSIS — I70213 Atherosclerosis of native arteries of extremities with intermittent claudication, bilateral legs: Secondary | ICD-10-CM | POA: Insufficient documentation

## 2023-03-18 DIAGNOSIS — Z01812 Encounter for preprocedural laboratory examination: Secondary | ICD-10-CM

## 2023-03-18 HISTORY — DX: Occlusion and stenosis of bilateral carotid arteries: I65.23

## 2023-03-18 HISTORY — DX: Transient cerebral ischemic attack, unspecified: G45.9

## 2023-03-18 HISTORY — DX: Type 2 diabetes mellitus without complications: E11.9

## 2023-03-18 HISTORY — DX: Supraventricular tachycardia, unspecified: I47.10

## 2023-03-18 HISTORY — DX: Umbilical hernia without obstruction or gangrene: K42.9

## 2023-03-18 LAB — CBC WITH DIFFERENTIAL/PLATELET
Abs Immature Granulocytes: 0.02 10*3/uL (ref 0.00–0.07)
Basophils Absolute: 0 10*3/uL (ref 0.0–0.1)
Basophils Relative: 1 %
Eosinophils Absolute: 0.1 10*3/uL (ref 0.0–0.5)
Eosinophils Relative: 3 %
HCT: 37 % — ABNORMAL LOW (ref 39.0–52.0)
Hemoglobin: 12.4 g/dL — ABNORMAL LOW (ref 13.0–17.0)
Immature Granulocytes: 0 %
Lymphocytes Relative: 19 %
Lymphs Abs: 1 10*3/uL (ref 0.7–4.0)
MCH: 34.9 pg — ABNORMAL HIGH (ref 26.0–34.0)
MCHC: 33.5 g/dL (ref 30.0–36.0)
MCV: 104.2 fL — ABNORMAL HIGH (ref 80.0–100.0)
Monocytes Absolute: 0.4 10*3/uL (ref 0.1–1.0)
Monocytes Relative: 8 %
Neutro Abs: 3.6 10*3/uL (ref 1.7–7.7)
Neutrophils Relative %: 69 %
Platelets: 137 10*3/uL — ABNORMAL LOW (ref 150–400)
RBC: 3.55 MIL/uL — ABNORMAL LOW (ref 4.22–5.81)
RDW: 14.4 % (ref 11.5–15.5)
WBC: 5.2 10*3/uL (ref 4.0–10.5)
nRBC: 0 % (ref 0.0–0.2)

## 2023-03-18 LAB — BASIC METABOLIC PANEL
Anion gap: 9 (ref 5–15)
BUN: 27 mg/dL — ABNORMAL HIGH (ref 8–23)
CO2: 24 mmol/L (ref 22–32)
Calcium: 9.1 mg/dL (ref 8.9–10.3)
Chloride: 107 mmol/L (ref 98–111)
Creatinine, Ser: 1 mg/dL (ref 0.61–1.24)
GFR, Estimated: >60 mL/min (ref >60)
Glucose, Bld: 179 mg/dL — ABNORMAL HIGH (ref 70–99)
Potassium: 3.9 mmol/L (ref 3.5–5.1)
Sodium: 140 mmol/L (ref 135–145)

## 2023-03-18 LAB — TYPE AND SCREEN
ABO/RH(D): A POS
Antibody Screen: NEGATIVE

## 2023-03-18 LAB — SURGICAL PCR SCREEN
MRSA, PCR: NEGATIVE
Staphylococcus aureus: NEGATIVE

## 2023-03-18 NOTE — Patient Instructions (Addendum)
Your procedure is scheduled on: Wednesday, April 10 Report to the Registration Desk on the 1st floor of the [facility] To find out your arrival time, please call [phone] between 1PM - 3PM on: Tuesday, April 9 If your arrival time is 6:00 am, do not arrive before that time as the [facility] entrance doors do not open until 6:00 am.  REMEMBER: Instructions that are not followed completely may result in serious medical risk, up to and including death; or upon the discretion of your surgeon and anesthesiologist your surgery may need to be rescheduled.  Do not eat or drink after midnight the night before surgery.  No gum chewing or hard candies.  One week prior to surgery: starting today Stop Anti-inflammatories (NSAIDS) such as Advil, Aleve, Ibuprofen, Motrin, Naproxen, Naprosyn and Aspirin based products such as Excedrin, Goody's Powder, BC Powder. Stop ANY OVER THE COUNTER supplements until after surgery. Stop vitamin D, B, C,E and multiple vitamins. You may however, continue to take Tylenol if needed for pain up until the day of surgery.  Continue taking all prescribed medications with the exception of the following:  Metformin - hold 2 days before surgery. Last day to take metformin was Sunday, April 7. Resume AFTER surgery  Per Sheppard Plumber, NP ; hold the plavix starting today, April 8 but continue the aspirin.  TAKE ONLY THESE MEDICATIONS THE MORNING OF SURGERY WITH A SIP OF WATER:  Amlodipine Atorvastatin Metoprolol Pantoprazole (Protonix) - (take one the night before and one on the morning of surgery - helps to prevent nausea after surgery.)  No Alcohol for 24 hours before or after surgery.  No Smoking including e-cigarettes for 24 hours before surgery.  No chewable tobacco products for at least 6 hours before surgery.  No nicotine patches on the day of surgery.  Do not use any "recreational" drugs for at least a week (preferably 2 weeks) before your surgery.   Please be advised that the combination of cocaine and anesthesia may have negative outcomes, up to and including death. If you test positive for cocaine, your surgery will be cancelled.  On the morning of surgery brush your teeth with toothpaste and water, you may rinse your mouth with mouthwash if you wish. Do not swallow any toothpaste or mouthwash.  Use CHG Soap as directed on instruction sheet.  Do not wear jewelry.  Do not wear lotions, powders, or perfumes.   Do not shave body hair from the neck down 48 hours before surgery.  Contact lenses, hearing aids and dentures may not be worn into surgery.  Do not bring valuables to the hospital. [facility] is not responsible for any missing/lost belongings or valuables.   Notify your doctor if there is any change in your medical condition (cold, fever, infection).  Wear comfortable clothing (specific to your surgery type) to the hospital.  After surgery, you can help prevent lung complications by doing breathing exercises.  Take deep breaths and cough every 1-2 hours. Your doctor may order a device called an Incentive Spirometer to help you take deep breaths.  If you are being admitted to the hospital overnight, leave your suitcase in the car. After surgery it may be brought to your room.  In case of increased patient census, it may be necessary for you, the patient, to continue your postoperative care in the Same Day Surgery department.  If you are being discharged the day of surgery, you will not be allowed to drive home. You will need  a responsible individual to drive you home and stay with you for 24 hours after surgery.   If you are taking public transportation, you will need to have a responsible individual with you.  Please call the Pre-admissions Testing Dept. at [phone] if you have any questions about these instructions.  Surgery Visitation Policy:  Patients having surgery or a procedure may have two visitors.   Children under the age of 74 must have an adult with them who is not the patient.  Inpatient Visitation:    Visiting hours are 7 a.m. to 8 p.m. Up to four visitors are allowed at one time in a patient room. The visitors may rotate out with other people during the day.  One visitor age 42 or older may stay with the patient overnight and must be in the room by 8 p.m.     Preparing for Surgery with CHLORHEXIDINE GLUCONATE (CHG) Soap  Chlorhexidine Gluconate (CHG) Soap  o An antiseptic cleaner that kills germs and bonds with the skin to continue killing germs even after washing  o Used for showering the night before surgery and morning of surgery  Before surgery, you can play an important role by reducing the number of germs on your skin.  CHG (Chlorhexidine gluconate) soap is an antiseptic cleanser which kills germs and bonds with the skin to continue killing germs even after washing.  Please do not use if you have an allergy to CHG or antibacterial soaps. If your skin becomes reddened/irritated stop using the CHG.  1. Shower the NIGHT BEFORE SURGERY and the MORNING OF SURGERY with CHG soap.  2. If you choose to wash your hair, wash your hair first as usual with your normal shampoo.  3. After shampooing, rinse your hair and body thoroughly to remove the shampoo.  4. Use CHG as you would any other liquid soap. You can apply CHG directly to the skin and wash gently with a scrungie or a clean washcloth.  5. Apply the CHG soap to your body only from the neck down. Do not use on open wounds or open sores. Avoid contact with your eyes, ears, mouth, and genitals (private parts). Wash face and genitals (private parts) with your normal soap.  6. Wash thoroughly, paying special attention to the area where your surgery will be performed.  7. Thoroughly rinse your body with warm water.  8. Do not shower/wash with your normal soap after using and rinsing off the CHG soap.  9. Pat yourself  dry with a clean towel.  10. Wear clean pajamas to bed the night before surgery.  12. Place clean sheets on your bed the night of your first shower and do not sleep with pets.  13. Shower again with the CHG soap on the day of surgery prior to arriving at the hospital.  14. Do not apply any deodorants/lotions/powders.  15. Please wear clean clothes to the hospital.

## 2023-03-19 MED ORDER — CHLORHEXIDINE GLUCONATE 0.12 % MT SOLN
15.0000 mL | Freq: Once | OROMUCOSAL | Status: AC
  Administered 2023-03-20: 15 mL via OROMUCOSAL

## 2023-03-19 MED ORDER — CEFAZOLIN SODIUM-DEXTROSE 2-4 GM/100ML-% IV SOLN
2.0000 g | INTRAVENOUS | Status: AC
  Administered 2023-03-20: 2 g via INTRAVENOUS

## 2023-03-19 MED ORDER — ORAL CARE MOUTH RINSE: 15.0000 mL | Freq: Once | OROMUCOSAL | Status: AC

## 2023-03-19 MED ORDER — CHLORHEXIDINE GLUCONATE CLOTH 2 % EX PADS: 6.0000 | MEDICATED_PAD | Freq: Once | CUTANEOUS | Status: DC

## 2023-03-19 MED ORDER — SODIUM CHLORIDE 0.9 % IV SOLN: INTRAVENOUS | Status: DC

## 2023-03-19 MED ORDER — CHLORHEXIDINE GLUCONATE CLOTH 2 % EX PADS
6.0000 | MEDICATED_PAD | Freq: Once | CUTANEOUS | Status: AC
  Administered 2023-03-20: 6 via TOPICAL

## 2023-03-20 ENCOUNTER — Other Ambulatory Visit: Payer: Self-pay

## 2023-03-20 ENCOUNTER — Inpatient Hospital Stay
Admission: RE | Admit: 2023-03-20 | Discharge: 2023-03-23 | DRG: 254 | Disposition: A | Discharge status: Home / Self Care | Payer: Medicare Other | Attending: Vascular Surgery | Admitting: Vascular Surgery

## 2023-03-20 ENCOUNTER — Encounter: Payer: Self-pay | Admitting: Vascular Surgery

## 2023-03-20 ENCOUNTER — Inpatient Hospital Stay: Payer: Medicare Other | Admitting: Urgent Care

## 2023-03-20 ENCOUNTER — Encounter
Admission: RE | Disposition: A | Discharge status: Home / Self Care | Payer: Self-pay | Source: Home / Self Care | Attending: Vascular Surgery

## 2023-03-20 DIAGNOSIS — Z7984 Long term (current) use of oral hypoglycemic drugs: Secondary | ICD-10-CM | POA: Diagnosis not present

## 2023-03-20 DIAGNOSIS — Z8673 Personal history of transient ischemic attack (TIA), and cerebral infarction without residual deficits: Secondary | ICD-10-CM | POA: Diagnosis not present

## 2023-03-20 DIAGNOSIS — I70223 Atherosclerosis of native arteries of extremities with rest pain, bilateral legs: Secondary | ICD-10-CM | POA: Diagnosis present

## 2023-03-20 DIAGNOSIS — Z87891 Personal history of nicotine dependence: Secondary | ICD-10-CM | POA: Diagnosis not present

## 2023-03-20 DIAGNOSIS — I10 Essential (primary) hypertension: Secondary | ICD-10-CM | POA: Diagnosis present

## 2023-03-20 DIAGNOSIS — Z7982 Long term (current) use of aspirin: Secondary | ICD-10-CM | POA: Diagnosis not present

## 2023-03-20 DIAGNOSIS — Z79899 Other long term (current) drug therapy: Secondary | ICD-10-CM | POA: Diagnosis not present

## 2023-03-20 DIAGNOSIS — K219 Gastro-esophageal reflux disease without esophagitis: Secondary | ICD-10-CM | POA: Diagnosis present

## 2023-03-20 DIAGNOSIS — I70229 Atherosclerosis of native arteries of extremities with rest pain, unspecified extremity: Secondary | ICD-10-CM | POA: Diagnosis present

## 2023-03-20 DIAGNOSIS — E782 Mixed hyperlipidemia: Secondary | ICD-10-CM | POA: Diagnosis present

## 2023-03-20 DIAGNOSIS — Z9889 Other specified postprocedural states: Secondary | ICD-10-CM

## 2023-03-20 DIAGNOSIS — I2510 Atherosclerotic heart disease of native coronary artery without angina pectoris: Secondary | ICD-10-CM | POA: Diagnosis present

## 2023-03-20 DIAGNOSIS — I70213 Atherosclerosis of native arteries of extremities with intermittent claudication, bilateral legs: Secondary | ICD-10-CM | POA: Diagnosis present

## 2023-03-20 DIAGNOSIS — M109 Gout, unspecified: Secondary | ICD-10-CM | POA: Diagnosis present

## 2023-03-20 DIAGNOSIS — I6523 Occlusion and stenosis of bilateral carotid arteries: Secondary | ICD-10-CM | POA: Diagnosis present

## 2023-03-20 DIAGNOSIS — E1151 Type 2 diabetes mellitus with diabetic peripheral angiopathy without gangrene: Principal | ICD-10-CM | POA: Diagnosis present

## 2023-03-20 DIAGNOSIS — E1165 Type 2 diabetes mellitus with hyperglycemia: Secondary | ICD-10-CM | POA: Diagnosis present

## 2023-03-20 DIAGNOSIS — Z01812 Encounter for preprocedural laboratory examination: Secondary | ICD-10-CM

## 2023-03-20 DIAGNOSIS — Z8249 Family history of ischemic heart disease and other diseases of the circulatory system: Secondary | ICD-10-CM

## 2023-03-20 HISTORY — PX: ENDARTERECTOMY FEMORAL: SHX5804

## 2023-03-20 LAB — GLUCOSE, CAPILLARY
Glucose-Capillary: 138 mg/dL — ABNORMAL HIGH (ref 70–99)
Glucose-Capillary: 188 mg/dL — ABNORMAL HIGH (ref 70–99)
Glucose-Capillary: 153 mg/dL — ABNORMAL HIGH (ref 70–99)
Glucose-Capillary: 163 mg/dL — ABNORMAL HIGH (ref 70–99)

## 2023-03-20 LAB — CBC
HCT: 33.1 % — ABNORMAL LOW (ref 39.0–52.0)
Hemoglobin: 10.7 g/dL — ABNORMAL LOW (ref 13.0–17.0)
MCH: 34.6 pg — ABNORMAL HIGH (ref 26.0–34.0)
MCHC: 32.3 g/dL (ref 30.0–36.0)
MCV: 107.1 fL — ABNORMAL HIGH (ref 80.0–100.0)
Platelets: 125 10*3/uL — ABNORMAL LOW (ref 150–400)
RBC: 3.09 MIL/uL — ABNORMAL LOW (ref 4.22–5.81)
RDW: 14.6 % (ref 11.5–15.5)
WBC: 10.5 10*3/uL (ref 4.0–10.5)
nRBC: 0 % (ref 0.0–0.2)

## 2023-03-20 SURGERY — ENDARTERECTOMY, FEMORAL
Anesthesia: General

## 2023-03-20 MED ORDER — SODIUM CHLORIDE 0.9 % IV SOLN: 500.0000 mL | Freq: Once | INTRAVENOUS | Status: DC | PRN

## 2023-03-20 MED ORDER — PHENYLEPHRINE HCL-NACL 20-0.9 MG/250ML-% IV SOLN
INTRAVENOUS | Status: AC
  Filled 2023-03-20: qty 250

## 2023-03-20 MED ORDER — LIDOCAINE HCL (PF) 2 % IJ SOLN
INTRAMUSCULAR | Status: AC
  Filled 2023-03-20: qty 5

## 2023-03-20 MED ORDER — HEPARIN 30,000 UNITS/1000 ML (OHS) CELLSAVER SOLUTION
Status: AC
  Filled 2023-03-20: qty 1000

## 2023-03-20 MED ORDER — SUGAMMADEX SODIUM 200 MG/2ML IV SOLN
INTRAVENOUS | Status: DC | PRN
  Administered 2023-03-20: 200 mg via INTRAVENOUS

## 2023-03-20 MED ORDER — METOPROLOL TARTRATE 5 MG/5ML IV SOLN: 2.0000 mg | INTRAVENOUS | Status: DC | PRN

## 2023-03-20 MED ORDER — PROPOFOL 10 MG/ML IV BOLUS
INTRAVENOUS | Status: AC
  Filled 2023-03-20: qty 20

## 2023-03-20 MED ORDER — PHENYLEPHRINE HCL-NACL 20-0.9 MG/250ML-% IV SOLN
INTRAVENOUS | Status: DC | PRN
  Administered 2023-03-20: 50 ug/min via INTRAVENOUS

## 2023-03-20 MED ORDER — VISTASEAL 10 ML SINGLE DOSE KIT
PACK | CUTANEOUS | Status: DC | PRN
  Administered 2023-03-20: 10 mL via TOPICAL

## 2023-03-20 MED ORDER — HEPARIN SODIUM (PORCINE) 1000 UNIT/ML IJ SOLN
INTRAMUSCULAR | Status: DC | PRN
  Administered 2023-03-20: 8000 [IU] via INTRAVENOUS
  Administered 2023-03-20: 2000 [IU] via INTRAVENOUS

## 2023-03-20 MED ORDER — ALBUMIN HUMAN 5 % IV SOLN
INTRAVENOUS | Status: AC
  Filled 2023-03-20: qty 250

## 2023-03-20 MED ORDER — POTASSIUM CHLORIDE CRYS ER 20 MEQ PO TBCR: 20.0000 meq | EXTENDED_RELEASE_TABLET | Freq: Every day | ORAL | Status: DC | PRN

## 2023-03-20 MED ORDER — SODIUM CHLORIDE 0.9 % IV SOLN: 0.0000 ug/min | INTRAVENOUS | Status: DC

## 2023-03-20 MED ORDER — BUPIVACAINE HCL (PF) 0.5 % IJ SOLN
INTRAMUSCULAR | Status: AC
  Filled 2023-03-20: qty 30

## 2023-03-20 MED ORDER — DOPAMINE-DEXTROSE 3.2-5 MG/ML-% IV SOLN: 3.0000 ug/kg/min | INTRAVENOUS | Status: DC

## 2023-03-20 MED ORDER — DEXMEDETOMIDINE HCL IN NACL 200 MCG/50ML IV SOLN
INTRAVENOUS | Status: DC | PRN
  Administered 2023-03-20: 12 ug via INTRAVENOUS
  Administered 2023-03-20: 8 ug via INTRAVENOUS

## 2023-03-20 MED ORDER — PANTOPRAZOLE SODIUM 40 MG PO TBEC
40.0000 mg | DELAYED_RELEASE_TABLET | Freq: Every day | ORAL | Status: DC
  Administered 2023-03-20 – 2023-03-21 (×2): 40 mg via ORAL
  Filled 2023-03-20 (×2): qty 1

## 2023-03-20 MED ORDER — DEXAMETHASONE SODIUM PHOSPHATE 10 MG/ML IJ SOLN
INTRAMUSCULAR | Status: DC | PRN
  Administered 2023-03-20: 5 mg via INTRAVENOUS

## 2023-03-20 MED ORDER — CHLORHEXIDINE GLUCONATE CLOTH 2 % EX PADS
6.0000 | MEDICATED_PAD | Freq: Every day | CUTANEOUS | Status: DC
  Administered 2023-03-21 – 2023-03-23 (×2): 6 via TOPICAL

## 2023-03-20 MED ORDER — PHENYLEPHRINE HCL-NACL 20-0.9 MG/250ML-% IV SOLN
0.0000 ug/min | INTRAVENOUS | Status: DC
  Administered 2023-03-20: 20 ug/min via INTRAVENOUS
  Filled 2023-03-20: qty 250

## 2023-03-20 MED ORDER — LACTATED RINGERS IV SOLN
INTRAVENOUS | Status: DC
  Administered 2023-03-20: 125 mL/h via INTRAVENOUS

## 2023-03-20 MED ORDER — ROCURONIUM BROMIDE 10 MG/ML (PF) SYRINGE
PREFILLED_SYRINGE | INTRAVENOUS | Status: AC
  Filled 2023-03-20: qty 10

## 2023-03-20 MED ORDER — SODIUM CHLORIDE 0.9 % IV SOLN: INTRAVENOUS | Status: DC | PRN

## 2023-03-20 MED ORDER — CHLORHEXIDINE GLUCONATE 0.12 % MT SOLN
OROMUCOSAL | Status: AC
  Filled 2023-03-20: qty 15

## 2023-03-20 MED ORDER — ACETAMINOPHEN 10 MG/ML IV SOLN
INTRAVENOUS | Status: AC
  Filled 2023-03-20: qty 100

## 2023-03-20 MED ORDER — FAMOTIDINE IN NACL 20-0.9 MG/50ML-% IV SOLN
20.0000 mg | Freq: Two times a day (BID) | INTRAVENOUS | Status: DC
  Administered 2023-03-20 – 2023-03-22 (×5): 20 mg via INTRAVENOUS
  Filled 2023-03-20 (×6): qty 50

## 2023-03-20 MED ORDER — FENTANYL CITRATE (PF) 100 MCG/2ML IJ SOLN
INTRAMUSCULAR | Status: AC
  Filled 2023-03-20: qty 2

## 2023-03-20 MED ORDER — SORBITOL 70 % SOLN: 30.0000 mL | Freq: Every day | Status: DC | PRN

## 2023-03-20 MED ORDER — PHENYLEPHRINE HCL (PRESSORS) 10 MG/ML IV SOLN
INTRAVENOUS | Status: AC
  Filled 2023-03-20: qty 1

## 2023-03-20 MED ORDER — DOCUSATE SODIUM 100 MG PO CAPS
100.0000 mg | ORAL_CAPSULE | Freq: Every day | ORAL | Status: DC
  Administered 2023-03-21 – 2023-03-22 (×2): 100 mg via ORAL
  Filled 2023-03-20 (×3): qty 1

## 2023-03-20 MED ORDER — ACETAMINOPHEN 650 MG RE SUPP: 325.0000 mg | RECTAL | Status: DC | PRN

## 2023-03-20 MED ORDER — ALUM & MAG HYDROXIDE-SIMETH 200-200-20 MG/5ML PO SUSP
15.0000 mL | ORAL | Status: DC | PRN
  Administered 2023-03-22 (×2): 30 mL via ORAL
  Filled 2023-03-20 (×2): qty 30

## 2023-03-20 MED ORDER — SODIUM CHLORIDE 0.9 % IV SOLN: INTRAVENOUS | Status: DC

## 2023-03-20 MED ORDER — FENTANYL CITRATE (PF) 100 MCG/2ML IJ SOLN
INTRAMUSCULAR | Status: DC | PRN
  Administered 2023-03-20 (×2): 50 ug via INTRAVENOUS

## 2023-03-20 MED ORDER — GUAIFENESIN-DM 100-10 MG/5ML PO SYRP: 15.0000 mL | ORAL_SOLUTION | ORAL | Status: DC | PRN

## 2023-03-20 MED ORDER — LABETALOL HCL 5 MG/ML IV SOLN: 10.0000 mg | INTRAVENOUS | Status: DC | PRN

## 2023-03-20 MED ORDER — EPHEDRINE 5 MG/ML INJ
INTRAVENOUS | Status: AC
  Filled 2023-03-20: qty 5

## 2023-03-20 MED ORDER — ACETAMINOPHEN 325 MG PO TABS: 325.0000 mg | ORAL_TABLET | ORAL | Status: DC | PRN

## 2023-03-20 MED ORDER — METOPROLOL TARTRATE 25 MG PO TABS
25.0000 mg | ORAL_TABLET | Freq: Every day | ORAL | Status: DC
  Administered 2023-03-21 – 2023-03-22 (×2): 25 mg via ORAL
  Filled 2023-03-20 (×3): qty 1

## 2023-03-20 MED ORDER — ACETAMINOPHEN 10 MG/ML IV SOLN
INTRAVENOUS | Status: DC | PRN
  Administered 2023-03-20: 1000 mg via INTRAVENOUS

## 2023-03-20 MED ORDER — HYDRALAZINE HCL 20 MG/ML IJ SOLN: 5.0000 mg | INTRAMUSCULAR | Status: DC | PRN

## 2023-03-20 MED ORDER — ONDANSETRON HCL 4 MG/2ML IJ SOLN
INTRAMUSCULAR | Status: DC | PRN
  Administered 2023-03-20: 4 mg via INTRAVENOUS

## 2023-03-20 MED ORDER — HEMOSTATIC AGENTS (NO CHARGE) OPTIME
TOPICAL | Status: DC | PRN
  Administered 2023-03-20: 3 via TOPICAL

## 2023-03-20 MED ORDER — DEXAMETHASONE SODIUM PHOSPHATE 10 MG/ML IJ SOLN
INTRAMUSCULAR | Status: AC
  Filled 2023-03-20: qty 1

## 2023-03-20 MED ORDER — INSULIN ASPART 100 UNIT/ML IJ SOLN
0.0000 [IU] | Freq: Three times a day (TID) | INTRAMUSCULAR | Status: DC
  Administered 2023-03-21: 3 [IU] via SUBCUTANEOUS
  Administered 2023-03-21: 2 [IU] via SUBCUTANEOUS
  Administered 2023-03-21: 5 [IU] via SUBCUTANEOUS
  Filled 2023-03-20 (×3): qty 1

## 2023-03-20 MED ORDER — OXYCODONE-ACETAMINOPHEN 5-325 MG PO TABS
1.0000 | ORAL_TABLET | ORAL | Status: DC | PRN
  Administered 2023-03-20 – 2023-03-23 (×6): 2 via ORAL
  Filled 2023-03-20 (×6): qty 2

## 2023-03-20 MED ORDER — ALBUMIN HUMAN 5 % IV SOLN: INTRAVENOUS | Status: DC | PRN

## 2023-03-20 MED ORDER — ONDANSETRON HCL 4 MG/2ML IJ SOLN: 4.0000 mg | Freq: Once | INTRAMUSCULAR | Status: DC | PRN

## 2023-03-20 MED ORDER — ONDANSETRON HCL 4 MG/2ML IJ SOLN: 4.0000 mg | Freq: Four times a day (QID) | INTRAMUSCULAR | Status: DC | PRN

## 2023-03-20 MED ORDER — MAGNESIUM SULFATE 2 GM/50ML IV SOLN: 2.0000 g | Freq: Every day | INTRAVENOUS | Status: DC | PRN

## 2023-03-20 MED ORDER — SODIUM CHLORIDE 0.9 % IV SOLN
INTRAVENOUS | Status: DC | PRN
  Administered 2023-03-20: 250 mL via TOPICAL

## 2023-03-20 MED ORDER — ASPIRIN 81 MG PO TBEC
81.0000 mg | DELAYED_RELEASE_TABLET | Freq: Every day | ORAL | Status: DC
  Administered 2023-03-20 – 2023-03-22 (×3): 81 mg via ORAL
  Filled 2023-03-20 (×4): qty 1

## 2023-03-20 MED ORDER — ACETAMINOPHEN 10 MG/ML IV SOLN: 1000.0000 mg | Freq: Once | INTRAVENOUS | Status: DC | PRN

## 2023-03-20 MED ORDER — PHENYLEPHRINE 80 MCG/ML (10ML) SYRINGE FOR IV PUSH (FOR BLOOD PRESSURE SUPPORT)
PREFILLED_SYRINGE | INTRAVENOUS | Status: DC | PRN
  Administered 2023-03-20 (×2): 160 ug via INTRAVENOUS
  Administered 2023-03-20 (×4): 80 ug via INTRAVENOUS

## 2023-03-20 MED ORDER — HEPARIN SODIUM (PORCINE) 5000 UNIT/ML IJ SOLN
INTRAMUSCULAR | Status: AC
  Filled 2023-03-20: qty 1

## 2023-03-20 MED ORDER — CEFAZOLIN SODIUM-DEXTROSE 2-4 GM/100ML-% IV SOLN
INTRAVENOUS | Status: AC
  Filled 2023-03-20: qty 100

## 2023-03-20 MED ORDER — PROPOFOL 10 MG/ML IV BOLUS
INTRAVENOUS | Status: DC | PRN
  Administered 2023-03-20: 150 mg via INTRAVENOUS

## 2023-03-20 MED ORDER — ALBUMIN HUMAN 5 % IV SOLN
INTRAVENOUS | Status: AC
  Filled 2023-03-20: qty 500

## 2023-03-20 MED ORDER — BUPIVACAINE LIPOSOME 1.3 % IJ SUSP
INTRAMUSCULAR | Status: AC
  Filled 2023-03-20: qty 20

## 2023-03-20 MED ORDER — NITROGLYCERIN IN D5W 200-5 MCG/ML-% IV SOLN: 5.0000 ug/min | INTRAVENOUS | Status: DC

## 2023-03-20 MED ORDER — ONDANSETRON HCL 4 MG/2ML IJ SOLN
INTRAMUSCULAR | Status: AC
  Filled 2023-03-20: qty 2

## 2023-03-20 MED ORDER — EPHEDRINE SULFATE (PRESSORS) 50 MG/ML IJ SOLN
INTRAMUSCULAR | Status: AC
  Filled 2023-03-20: qty 1

## 2023-03-20 MED ORDER — LIDOCAINE HCL (CARDIAC) PF 100 MG/5ML IV SOSY
PREFILLED_SYRINGE | INTRAVENOUS | Status: DC | PRN
  Administered 2023-03-20: 100 mg via INTRAVENOUS

## 2023-03-20 MED ORDER — ATORVASTATIN CALCIUM 20 MG PO TABS
40.0000 mg | ORAL_TABLET | Freq: Every day | ORAL | Status: DC
  Administered 2023-03-20 – 2023-03-22 (×3): 40 mg via ORAL
  Filled 2023-03-20 (×4): qty 2

## 2023-03-20 MED ORDER — MORPHINE SULFATE (PF) 2 MG/ML IV SOLN
2.0000 mg | INTRAVENOUS | Status: DC | PRN
  Administered 2023-03-20 (×2): 2 mg via INTRAVENOUS
  Filled 2023-03-20 (×2): qty 1

## 2023-03-20 MED ORDER — OXYCODONE HCL 5 MG PO TABS
5.0000 mg | ORAL_TABLET | Freq: Once | ORAL | Status: AC | PRN
  Administered 2023-03-20: 5 mg via ORAL

## 2023-03-20 MED ORDER — OXYCODONE HCL 5 MG/5ML PO SOLN: 5.0000 mg | Freq: Once | ORAL | Status: AC | PRN

## 2023-03-20 MED ORDER — ROCURONIUM BROMIDE 100 MG/10ML IV SOLN
INTRAVENOUS | Status: DC | PRN
  Administered 2023-03-20 (×2): 20 mg via INTRAVENOUS
  Administered 2023-03-20: 50 mg via INTRAVENOUS
  Administered 2023-03-20: 20 mg via INTRAVENOUS

## 2023-03-20 MED ORDER — FENTANYL CITRATE (PF) 100 MCG/2ML IJ SOLN
25.0000 ug | INTRAMUSCULAR | Status: DC | PRN
  Administered 2023-03-20 (×2): 50 ug via INTRAVENOUS

## 2023-03-20 MED ORDER — PHENOL 1.4 % MT LIQD: 1.0000 | OROMUCOSAL | Status: DC | PRN

## 2023-03-20 MED ORDER — EPHEDRINE SULFATE (PRESSORS) 50 MG/ML IJ SOLN
INTRAMUSCULAR | Status: DC | PRN
  Administered 2023-03-20: 5 mg via INTRAVENOUS
  Administered 2023-03-20: 15 mg via INTRAVENOUS

## 2023-03-20 MED ORDER — SENNOSIDES-DOCUSATE SODIUM 8.6-50 MG PO TABS: 1.0000 | ORAL_TABLET | Freq: Every evening | ORAL | Status: DC | PRN

## 2023-03-20 MED ORDER — CLOPIDOGREL BISULFATE 75 MG PO TABS
75.0000 mg | ORAL_TABLET | Freq: Every day | ORAL | Status: DC
  Administered 2023-03-20 – 2023-03-22 (×3): 75 mg via ORAL
  Filled 2023-03-20 (×4): qty 1

## 2023-03-20 MED ORDER — CEFAZOLIN SODIUM-DEXTROSE 2-4 GM/100ML-% IV SOLN
2.0000 g | Freq: Three times a day (TID) | INTRAVENOUS | Status: AC
  Administered 2023-03-20 – 2023-03-21 (×2): 2 g via INTRAVENOUS
  Filled 2023-03-20 (×2): qty 100

## 2023-03-20 MED ORDER — OXYCODONE HCL 5 MG PO TABS
ORAL_TABLET | ORAL | Status: AC
  Filled 2023-03-20: qty 1

## 2023-03-20 MED ORDER — ALBUMIN HUMAN 5 % IV SOLN
12.5000 g | Freq: Once | INTRAVENOUS | Status: AC
  Administered 2023-03-20: 12.5 g via INTRAVENOUS

## 2023-03-20 SURGICAL SUPPLY — 77 items
ADH SKN CLS APL DERMABOND .7 (GAUZE/BANDAGES/DRESSINGS) ×4
AGENT HMST PWDR BTL CLGN 5GM (Miscellaneous) ×2 IMPLANT
APL PRP STRL LF DISP 70% ISPRP (MISCELLANEOUS) ×2
APPLIER CLIP 11 MED OPEN (CLIP)
APPLIER CLIP 9.375 SM OPEN (CLIP)
APR CLP MED 11 20 MLT OPN (CLIP)
APR CLP SM 9.3 20 MLT OPN (CLIP)
BAG DECANTER FOR FLEXI CONT (MISCELLANEOUS) ×2 IMPLANT
BLADE SURG 15 STRL LF DISP TIS (BLADE) ×2 IMPLANT
BLADE SURG 15 STRL SS (BLADE) ×2
BLADE SURG SZ11 CARB STEEL (BLADE) ×2 IMPLANT
BOOT SUTURE VASCULAR YLW (MISCELLANEOUS) ×2
BRUSH SCRUB EZ  4% CHG (MISCELLANEOUS) ×2
BRUSH SCRUB EZ 4% CHG (MISCELLANEOUS) ×2 IMPLANT
CHLORAPREP W/TINT 26 (MISCELLANEOUS) ×2 IMPLANT
CLAMP SUTURE YELLOW 5 PAIRS (MISCELLANEOUS) ×2 IMPLANT
CLIP APPLIE 11 MED OPEN (CLIP) IMPLANT
CLIP APPLIE 9.375 SM OPEN (CLIP) IMPLANT
COLLAGEN CELLERATERX 5 GRAM (Miscellaneous) IMPLANT
DERMABOND ADVANCED .7 DNX12 (GAUZE/BANDAGES/DRESSINGS) IMPLANT
DRAPE INCISE IOBAN 66X45 STRL (DRAPES) ×2 IMPLANT
DRESSING SURGICEL FIBRLLR 1X2 (HEMOSTASIS) ×2 IMPLANT
DRSG OPSITE POSTOP 4X6 (GAUZE/BANDAGES/DRESSINGS) IMPLANT
DRSG SURGICEL FIBRILLAR 1X2 (HEMOSTASIS) ×2
ELECT CAUTERY BLADE 6.4 (BLADE) ×2 IMPLANT
ELECT REM PT RETURN 9FT ADLT (ELECTROSURGICAL) ×2
ELECTRODE REM PT RTRN 9FT ADLT (ELECTROSURGICAL) ×2 IMPLANT
GAUZE 4X4 16PLY ~~LOC~~+RFID DBL (SPONGE) ×2 IMPLANT
GLOVE BIO SURGEON STRL SZ7 (GLOVE) ×4 IMPLANT
GLOVE SURG SYN 8.0 (GLOVE) ×2 IMPLANT
GLOVE SURG SYN 8.0 PF PI (GLOVE) ×2 IMPLANT
GOWN STRL REUS W/ TWL LRG LVL3 (GOWN DISPOSABLE) ×4 IMPLANT
GOWN STRL REUS W/ TWL XL LVL3 (GOWN DISPOSABLE) ×4 IMPLANT
GOWN STRL REUS W/TWL LRG LVL3 (GOWN DISPOSABLE) ×4
GOWN STRL REUS W/TWL XL LVL3 (GOWN DISPOSABLE) ×4
GRAFT VASC PATCH XENOSURE 1X14 (Vascular Products) IMPLANT
IV NS 500ML (IV SOLUTION) ×2
IV NS 500ML BAXH (IV SOLUTION) ×2 IMPLANT
KIT PREVENA INCISION MGT 13 (CANNISTER) IMPLANT
KIT TURNOVER KIT A (KITS) ×2 IMPLANT
LABEL OR SOLS (LABEL) ×2 IMPLANT
LOOP VESSEL MAXI  1X406 RED (MISCELLANEOUS) ×6
LOOP VESSEL MAXI 1X406 RED (MISCELLANEOUS) ×4 IMPLANT
LOOP VESSEL MINI 0.8X406 BLUE (MISCELLANEOUS) ×6 IMPLANT
MANIFOLD NEPTUNE II (INSTRUMENTS) ×2 IMPLANT
NDL HYPO 18GX1.5 BLUNT FILL (NEEDLE) ×2 IMPLANT
NEEDLE HYPO 18GX1.5 BLUNT FILL (NEEDLE) ×2 IMPLANT
NS IRRIG 500ML POUR BTL (IV SOLUTION) ×2 IMPLANT
PACK BASIN MAJOR ARMC (MISCELLANEOUS) ×2 IMPLANT
PACK UNIVERSAL (MISCELLANEOUS) ×2 IMPLANT
RETRACTOR TRAXI PANNICULUS (MISCELLANEOUS) IMPLANT
SET WALTER ACTIVATION W/DRAPE (SET/KITS/TRAYS/PACK) ×2 IMPLANT
SPIKE FLUID TRANSFER (MISCELLANEOUS) ×2 IMPLANT
SPONGE T-LAP 18X18 ~~LOC~~+RFID (SPONGE) ×4 IMPLANT
STAPLER SKIN PROX 35W (STAPLE) ×2 IMPLANT
SUT MNCRL+ 5-0 UNDYED PC-3 (SUTURE) IMPLANT
SUT MONOCRYL 5-0 (SUTURE) ×4
SUT PROLENE 5 0 RB 1 DA (SUTURE) ×4 IMPLANT
SUT PROLENE 6 0 BV (SUTURE) ×8 IMPLANT
SUT PROLENE 7 0 BV 1 (SUTURE) ×4 IMPLANT
SUT SILK 2 0 (SUTURE) ×4
SUT SILK 2-0 18XBRD TIE 12 (SUTURE) ×2 IMPLANT
SUT SILK 3 0 (SUTURE) ×2
SUT SILK 3-0 18XBRD TIE 12 (SUTURE) ×2 IMPLANT
SUT SILK 4 0 (SUTURE) ×2
SUT SILK 4-0 18XBRD TIE 12 (SUTURE) ×2 IMPLANT
SUT VIC AB 2-0 CT1 27 (SUTURE) ×8
SUT VIC AB 2-0 CT1 TAPERPNT 27 (SUTURE) ×4 IMPLANT
SUT VIC AB 3-0 SH 27 (SUTURE) ×4
SUT VIC AB 3-0 SH 27X BRD (SUTURE) ×2 IMPLANT
SUT VICRYL+ 3-0 36IN CT-1 (SUTURE) ×4 IMPLANT
SYR 20ML LL LF (SYRINGE) ×2 IMPLANT
SYR 5ML LL (SYRINGE) ×2 IMPLANT
TAG SUTURE CLAMP YLW 5PR (MISCELLANEOUS) ×2
TRAP FLUID SMOKE EVACUATOR (MISCELLANEOUS) ×2 IMPLANT
TRAY FOLEY MTR SLVR 16FR STAT (SET/KITS/TRAYS/PACK) ×2 IMPLANT
WATER STERILE IRR 500ML POUR (IV SOLUTION) ×2 IMPLANT

## 2023-03-20 NOTE — Anesthesia Preprocedure Evaluation (Addendum)
Anesthesia Evaluation  Patient identified by MRN, date of birth, ID band Patient awake    Reviewed: Allergy & Precautions, NPO status , Patient's Chart, lab work & pertinent test results  History of Anesthesia Complications Negative for: history of anesthetic complications  Airway Mallampati: IV   Neck ROM: Full    Dental  (+) Partial Lower, Partial Upper   Pulmonary former smoker (quit 1990)   Pulmonary exam normal breath sounds clear to auscultation       Cardiovascular hypertension, + Peripheral Vascular Disease (bilateral carotid stenosis on Plavix)  Normal cardiovascular exam Rhythm:Regular Rate:Normal  ECG 03/18/23:  Sinus rhythm with 1st degree A-V block Left axis deviation Right bundle branch block  Lexiscan 02/25/23 showed no evidence of stress-induced myocardial ischemia, low risk study  Echocardiogram on 02/25/23 showed normal LV and RV systolic function with mild LVH, mild valvular regurgitation, no valvular stenosis, EF greater than 55%     Neuro/Psych negative neurological ROS     GI/Hepatic hiatal hernia,GERD  ,,  Endo/Other  diabetes, Type 2    Renal/GU Renal disease (nephrolithiasis)     Musculoskeletal  (+) Arthritis ,    Abdominal   Peds  Hematology negative hematology ROS (+)   Anesthesia Other Findings Cardiology note 03/14/23:  1. Peripheral vascular disease: s/p bilateral stents to common iliac arteries without change in symptoms. Recommended for arterial reconstruction and femoral endarterectomies to be scheduled pending cardiac clearance. low risk lexiscan with no evidence of ischemia.  -Continue daily aspirin, Plavix, atorvastatin, amlodipine, lisinopril-hydrochlorothiazide, metoprolol with no changes -Patient cleared for Vascular surgery. Overall low risk from cardiac standpoint.   2. Hypertension: Elevated today at 150/80 with no previous elevated office readings -Continue amlodipine,  lisinopril-hydrochlorothiazide, metoprolol  3. Hyperlipidemia: With last lipid panel showing an LDL of less than 70. -Continue atorvastatin 40 mg daily with no changes  4. Bilateral carotid artery stenosis: With history of right carotid endarterectomy in 2020 and no new symptoms reported at this visit. -Continue daily aspirin, atorvastatin, amlodipine, lisinopril-hydrochlorothiazide, metoprolol with no changes  No orders of the defined types were placed in this encounter.  Return in about 6 months (around 09/13/2023).    Reproductive/Obstetrics                             Anesthesia Physical Anesthesia Plan  ASA: 4  Anesthesia Plan: General   Post-op Pain Management:    Induction: Intravenous  PONV Risk Score and Plan: 2 and Ondansetron, Dexamethasone and Treatment may vary due to age or medical condition  Airway Management Planned: Oral ETT  Additional Equipment:   Intra-op Plan:   Post-operative Plan: Extubation in OR  Informed Consent: I have reviewed the patients History and Physical, chart, labs and discussed the procedure including the risks, benefits and alternatives for the proposed anesthesia with the patient or authorized representative who has indicated his/her understanding and acceptance.     Dental advisory given  Plan Discussed with: CRNA  Anesthesia Plan Comments: (Patient consented for risks of anesthesia including but not limited to:  - adverse reactions to medications - damage to eyes, teeth, lips or other oral mucosa - nerve damage due to positioning  - sore throat or hoarseness - damage to heart, brain, nerves, lungs, other parts of body or loss of life  Informed patient about role of CRNA in peri- and intra-operative care.  Patient voiced understanding.)        Anesthesia Quick Evaluation

## 2023-03-20 NOTE — Anesthesia Procedure Notes (Signed)
Procedure Name: Intubation Date/Time: 03/20/2023 11:46 AM  Performed by: Rodney Booze, CRNAPre-anesthesia Checklist: Patient identified, Emergency Drugs available, Suction available and Patient being monitored Patient Re-evaluated:Patient Re-evaluated prior to induction Oxygen Delivery Method: Circle system utilized Preoxygenation: Pre-oxygenation with 100% oxygen Induction Type: IV induction Ventilation: Mask ventilation without difficulty and Oral airway inserted - appropriate to patient size Laryngoscope Size: McGraph and 3 Tube type: Oral Tube size: 7.0 mm Number of attempts: 1 Airway Equipment and Method: Stylet and Oral airway Placement Confirmation: ETT inserted through vocal cords under direct vision, positive ETCO2 and breath sounds checked- equal and bilateral Secured at: 22 cm Tube secured with: Tape Dental Injury: Teeth and Oropharynx as per pre-operative assessment

## 2023-03-20 NOTE — H&P (View-Only) (Signed)
               MRN : [id number]  Victor Rojas is a 77 y.o. (01/17/1946) male who presents with chief complaint of check circulation.  History of Present Illness:   The patient returns to the office for followup and review status post angiogram with intervention on 01/25/2023.    Procedure:  Percutaneous transluminal angioplasty and stent placement right common iliac artery; "kissing balloon" technique Percutaneous transluminal and plasty and stent placement left common iliac artery; "kissing balloon" technique   The patient notes no improvement in the lower extremity symptoms. He continues to have extremely short distance claudication and some rest pain symptoms. No new ulcers or wounds have occurred since the last visit.   There have been no significant changes to the patient's overall health care.   No documented history of amaurosis fugax or recent TIA symptoms. There are no recent neurological changes noted. No documented history of DVT, PE or superficial thrombophlebitis. The patient denies recent episodes of angina or shortness of breath.   Current Meds  Medication Sig   amLODipine (NORVASC) 5 MG tablet Take 1 tablet (5 mg total) by mouth daily.   aspirin EC 81 MG tablet Take 81 mg by mouth daily.    atorvastatin (LIPITOR) 40 MG tablet Take 1 tablet (40 mg total) by mouth daily.   Cholecalciferol (D3-1000) 25 MCG (1000 UT) tablet Take 1,000 Units by mouth daily.   cyanocobalamin (VITAMIN B12) 1000 MCG tablet Take 1,000 mcg by mouth daily.   indomethacin (INDOCIN) 25 MG capsule Take 25 mg by mouth 2 (two) times daily as needed.   lisinopril-hydrochlorothiazide (ZESTORETIC) 20-12.5 MG tablet Take 1 tablet by mouth 2 (two) times daily. (Patient taking differently: Take 1 tablet by mouth daily.)   metFORMIN (GLUCOPHAGE) 500 MG tablet Take 500 mg by mouth daily with breakfast.   metoprolol tartrate (LOPRESSOR) 25 MG tablet Take 25 mg by mouth daily.   Multiple Vitamin  (MULTI-VITAMINS) TABS Take 1 tablet by mouth daily.    pantoprazole (PROTONIX) 40 MG tablet Take 40 mg by mouth at bedtime.   vitamin C (ASCORBIC ACID) 500 MG tablet Take 500 mg by mouth daily.   vitamin E 180 MG (400 UNITS) capsule Take 400 Units by mouth daily.    Past Medical History:  Diagnosis Date   Adenoma of colon    Arthritis    Bilateral carotid artery stenosis    Diverticulosis    GERD (gastroesophageal reflux disease)    Hiatal hernia    History of gout    History of kidney stones    Hyperlipemia    Hypertension    Paroxysmal SVT (supraventricular tachycardia)    Peripheral vascular disease    TIA (transient ischemic attack)    Type 2 diabetes mellitus without complication    Umbilical hernia     Past Surgical History:  Procedure Laterality Date   BILIARY DILATION  01/17/2023   Procedure: BILIARY DILATION;  Surgeon: Gupta, Rajesh, MD;  Location: [facility];  Service: Gastroenterology;;   CATARACT EXTRACTION, BILATERAL Bilateral    COLONOSCOPY     COLONOSCOPY WITH PROPOFOL N/A 06/18/2016   Procedure: COLONOSCOPY WITH PROPOFOL;  Surgeon: Martin U Skulskie, MD;  Location: [facility];  Service: Endoscopy;  Laterality: N/A;   COLONOSCOPY WITH PROPOFOL N/A 01/09/2021   Procedure: COLONOSCOPY WITH PROPOFOL;  Surgeon: Locklear, Cameron T, MD;  Location: [facility];  Service: Endoscopy;  Laterality: N/A;   ENDARTERECTOMY Right 12/19/2018     Procedure: ENDARTERECTOMY CAROTID;  Surgeon: Abbagayle Zaragoza G, MD;  Location: [facility];  Service: Vascular;  Laterality: Right;   ENDOSCOPIC RETROGRADE CHOLANGIOPANCREATOGRAPHY (ERCP) WITH PROPOFOL N/A 01/17/2023   Procedure: ENDOSCOPIC RETROGRADE CHOLANGIOPANCREATOGRAPHY (ERCP) WITH PROPOFOL;  Surgeon: Gupta, Rajesh, MD;  Location: [facility];  Service: Gastroenterology;  Laterality: N/A;   ESOPHAGOGASTRODUODENOSCOPY (EGD) WITH PROPOFOL N/A 06/18/2016   Procedure: ESOPHAGOGASTRODUODENOSCOPY (EGD) WITH PROPOFOL;  Surgeon:  Martin U Skulskie, MD;  Location: [facility];  Service: Endoscopy;  Laterality: N/A;   ESOPHAGOGASTRODUODENOSCOPY (EGD) WITH PROPOFOL N/A 01/09/2021   Procedure: ESOPHAGOGASTRODUODENOSCOPY (EGD) WITH PROPOFOL;  Surgeon: Locklear, Cameron T, MD;  Location: [facility];  Service: Endoscopy;  Laterality: N/A;   LOWER EXTREMITY ANGIOGRAPHY Right 01/25/2023   Procedure: Lower Extremity Angiography;  Surgeon: Latia Mataya G, MD;  Location: [facility];  Service: Cardiovascular;  Laterality: Right;   REMOVAL OF STONES  01/17/2023   Procedure: REMOVAL OF STONES;  Surgeon: Gupta, Rajesh, MD;  Location: [facility];  Service: Gastroenterology;;   ROBOTIC ASSISTED LAPAROSCOPIC CHOLECYSTECTOMY  03/02/2022   SPHINCTEROTOMY  01/17/2023   Procedure: SPHINCTEROTOMY;  Surgeon: Gupta, Rajesh, MD;  Location: [facility];  Service: Gastroenterology;;   TONSILLECTOMY     UMBILICAL HERNIA REPAIR      Social History Social History   Tobacco Use   Smoking status: Former    Types: Cigarettes    Quit date: 02/03/1989    Years since quitting: 34.1   Smokeless tobacco: Never  Vaping Use   Vaping Use: Never used  Substance Use Topics   Alcohol use: Not Currently    Alcohol/week: 6.0 standard drinks of alcohol    Types: 6 Cans of beer per week   Drug use: No    Family History Family History  Problem Relation Age of Onset   Cataracts Mother    Heart attack Mother    Cancer Sister     No Known Allergies   REVIEW OF SYSTEMS (Negative unless checked)  Constitutional: []Weight loss  []Fever  []Chills Cardiac: []Chest pain   []Chest pressure   []Palpitations   []Shortness of breath when laying flat   []Shortness of breath with exertion. Vascular:  [x]Pain in legs with walking   []Pain in legs at rest  []History of DVT   []Phlebitis   []Swelling in legs   []Varicose veins   []Non-healing ulcers Pulmonary:   []Uses home oxygen   []Productive cough   []Hemoptysis   []Wheeze  []COPD    []Asthma Neurologic:  []Dizziness   []Seizures   []History of stroke   []History of TIA  []Aphasia   []Vissual changes   []Weakness or numbness in arm   []Weakness or numbness in leg Musculoskeletal:   []Joint swelling   []Joint pain   []Low back pain Hematologic:  []Easy bruising  []Easy bleeding   []Hypercoagulable state   []Anemic Gastrointestinal:  []Diarrhea   []Vomiting  []Gastroesophageal reflux/heartburn   []Difficulty swallowing. Genitourinary:  []Chronic kidney disease   []Difficult urination  []Frequent urination   []Blood in urine Skin:  []Rashes   []Ulcers  Psychological:  []History of anxiety   [] History of major depression.  Physical Examination  Vitals:   03/20/23 0812  BP: 112/64  Pulse: 65  Resp: 16  Temp: 98.3 F (36.8 C)  TempSrc: Temporal  SpO2: 99%  Weight: 89.4 kg  Height: 5' 8" (1.727 m)   Body mass index is 29.97 kg/m. Gen: WD/WN, NAD Head: [place]/AT, No temporalis wasting.  Ear/Nose/Throat: Hearing grossly   intact, nares w/o erythema or drainage Eyes: PER, EOMI, sclera nonicteric.  Neck: Supple, no masses.  No bruit or JVD.  Pulmonary:  Good air movement, no audible wheezing, no use of accessory muscles.  Cardiac: RRR, normal S1, S2, no Murmurs. Vascular:  mild trophic changes, no open wounds Vessel Right Left  Radial Palpable Palpable  PT Not Palpable Not Palpable  DP Not Palpable Not Palpable  Gastrointestinal: soft, non-distended. No guarding/no peritoneal signs.  Musculoskeletal: M/S 5/5 throughout.  No visible deformity.  Neurologic: CN 2-12 intact. Pain and light touch intact in extremities.  Symmetrical.  Speech is fluent. Motor exam as listed above. Psychiatric: Judgment intact, Mood & affect appropriate for pt's clinical situation. Dermatologic: No rashes or ulcers noted.  No changes consistent with cellulitis.   CBC Lab Results  Component Value Date   WBC 5.2 03/18/2023   HGB 12.4 (L) 03/18/2023   HCT 37.0 (L) 03/18/2023   MCV 104.2  (H) 03/18/2023   PLT 137 (L) 03/18/2023    BMET    Component Value Date/Time   NA 140 03/18/2023 1339   K 3.9 03/18/2023 1339   CL 107 03/18/2023 1339   CO2 24 03/18/2023 1339   GLUCOSE 179 (H) 03/18/2023 1339   BUN 27 (H) 03/18/2023 1339   CREATININE 1.00 03/18/2023 1339   CALCIUM 9.1 03/18/2023 1339   GFRNONAA >60 03/18/2023 1339   GFRAA >60 12/20/2018 0429   Estimated Creatinine Clearance: 68.3 mL/min (by C-G formula based on SCr of 1 mg/dL).  COAG Lab Results  Component Value Date   INR 1.1 01/27/2023   INR 1.6 (H) 01/17/2023   INR 1.7 (H) 01/17/2023    Radiology No results found.   Assessment/Plan 1. Atherosclerosis of native artery of both lower extremities with intermittent claudication (HCC)  Recommend:   The patient has evidence of severe atherosclerotic changes of both lower extremities associated with rest pain of the right and left feet.  This represents a limb threatening ischemia and places the patient at a high risk for limb loss.   Angiography has been performed and the situation is not ideal for intervention.  Given this finding open surgical repair is recommended.    Patient should undergo arterial reconstruction, bilateral femoral endarterectomies with possible SFA stents versus femoral above-knee popliteal bypasses are indicated for the bilateral lower extremity with the hope for limb salvage.  The risks and benefits as well as the alternative therapies was discussed in detail with the patient.  All questions were answered.  Patient agrees to proceed with open vascular surgical reconstruction.   The patient will follow up with me in the office after the procedure.      2. Bilateral carotid artery stenosis Recommend:   Given the patient's asymptomatic subcritical stenosis no further invasive testing or surgery at this time.   Duplex ultrasound shows 1-39% stenosis bilaterally.   Continue antiplatelet therapy as prescribed Continue management of  CAD, HTN and Hyperlipidemia Healthy heart diet,  encouraged exercise at least 4 times per week Follow up in 24 months with duplex ultrasound and physical exam   3. Essential hypertension Continue antihypertensive medications as already ordered, these medications have been reviewed and there are no changes at this time.   4. Controlled other specified diabetes mellitus with hyperglycemia, unspecified whether long term insulin use (HCC) Continue hypoglycemic medications as already ordered, these medications have been reviewed and there are no changes at this time.   Hgb A1C to be monitored as already arranged   by primary service - Ambulatory referral to Cardiology   5. Hyperlipidemia, mixed Continue statin as ordered and reviewed, no changes at this time     Nava Song, MD  03/20/2023 8:22 AM   

## 2023-03-20 NOTE — Anesthesia Postprocedure Evaluation (Signed)
Anesthesia Post Note  Patient: Victor Rojas  Procedure(s) Performed: ENDARTERECTOMY FEMORAL  ( SFA STENT ) (Bilateral) APPLICATION OF CELL SAVER  Patient location during evaluation: PACU Anesthesia Type: General Level of consciousness: awake and alert Pain management: pain level controlled Vital Signs Assessment: post-procedure vital signs reviewed and stable Respiratory status: spontaneous breathing, nonlabored ventilation, respiratory function stable and patient connected to nasal cannula oxygen Cardiovascular status: blood pressure returned to baseline Postop Assessment: no apparent nausea or vomiting Anesthetic complications: no Comments: Transferring to ICU per normal protocol   No notable events documented.   Last Vitals:  Vitals:   03/20/23 1650 03/20/23 1700  BP: 115/67 129/69  Pulse: 77 80  Resp: (!) 9 15  Temp:  (!) 35.6 C  SpO2: 98% 91%    Last Pain:  Vitals:   03/20/23 1700  TempSrc: Axillary  PainSc:                  [name]

## 2023-03-20 NOTE — Interval H&P Note (Signed)
History and Physical Interval Note:  03/20/2023 8:25 AM  Victor Rojas  has presented today for surgery, with the diagnosis of ASO WITH CLAUDICATION.  The various methods of treatment have been discussed with the patient and family. After consideration of risks, benefits and other options for treatment, the patient has consented to  Procedure(s): ENDARTERECTOMY FEMORAL  ( SFA STENT ) (Bilateral) APPLICATION OF CELL SAVER (N/A) as a surgical intervention.  The patient's history has been reviewed, patient examined, no change in status, stable for surgery.  I have reviewed the patient's chart and labs.  Questions were answered to the patient's satisfaction.     Norena Bratton   

## 2023-03-20 NOTE — Interval H&P Note (Signed)
History and Physical Interval Note:  03/20/2023 8:25 AM  Victor Rojas  has presented today for surgery, with the diagnosis of ASO WITH CLAUDICATION.  The various methods of treatment have been discussed with the patient and family. After consideration of risks, benefits and other options for treatment, the patient has consented to  Procedure(s): ENDARTERECTOMY FEMORAL  ( SFA STENT ) (Bilateral) APPLICATION OF CELL SAVER (N/A) as a surgical intervention.  The patient's history has been reviewed, patient examined, no change in status, stable for surgery.  I have reviewed the patient's chart and labs.  Questions were answered to the patient's satisfaction.     [name]

## 2023-03-20 NOTE — Progress Notes (Signed)
Talked patient's RN regarding [facility] PIV access due to infusing vasopressors. Patient's RN stated that she is trying to wean off vasopressors. Informed patient's RN if she can't wean off the vasopressor, put in the IV consult for [facility] PIV access. She understood it well. HS [employer]

## 2023-03-20 NOTE — Op Note (Signed)
OPERATIVE NOTE   PROCEDURE: Bilateral common femoral, superficial femoral and profunda femoris endarterectomy with bovine pericardial patch angioplasty  PRE-OPERATIVE DIAGNOSIS: Atherosclerotic occlusive disease bilateral lower extremities with lifestyle limiting claudication and severe rest pain symptoms; hypertension; diabetes mellitus  POST-OPERATIVE DIAGNOSIS: Same  CO-SURGEON: Renford Dills, MD and Annice Needy, M.D.  ASSISTANT(S): None  ANESTHESIA: general  ESTIMATED BLOOD LOSS: 800 cc  FINDING(S): Profound calcific plaque noted bilaterally extending well past the initial bifurcation of the profunda femoris arteries to the fourth or fifth order branches with eversion endarterectomy of the SFA.  SPECIMEN(S):  Calcific plaque from the common femoral, superficial femoral and the profunda femoris arteries bilaterally  INDICATIONS:   Victor Rojas 77 y.o. y.o.male who presents with complaints of lifestyle limiting claudication and severe pain continuously in the feet bilaterally. The patient has documented severe atherosclerotic occlusive disease and has undergone multiple minimally invasive treatments in the past. However, at this point his primary area of stricture stenosis resides in the common femoral and origins of the superficial femoral and profunda femoris extending into these arteries and therefore this is not amenable to intervention and he is now undergoing open endarterectomy. The risks and benefits of been reviewed with the patient, all questions have answered; alternative therapies have been reviewed as well and the patient has agreed to proceed with surgical open repair.  DESCRIPTION: After obtaining full informed written consent, the patient was brought back to the operating room and placed supine upon the operating table.  The patient received IV antibiotics prior to induction.  After obtaining adequate anesthesia, the patient was prepped and draped in the  standard fashion for: bilateral femoral exposure.  Co-surgeons are required because this is a bilateral procedure with work being performed simultaneously from both the right femoral and left femoral approach.  This also expedite the procedure making a shorter operative time reducing complications and improving patient safety.  Attention was turned to the bilateral groin with Dr. Wyn Quaker working on the right and myself working on the left of the patient.  Vertical  incisions were made over the common femoral artery and dissected down to the common femoral artery with electrocautery.  I dissected out the common femoral artery from the distal external iliac artery (identified by the superficial circumflex vessels) down to the femoral bifurcation.  On initial inspection, the common femoral artery was: Densely calcified and there was no palpable pulse noted bilaterally.    Subsequently the dissection was continued to include all circumflex branches and the profunda femoral artery and superficial femoral artery. The superficial femoral artery was dissected circumferentially for a distance of approximately 3-4 cm and the profunda femoris was dissected circumferentially out to the fourth or fifth order branches individual vessel loops were placed around each branch (this necessitated ligating and dividing the profunda femoris vein). Both of the groins were treated simultaneously as described above. Control of all branches was obtained with vessel loops.  A softer area in the distal external iliac artery amendable to clamping was identified.    The patient was given 8000 units of Heparin intravenously, which was a therapeutic bolus.   After waiting 3 minutes, the left distal external iliac artery was clamped and placed all circumflex branches, and the profunda and superficial femoral arteries under tension.  Arteriotomy was made in the left common femoral artery with a 11-blade and extended it with a Potts scissor  proximally and distally extending the distal end down the left profunda femoris for  approximately 5 cm.   Endarterectomy was then performed under direct visualization using a freer elevator and a right angle from the mid common femoral extending up both proximally and distally. Proximally the endarterectomy was brought up to the level of the clamp where a clean edge was obtained. Distally the endarterectomy was carried down to a spot in the profunda femoris where a feathered edge was obtained.  7-0 Prolene interrupted tacking sutures were placed to secure the leading edge of the plaque in the distal profunda.  The SFA was treated with an eversion technique extending endarterectomy approximately 2 cm distally.   At this point, we fashioned a bovine pericardial patch for the geometry of the arteriotomy.  The patch was sewn to the artery with 2 running stitches of 6-0 Prolene, running from each end.  Prior to completing the patch angioplasty, the profunda femoral artery was flushed as was the superficial femoral artery. The system was then forward flushed. The endarterectomy site was then irrigated copiously with heparinized saline. The patch angioplasty was completed in the usual fashion.  Flow was then reestablished first to the profunda femoris and then the superficial femoral artery. Any gaps or bleeding sites in the suture line were easily controlled with a 6-0 Prolene suture.   The right femoral artery was then addressed.  The right distal external iliac artery was clamped and placed all circumflex branches, and the profunda and superficial femoral arteries under tension.  Arteriotomy was made in the right common femoral artery with a 11-blade and extended it with a Potts scissor proximally and distally extending the distal end down the right profunda femoris for approximately 5 cm.   Endarterectomy was then performed under direct visualization using a freer elevator and a right angle from the mid  common femoral extending up both proximally and distally. Proximally the endarterectomy was brought up to the level of the clamp where a clean edge was obtained. Distally the endarterectomy was carried down to a spot in the right profunda femoris where a feathered edge was obtained.  7-0 Prolene interrupted tacking sutures were placed to secure the leading edge of the plaque in the distal profunda.  The right SFA was treated with an eversion technique extending endarterectomy approximately 2 cm distally.   At this point, we fashioned a bovine pericardial patch for the geometry of the arteriotomy.  The patch was sewn to the artery with 2 running stitches of 6-0 Prolene, running from each end.  Prior to completing the patch angioplasty, the profunda femoral artery was flushed as was the superficial femoral artery. The system was then forward flushed. The endarterectomy site was then irrigated copiously with heparinized saline. The patch angioplasty was completed in the usual fashion.  Flow was then reestablished first to the profunda femoris and then the superficial femoral artery. Any gaps or bleeding sites in the suture line were easily controlled with a 6-0 Prolene suture.   Both right and left groins were then irrigated copiously with sterile saline and subsequently Evicel and Surgicel were placed in the wound. The incision was repaired with a double layer of 2-0 Vicryl, a double layer of 3-0 Vicryl, and surgical staples were used to close skin.  Prevena disposable vacs were applied to both incisions.   COMPLICATIONS: None  CONDITION: Victor Rojas, M.D. [facility] Office: [phone]  03/20/2023, 3:44 PM

## 2023-03-20 NOTE — Op Note (Signed)
OPERATIVE NOTE   PROCEDURE: 1.   Left common femoral, profunda femoris, and superficial femoral artery endarterectomies 2.   Right common femoral, profunda femoris, and superficial femoral artery endarterectomies   PRE-OPERATIVE DIAGNOSIS: 1.Atherosclerotic occlusive disease bilateral lower extremities with rest pain   POST-OPERATIVE DIAGNOSIS: Same  SURGEON: Festus Barren, MD  CO-surgeon:  [name], MD  ANESTHESIA:  general  ESTIMATED BLOOD LOSS: 800 cc  FINDING(S): 1.  significant plaque in bilateral common femoral, profunda femoris, and superficial femoral arteries  SPECIMEN(S):  Bilateral common femoral, profunda femoris, and superficial femoral artery plaque.  INDICATIONS:    Patient presents with rest pain of both lower extremitites.  Bilateral femoral endarterectomies are planned to try to improve perfusion.  The risks and benefits as well as alternative therapies including intervention were reviewed in detail all questions were answered the patient agrees to proceed with surgery.  DESCRIPTION: After obtaining full informed written consent, the patient was brought back to the operating room and placed supine upon the operating table.  The patient received IV antibiotics prior to induction.  After obtaining adequate anesthesia, the patient was prepped and draped in the standard fashion appropriate time out is called.    With myself working on the right and Dr. Gilda Crease working on the left we began by dissecting out the femoral arteries on each side. Vertical incisions were created overlying both femoral arteries. The common femoral artery proximally, and superficial femoral artery, and primary profunda femoris artery branches were encircled with vessel loops and prepared for control. Both femoral arteries were found to have significant plaque from the common femoral artery into the profunda and superficial femoral arteries.  The dissection was carried well into the third order  branches of the profunda femoris artery due to the heavily diseased profunda femoris artery on each side, this being the primary outflow for endarterectomy and flow distally.   8000 units of heparin was given and allowed circulate for 5 minutes.  Additional heparin was given later in the procedure as needed  Attention is then turned to the left femoral artery.  An arteriotomy is made with 11 blade and extended with Potts scissors in the common femoral artery and carried down onto the first 4 cm of the profunda femoris artery artery. An endarterectomy was then performed. The [facility] was used to create a plane. The proximal endpoint was cut flush with tenotomy scissors. This was in the proximal common femoral artery. An eversion endarterectomy was then performed for the first 2-3 cm of the superficial femoral artery.  The profunda femoris artery endarterectomy was quite extensive and as long as the common femoral endarterectomy due to the heavily diseased nature of this vessel.  This was taken out beyond the primary branches of the profunda femoris artery out to a distal bifurcation.  The distal endpoint of the profunda femoris artery endarterectomy was created with gentle traction and the distal endpoint was tacked down with a pair of 7-0 Prolene sutures.  The bovine pericardial patcth is then selected and prepared for a patch angioplasty.  It is cut and beveled and started at the proximal endpoint with a 6-0 Prolene suture.  Approximately one half of the suture line is run medially and laterally and the distal end point was cut and bevelled to match the arteriotomy.  A second 6-0 Prolene was started at the distal end point and run to the mid portion to complete the arteriotomy.  The vessel was flushed prior to release of control and  completion of the anastomosis.  At this point, flow was established first to the profunda femoris artery and then to the superficial femoral artery. Easily palpable pulses  are noted well beyond the anastomosis.  6-0 Prolene patch sutures, some with the use of pledgets were used for hemostasis.  The right femoral artery is then addressed. Arteriotomy is made in the common femoral artery and extended down into the right profunda femoris artery beyond the primary branch and out beyond the primary bifurcation of the profunda femoris artery due to the heavily diseased nature of this vessel. Similarly, an endarterectomy was performed with the [facility]. The proximal endpoint was created with hemostats and gentle traction in the proximal common femoral artery.  The origin of the superficial femoral artery was addressed and treated with an eversion endarterectomy and this was performed with a hemostat and gentle traction. The arteriotomy was carried down onto the profunda femoris artery and the endarterectomy was continued to this point.  Again, due to the heavily diseased nature of this vessel this was an extensive endarterectomy that was as long as not longer than the common femoral artery endarterectomy.  The distal endpoint was created with gentle traction and was fairly clean.  Care had to be taken to provide flow to the primary bifurcation of the main profunda femoris artery as our endarterectomy went to and just beyond this point well out the profunda femoris artery. The bovine pericardial patch was then brought onto the field.  It is cut and beveled and started at the proximal endpoint with a 6-0 Prolene suture.  Approximately one half of the suture line is run medially and laterally and the distal end point was cut and bevelled to match the arteriotomy.  A second 6-0 Prolene was started at the distal end point and run to the mid portion to complete the arteriotomy.   Flushing maneuvers were performed and flow was reestablished to the femoral vessels. Excellent pulses noted below the femoral anastomosis.  Vistacel and Fibrillar topical hemostatic agents were placed in the  femoral incisions and hemostasis was complete. The femoral incisions were then closed in a layered fashion with 2 layers of 2-0 Vicryl, 2 layers of 3-0 Vicryl, and staple with an incisional VAC for the skin closure.  The patient was then awakened from anesthesia and taken to the recovery room in stable condition having tolerated the procedure well.  COMPLICATIONS: None  CONDITION: Stable     Festus Barren 03/20/2023 3:35 PM  This note was created with Dragon Medical transcription system. Any errors in dictation are purely unintentional.

## 2023-03-20 NOTE — Progress Notes (Signed)
MRN : [id number]  Victor Rojas is a 77 y.o. (1946-03-29) male who presents with chief complaint of check circulation.  History of Present Illness:   The patient returns to the office for followup and review status post angiogram with intervention on 01/25/2023.    Procedure:  Percutaneous transluminal angioplasty and stent placement right common iliac artery; "kissing balloon" technique Percutaneous transluminal and plasty and stent placement left common iliac artery; "kissing balloon" technique   The patient notes no improvement in the lower extremity symptoms. He continues to have extremely short distance claudication and some rest pain symptoms. No new ulcers or wounds have occurred since the last visit.   There have been no significant changes to the patient's overall health care.   No documented history of amaurosis fugax or recent TIA symptoms. There are no recent neurological changes noted. No documented history of DVT, PE or superficial thrombophlebitis. The patient denies recent episodes of angina or shortness of breath.   Current Meds  Medication Sig   amLODipine (NORVASC) 5 MG tablet Take 1 tablet (5 mg total) by mouth daily.   aspirin EC 81 MG tablet Take 81 mg by mouth daily.    atorvastatin (LIPITOR) 40 MG tablet Take 1 tablet (40 mg total) by mouth daily.   Cholecalciferol (D3-1000) 25 MCG (1000 UT) tablet Take 1,000 Units by mouth daily.   cyanocobalamin (VITAMIN B12) 1000 MCG tablet Take 1,000 mcg by mouth daily.   indomethacin (INDOCIN) 25 MG capsule Take 25 mg by mouth 2 (two) times daily as needed.   lisinopril-hydrochlorothiazide (ZESTORETIC) 20-12.5 MG tablet Take 1 tablet by mouth 2 (two) times daily. (Patient taking differently: Take 1 tablet by mouth daily.)   metFORMIN (GLUCOPHAGE) 500 MG tablet Take 500 mg by mouth daily with breakfast.   metoprolol tartrate (LOPRESSOR) 25 MG tablet Take 25 mg by mouth daily.   Multiple Vitamin  (MULTI-VITAMINS) TABS Take 1 tablet by mouth daily.    pantoprazole (PROTONIX) 40 MG tablet Take 40 mg by mouth at bedtime.   vitamin C (ASCORBIC ACID) 500 MG tablet Take 500 mg by mouth daily.   vitamin E 180 MG (400 UNITS) capsule Take 400 Units by mouth daily.    Past Medical History:  Diagnosis Date   Adenoma of colon    Arthritis    Bilateral carotid artery stenosis    Diverticulosis    GERD (gastroesophageal reflux disease)    Hiatal hernia    History of gout    History of kidney stones    Hyperlipemia    Hypertension    Paroxysmal SVT (supraventricular tachycardia)    Peripheral vascular disease    TIA (transient ischemic attack)    Type 2 diabetes mellitus without complication    Umbilical hernia     Past Surgical History:  Procedure Laterality Date   BILIARY DILATION  01/17/2023   Procedure: BILIARY DILATION;  Surgeon: Lynann Bologna, MD;  Location: [facility] ENDOSCOPY;  Service: Gastroenterology;;   CATARACT EXTRACTION, BILATERAL Bilateral    COLONOSCOPY     COLONOSCOPY WITH PROPOFOL N/A 06/18/2016   Procedure: COLONOSCOPY WITH PROPOFOL;  Surgeon: Christena Deem, MD;  Location: [facility] ENDOSCOPY;  Service: Endoscopy;  Laterality: N/A;   COLONOSCOPY WITH PROPOFOL N/A 01/09/2021   Procedure: COLONOSCOPY WITH PROPOFOL;  Surgeon: Regis Bill, MD;  Location: [facility] ENDOSCOPY;  Service: Endoscopy;  Laterality: N/A;   ENDARTERECTOMY Right 12/19/2018  Procedure: ENDARTERECTOMY CAROTID;  Surgeon: Renford Dills, MD;  Location: [facility] ORS;  Service: Vascular;  Laterality: Right;   ENDOSCOPIC RETROGRADE CHOLANGIOPANCREATOGRAPHY (ERCP) WITH PROPOFOL N/A 01/17/2023   Procedure: ENDOSCOPIC RETROGRADE CHOLANGIOPANCREATOGRAPHY (ERCP) WITH PROPOFOL;  Surgeon: Lynann Bologna, MD;  Location: [facility] ENDOSCOPY;  Service: Gastroenterology;  Laterality: N/A;   ESOPHAGOGASTRODUODENOSCOPY (EGD) WITH PROPOFOL N/A 06/18/2016   Procedure: ESOPHAGOGASTRODUODENOSCOPY (EGD) WITH PROPOFOL;  Surgeon:  Christena Deem, MD;  Location: [facility] ENDOSCOPY;  Service: Endoscopy;  Laterality: N/A;   ESOPHAGOGASTRODUODENOSCOPY (EGD) WITH PROPOFOL N/A 01/09/2021   Procedure: ESOPHAGOGASTRODUODENOSCOPY (EGD) WITH PROPOFOL;  Surgeon: Regis Bill, MD;  Location: [facility] ENDOSCOPY;  Service: Endoscopy;  Laterality: N/A;   LOWER EXTREMITY ANGIOGRAPHY Right 01/25/2023   Procedure: Lower Extremity Angiography;  Surgeon: Renford Dills, MD;  Location: [facility] INVASIVE CV LAB;  Service: Cardiovascular;  Laterality: Right;   REMOVAL OF STONES  01/17/2023   Procedure: REMOVAL OF STONES;  Surgeon: Lynann Bologna, MD;  Location: [facility] ENDOSCOPY;  Service: Gastroenterology;;   ROBOTIC ASSISTED LAPAROSCOPIC CHOLECYSTECTOMY  03/02/2022   SPHINCTEROTOMY  01/17/2023   Procedure: Dennison Mascot;  Surgeon: Lynann Bologna, MD;  Location: [facility] ENDOSCOPY;  Service: Gastroenterology;;   TONSILLECTOMY     UMBILICAL HERNIA REPAIR      Social History Social History   Tobacco Use   Smoking status: Former    Types: Cigarettes    Quit date: 02/03/1989    Years since quitting: 34.1   Smokeless tobacco: Never  Vaping Use   Vaping Use: Never used  Substance Use Topics   Alcohol use: Not Currently    Alcohol/week: 6.0 standard drinks of alcohol    Types: 6 Cans of beer per week   Drug use: No    Family History Family History  Problem Relation Age of Onset   Cataracts Mother    Heart attack Mother    Cancer Sister     No Known Allergies   REVIEW OF SYSTEMS (Negative unless checked)  Constitutional: [] Weight loss  [] Fever  [] Chills Cardiac: [] Chest pain   [] Chest pressure   [] Palpitations   [] Shortness of breath when laying flat   [] Shortness of breath with exertion. Vascular:  [x] Pain in legs with walking   [] Pain in legs at rest  [] History of DVT   [] Phlebitis   [] Swelling in legs   [] Varicose veins   [] Non-healing ulcers Pulmonary:   [] Uses home oxygen   [] Productive cough   [] Hemoptysis   [] Wheeze  [] COPD    [] Asthma Neurologic:  [] Dizziness   [] Seizures   [] History of stroke   [] History of TIA  [] Aphasia   [] Vissual changes   [] Weakness or numbness in arm   [] Weakness or numbness in leg Musculoskeletal:   [] Joint swelling   [] Joint pain   [] Low back pain Hematologic:  [] Easy bruising  [] Easy bleeding   [] Hypercoagulable state   [] Anemic Gastrointestinal:  [] Diarrhea   [] Vomiting  [] Gastroesophageal reflux/heartburn   [] Difficulty swallowing. Genitourinary:  [] Chronic kidney disease   [] Difficult urination  [] Frequent urination   [] Blood in urine Skin:  [] Rashes   [] Ulcers  Psychological:  [] History of anxiety   []  History of major depression.  Physical Examination  Vitals:   03/20/23 0812  BP: 112/64  Pulse: 65  Resp: 16  Temp: 98.3 F (36.8 C)  TempSrc: Temporal  SpO2: 99%  Weight: 89.4 kg  Height: 5\' 8"  (1.727 m)   Body mass index is 29.97 kg/m. Gen: WD/WN, NAD Head: [place]/AT, No temporalis wasting.  Ear/Nose/Throat: Hearing grossly  intact, nares w/o erythema or drainage Eyes: PER, EOMI, sclera nonicteric.  Neck: Supple, no masses.  No bruit or JVD.  Pulmonary:  Good air movement, no audible wheezing, no use of accessory muscles.  Cardiac: RRR, normal S1, S2, no Murmurs. Vascular:  mild trophic changes, no open wounds Vessel Right Left  Radial Palpable Palpable  PT Not Palpable Not Palpable  DP Not Palpable Not Palpable  Gastrointestinal: soft, non-distended. No guarding/no peritoneal signs.  Musculoskeletal: M/S 5/5 throughout.  No visible deformity.  Neurologic: CN 2-12 intact. Pain and light touch intact in extremities.  Symmetrical.  Speech is fluent. Motor exam as listed above. Psychiatric: Judgment intact, Mood & affect appropriate for pt's clinical situation. Dermatologic: No rashes or ulcers noted.  No changes consistent with cellulitis.   CBC Lab Results  Component Value Date   WBC 5.2 03/18/2023   HGB 12.4 (L) 03/18/2023   HCT 37.0 (L) 03/18/2023   MCV 104.2  (H) 03/18/2023   PLT 137 (L) 03/18/2023    BMET    Component Value Date/Time   NA 140 03/18/2023 1339   K 3.9 03/18/2023 1339   CL 107 03/18/2023 1339   CO2 24 03/18/2023 1339   GLUCOSE 179 (H) 03/18/2023 1339   BUN 27 (H) 03/18/2023 1339   CREATININE 1.00 03/18/2023 1339   CALCIUM 9.1 03/18/2023 1339   GFRNONAA >60 03/18/2023 1339   GFRAA >60 12/20/2018 0429   Estimated Creatinine Clearance: 68.3 mL/min (by C-G formula based on SCr of 1 mg/dL).  COAG Lab Results  Component Value Date   INR 1.1 01/27/2023   INR 1.6 (H) 01/17/2023   INR 1.7 (H) 01/17/2023    Radiology No results found.   Assessment/Plan 1. Atherosclerosis of native artery of both lower extremities with intermittent claudication (HCC)  Recommend:   The patient has evidence of severe atherosclerotic changes of both lower extremities associated with rest pain of the right and left feet.  This represents a limb threatening ischemia and places the patient at a high risk for limb loss.   Angiography has been performed and the situation is not ideal for intervention.  Given this finding open surgical repair is recommended.    Patient should undergo arterial reconstruction, bilateral femoral endarterectomies with possible SFA stents versus femoral above-knee popliteal bypasses are indicated for the bilateral lower extremity with the hope for limb salvage.  The risks and benefits as well as the alternative therapies was discussed in detail with the patient.  All questions were answered.  Patient agrees to proceed with open vascular surgical reconstruction.   The patient will follow up with me in the office after the procedure.      2. Bilateral carotid artery stenosis Recommend:   Given the patient's asymptomatic subcritical stenosis no further invasive testing or surgery at this time.   Duplex ultrasound shows 1-39% stenosis bilaterally.   Continue antiplatelet therapy as prescribed Continue management of  CAD, HTN and Hyperlipidemia Healthy heart diet,  encouraged exercise at least 4 times per week Follow up in 24 months with duplex ultrasound and physical exam   3. Essential hypertension Continue antihypertensive medications as already ordered, these medications have been reviewed and there are no changes at this time.   4. Controlled other specified diabetes mellitus with hyperglycemia, unspecified whether long term insulin use (HCC) Continue hypoglycemic medications as already ordered, these medications have been reviewed and there are no changes at this time.   Hgb A1C to be monitored as already arranged  by primary service - Ambulatory referral to Cardiology   5. Hyperlipidemia, mixed Continue statin as ordered and reviewed, no changes at this time     Levora DredgeGregory Glyn Gerads, MD  03/20/2023 8:22 AM

## 2023-03-20 NOTE — Transfer of Care (Signed)
Immediate Anesthesia Transfer of Care Note  Patient: Victor Rojas  Procedure(s) Performed: ENDARTERECTOMY FEMORAL  ( SFA STENT ) (Bilateral) APPLICATION OF CELL SAVER  Patient Location: PACU  Anesthesia Type:General  Level of Consciousness: awake, drowsy, and patient cooperative  Airway & Oxygen Therapy: Patient Spontanous Breathing and Patient connected to face mask oxygen  Post-op Assessment: Report given to RN and Post -op Vital signs reviewed and stable  Post vital signs: Reviewed and stable  Last Vitals:  Vitals Value Taken Time  BP 97/84 03/20/23 1605  Temp 36.2 C 03/20/23 1556  Pulse 84 03/20/23 1606  Resp 16 03/20/23 1606  SpO2 100 % 03/20/23 1606  Vitals shown include unvalidated device data.  Last Pain:  Vitals:   03/20/23 1556  TempSrc:   PainSc: 0-No pain      Patients Stated Pain Goal: 0 (03/20/23 6270)  Complications: No notable events documented.

## 2023-03-21 ENCOUNTER — Encounter: Payer: Self-pay | Admitting: Vascular Surgery

## 2023-03-21 LAB — BASIC METABOLIC PANEL
Anion gap: 8 (ref 5–15)
BUN: 29 mg/dL — ABNORMAL HIGH (ref 8–23)
CO2: 21 mmol/L — ABNORMAL LOW (ref 22–32)
Calcium: 7.7 mg/dL — ABNORMAL LOW (ref 8.9–10.3)
Chloride: 110 mmol/L (ref 98–111)
Creatinine, Ser: 1.11 mg/dL (ref 0.61–1.24)
GFR, Estimated: >60 mL/min (ref >60)
Glucose, Bld: 182 mg/dL — ABNORMAL HIGH (ref 70–99)
Potassium: 5 mmol/L (ref 3.5–5.1)
Sodium: 139 mmol/L (ref 135–145)

## 2023-03-21 LAB — GLUCOSE, CAPILLARY
Glucose-Capillary: 153 mg/dL — ABNORMAL HIGH (ref 70–99)
Glucose-Capillary: 205 mg/dL — ABNORMAL HIGH (ref 70–99)
Glucose-Capillary: 148 mg/dL — ABNORMAL HIGH (ref 70–99)
Glucose-Capillary: 129 mg/dL — ABNORMAL HIGH (ref 70–99)
Glucose-Capillary: 136 mg/dL — ABNORMAL HIGH (ref 70–99)

## 2023-03-21 LAB — CBC
HCT: 29.5 % — ABNORMAL LOW (ref 39.0–52.0)
Hemoglobin: 9.7 g/dL — ABNORMAL LOW (ref 13.0–17.0)
MCH: 35.3 pg — ABNORMAL HIGH (ref 26.0–34.0)
MCHC: 32.9 g/dL (ref 30.0–36.0)
MCV: 107.3 fL — ABNORMAL HIGH (ref 80.0–100.0)
Platelets: 120 10*3/uL — ABNORMAL LOW (ref 150–400)
RBC: 2.75 MIL/uL — ABNORMAL LOW (ref 4.22–5.81)
RDW: 14.7 % (ref 11.5–15.5)
WBC: 10.4 10*3/uL (ref 4.0–10.5)
nRBC: 0 % (ref 0.0–0.2)

## 2023-03-21 NOTE — Progress Notes (Signed)
  Progress Note    03/21/2023 7:29 AM 1 Day Post-Op  Subjective: Victor Rojas is a 77 year old male now status postop day 1 from bilateral femoral, profunda femoris, and superficial femoral artery endarterectomies. Patient is resting comfortably in bed in ICU this morning.  No complaints overnight.  Vitals all remained stable.  Patient is on no IV therapy at this time.  Patient has bilateral groin incisions with Prevena vacs in place.  They are working properly.  Patient is a bilateral lower extremities are warm to touch.  Patient's bilateral PT and PD pulses are strong via Doppler.  Patient's lower extremities are neuro intact.  He denies any numbness or tingling this morning.  Patient has Foley catheter in place.   Vitals:   03/21/23 0530 03/21/23 0645  BP: (!) 118/102 116/64  Pulse: 81 76  Resp: 16 19  Temp:    SpO2: 100% 93%   Physical Exam: Cardiac:  RRR Lungs: Normal respiratory effort, clear on auscultation throughout. Incisions: Bilateral groin incisions clean dry and intact with Prevena vacs in place. Extremities: Bilateral lower extremities warm to touch Doppler PT and PT pulses are strong. Abdomen: Positive bowel sounds all 4 quadrants, soft, nontender, nondistended. Neurologic: Alert and oriented x 4, follows all commands appropriately, answers all questions appropriately.  CBC    Component Value Date/Time   WBC 10.4 03/21/2023 0018   RBC 2.75 (L) 03/21/2023 0018   HGB 9.7 (L) 03/21/2023 0018   HCT 29.5 (L) 03/21/2023 0018   PLT 120 (L) 03/21/2023 0018   MCV 107.3 (H) 03/21/2023 0018   MCH 35.3 (H) 03/21/2023 0018   MCHC 32.9 03/21/2023 0018   RDW 14.7 03/21/2023 0018   LYMPHSABS 1.0 03/18/2023 1339   MONOABS 0.4 03/18/2023 1339   EOSABS 0.1 03/18/2023 1339   BASOSABS 0.0 03/18/2023 1339    BMET    Component Value Date/Time   NA 139 03/21/2023 0018   K 5.0 03/21/2023 0018   CL 110 03/21/2023 0018   CO2 21 (L) 03/21/2023 0018   GLUCOSE 182 (H)  03/21/2023 0018   BUN 29 (H) 03/21/2023 0018   CREATININE 1.11 03/21/2023 0018   CALCIUM 7.7 (L) 03/21/2023 0018   GFRNONAA >60 03/21/2023 0018   GFRAA >60 12/20/2018 0429    INR    Component Value Date/Time   INR 1.1 01/27/2023 1126     Intake/Output Summary (Last 24 hours) at 03/21/2023 0729 Last data filed at 03/21/2023 0600 Gross per 24 hour  Intake 8456.04 ml  Output 2730 ml  Net 5726.04 ml     Assessment/Plan:  77 y.o. male is s/p bilateral common femoral, profunda femoris, and superficial femoral artery endarterectomies.  1 Day Post-Op   PLAN: Remove Foley catheter this morning. Advance to regular diet. Ambulate with assistance this morning. Transfer to the floor. Anticoagulation to continue ASA 81 mg daily and Plavix 75 mg daily.  DVT prophylaxis: ASA 81 mg daily, Plavix 75 mg daily.   Marcie Bal [facility] 03/21/2023 7:29 AM

## 2023-03-21 NOTE — Progress Notes (Signed)
PT was assisted from bed to stand with front wheeled walker to aide. PT began having 10/10 pain in left groin travelling down entire leg. PT was unable to walk but was able to bear weight on both legs. PT returned to bed. Pain eased quickly once back supine. Pain quickly down to 04/10. Re assessed sites with no new swelling or evolved ecchymosis.      03/21/23 1400  Mobility  Activity Stood at bedside  Range of Motion/Exercises Active  Level of Designer, industrial/product wheel walker  Distance Ambulated (ft) 0 ft  Activity Response Tolerated poorly

## 2023-03-22 LAB — GLUCOSE, CAPILLARY
Glucose-Capillary: 116 mg/dL — ABNORMAL HIGH (ref 70–99)
Glucose-Capillary: 125 mg/dL — ABNORMAL HIGH (ref 70–99)
Glucose-Capillary: 150 mg/dL — ABNORMAL HIGH (ref 70–99)

## 2023-03-22 LAB — SURGICAL PATHOLOGY

## 2023-03-22 MED ORDER — PANTOPRAZOLE SODIUM 40 MG PO TBEC
40.0000 mg | DELAYED_RELEASE_TABLET | Freq: Every day | ORAL | Status: DC
  Administered 2023-03-22 – 2023-03-23 (×2): 40 mg via ORAL
  Filled 2023-03-22 (×2): qty 1

## 2023-03-22 NOTE — TOC CM/SW Note (Signed)
  Transition of Care [facility]) Screening Note   Patient Details  Name: Victor Rojas Date of Birth: Oct 30, 1946   Transition of Care [facility]) CM/SW Contact:    Margarito Liner, LCSW Phone Number: 03/22/2023, 2:37 PM    Transition of Care Department [facility]) has reviewed patient and no TOC needs have been identified at this time. We will continue to monitor patient advancement through interdisciplinary progression rounds. If new patient transition needs arise, please place a TOC consult.

## 2023-03-22 NOTE — Progress Notes (Addendum)
  Progress Note    03/22/2023 9:21 AM 2 Days Post-Op  Subjective:  Victor Rojas is a 77 year old male now status postop day 1 from bilateral femoral, profunda femoris, and superficial femoral artery endarterectomies. Patient is resting comfortably in bedside chair in ICU this morning.  No complaints overnight.  Vitals all remained stable.  Patient is taking PO well.  Patient has bilateral groin incisions with Prevena vacs in place.  Patient states he is still having considerably pain and soreness in his groins. They are working properly.  Patient is a bilateral lower extremities are warm to touch.  Patient's bilateral PT and PD pulses are strong via Doppler.  Patient's lower extremities are neuro intact.  He denies any numbness or tingling this morning.    Vitals:   03/22/23 0700 03/22/23 0800  BP: (!) 111/53 121/60  Pulse: 73 77  Resp: 14 11  Temp:  98 F (36.7 C)  SpO2: 92% 95%   Physical Exam: Cardiac:  RRR  Lungs:  Normal respiratory effort, clear on auscultation throughout.  Incisions:  Bilateral groin incisions clean dry and intact with Prevena vacs in place.  Extremities:  Bilateral lower extremities warm to touch Doppler PT and PT pulses are strong.  Abdomen:  Positive bowel sounds all 4 quadrants, soft, nontender, nondistended.  Neurologic: Alert and oriented x 4, follows all commands appropriately, answers all questions appropriately.    CBC    Component Value Date/Time   WBC 10.4 03/21/2023 0018   RBC 2.75 (L) 03/21/2023 0018   HGB 9.7 (L) 03/21/2023 0018   HCT 29.5 (L) 03/21/2023 0018   PLT 120 (L) 03/21/2023 0018   MCV 107.3 (H) 03/21/2023 0018   MCH 35.3 (H) 03/21/2023 0018   MCHC 32.9 03/21/2023 0018   RDW 14.7 03/21/2023 0018   LYMPHSABS 1.0 03/18/2023 1339   MONOABS 0.4 03/18/2023 1339   EOSABS 0.1 03/18/2023 1339   BASOSABS 0.0 03/18/2023 1339    BMET    Component Value Date/Time   NA 139 03/21/2023 0018   K 5.0 03/21/2023 0018   CL 110  03/21/2023 0018   CO2 21 (L) 03/21/2023 0018   GLUCOSE 182 (H) 03/21/2023 0018   BUN 29 (H) 03/21/2023 0018   CREATININE 1.11 03/21/2023 0018   CALCIUM 7.7 (L) 03/21/2023 0018   GFRNONAA >60 03/21/2023 0018   GFRAA >60 12/20/2018 0429    INR    Component Value Date/Time   INR 1.1 01/27/2023 1126     Intake/Output Summary (Last 24 hours) at 03/22/2023 0921 Last data filed at 03/22/2023 0855 Gross per 24 hour  Intake 814.3 ml  Output 1775 ml  Net -960.7 ml     Assessment/Plan:  77 y.o. male is s/p bilateral common femoral, profunda femoris, and superficial femoral artery endarterectomies.  2 Days Post-Op   PLAN: Transfer to the floor. Reassess for discharge later today.  Anticoagulation to continue ASA 81 mg daily and Plavix 75 mg daily.   DVT prophylaxis:  ASA 81 mg daily, Plavix 75 mg daily.    Marcie Bal [facility] 03/22/2023 9:21 AM

## 2023-03-22 NOTE — Progress Notes (Signed)
Report given to charge nurse on receiving unit, patient is being transported in hospital bed, cardiac monitoring discontinued, patient has all belongings, and in stable condition.

## 2023-03-22 NOTE — Plan of Care (Signed)
Continuing with plan of care. 

## 2023-03-22 NOTE — Progress Notes (Addendum)
Reassessment Note:  I had a long detailed discussion this afternoon with Victor Rojas, regarding going home today. He states he lives alone and needs the use of his walker just to ambulate around at home. Today he endorses increased soreness and pain in both bilateral groins at the incision sites. He also endorses not ambulating in the halls after being transferred to the floor. The nursing staff states the patient refused doing anything today. He is also very concerned with having to care for the Prevena Vacs at home. He feels they impede his ability to ambulate.   After a long detailed conversation he is willing to go home tomorrow after working with physical therapy if his pain and soreness are better. I agreed to the plan if he participates with PT this evening and tomorrow. He has been instructed that he will have some degree of soreness and pain for weeks post operatively but it will get better. We can send him home with some pain medication. I asked that he have a friend come visit him to check on him daily for any special needs.

## 2023-03-23 LAB — GLUCOSE, CAPILLARY: Glucose-Capillary: 151 mg/dL — ABNORMAL HIGH (ref 70–99)

## 2023-03-23 MED ORDER — OXYCODONE-ACETAMINOPHEN 5-325 MG PO TABS: 1.0000 | ORAL_TABLET | ORAL | 0 refills | Status: AC | PRN

## 2023-03-23 NOTE — Discharge Summary (Signed)
[facility] Discharge Summary   Patient ID:  Victor Rojas MRN: [id number] DOB/AGE: 77/26/1947 77 y.o.  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]  Secondary Diagnoses: Past Medical History:  Diagnosis Date   Adenoma of colon    Arthritis    Bilateral carotid artery stenosis    Diverticulosis    GERD (gastroesophageal reflux disease)    Hiatal hernia    History of gout    History of kidney stones    Hyperlipemia    Hypertension    Paroxysmal SVT (supraventricular tachycardia)    Peripheral vascular disease    TIA (transient ischemic attack)    Type 2 diabetes mellitus without complication    Umbilical hernia     Procedures: 03/20/2023 Procedure(s): ENDARTERECTOMY FEMORAL  ( SFA STENT ) APPLICATION OF CELL SAVER  Discharged Condition: good  HPI: The patient returns to the office for followup and review status post angiogram with intervention on 01/25/2023.    Procedure:  Percutaneous transluminal angioplasty and stent placement right common iliac artery; "kissing balloon" technique Percutaneous transluminal and plasty and stent placement left common iliac artery; "kissing balloon" technique   The patient notes no improvement in the lower extremity symptoms. He continues to have extremely short distance claudication and some rest pain symptoms. No new ulcers or wounds have occurred since the last visit.   There have been no significant changes to the patient's overall health care.   No documented history of amaurosis fugax or recent TIA symptoms. There are no recent neurological changes noted. No documented history of DVT, PE or superficial thrombophlebitis. The patient denies recent episodes of angina or shortness of breath.   Hospital  Course:  Victor Rojas is a 77 y.o. male is 3 Days Post-Op Procedure(s): ENDARTERECTOMY FEMORAL  ( SFA STENT ) APPLICATION OF CELL SAVER    Patient admitted to hospital after bilateral femoral endarterectomy. He did well. He was slow to mobilize, but ultimately did quite well. He was discharged on POD#3 tolerating a diet, with pain well controlled, with good distal perfusion.   DISCHARGE EXAM BP 123/62 (BP Location: Right Arm)   Pulse 89   Temp 97.9 F (36.6 C)   Resp 16   Ht  (1.727 m)   Wt 89.2 kg   SpO2 100%   BMI 29.89 kg/m  No acute distress Regular rate and rhythm Unlabored breathing Incisions clean and dry Brisk doppler flow in bilateral feet  Significant Diagnostic Studies: CBC    Component Value Date/Time   WBC 10.4 03/21/2023 0018   RBC 2.75 (L) 03/21/2023 0018   HGB 9.7 (L) 03/21/2023 0018   HCT 29.5 (L) 03/21/2023 0018   PLT 120 (L) 03/21/2023 0018   MCV 107.3 (H) 03/21/2023 0018   MCH 35.3 (H) 03/21/2023 0018   MCHC 32.9 03/21/2023 0018   RDW 14.7 03/21/2023 0018   LYMPHSABS 1.0 03/18/2023 1339   MONOABS 0.4 03/18/2023 1339   EOSABS 0.1 03/18/2023 1339   BASOSABS 0.0 03/18/2023 1339    BMET    Component Value Date/Time   NA 139 03/21/2023 0018   K 5.0 03/21/2023 0018   CL 110 03/21/2023 0018   CO2 21 (L) 03/21/2023 0018   GLUCOSE 182 (H) 03/21/2023 0018   BUN 29 (H) 03/21/2023 0018   CREATININE 1.11 03/21/2023 0018  CALCIUM 7.7 (L) 03/21/2023 0018   GFRNONAA >60 03/21/2023 0018   GFRAA >60 12/20/2018 0429    COAG estimated creatinine clearance is 61.4 mL/min (by C-G formula based on SCr of 1.11 mg/dL).  No results found for: "PTT"  Disposition:  Discharge to :Home Discharge Instructions     Call MD for:  redness, tenderness, or signs of infection (pain, swelling, bleeding, redness, odor or green/yellow discharge around incision site)   Complete by: As directed    Call MD for:  severe or increased pain, loss or decreased  feeling  in affected limb(s)   Complete by: As directed    Call MD for:  temperature >100.5   Complete by: As directed    Change dressing (specify)   Complete by: As directed    Keep dry gauze and tape over the wound at all times. Change every daily and as needed for soilage. Goal is to keep wounds as clean and dry as possible.   Driving Restrictions   Complete by: As directed    No driving while taking narcotic pain medicine   Increase activity slowly   Complete by: As directed    Walk with assistance use walker or cane as needed   Lifting restrictions   Complete by: As directed    No heavy lifting (>30 lbs) for 4 weeks   May shower    Complete by: As directed    OK to shower 03/24/23. Let soap and water run over the wound. Do not scrub the wounds.   May walk up steps   Complete by: As directed    Resume previous diet   Complete by: As directed       Allergies as of 03/23/2023   No Known Allergies      Medication List     TAKE these medications    allopurinol 300 MG tablet Commonly known as: ZYLOPRIM Take 300 mg by mouth daily.   amLODipine 5 MG tablet Commonly known as: NORVASC Take 1 tablet (5 mg total) by mouth daily.   ascorbic acid 500 MG tablet Commonly known as: VITAMIN C Take 500 mg by mouth daily.   aspirin EC 81 MG tablet Take 81 mg by mouth daily.   atorvastatin 40 MG tablet Commonly known as: LIPITOR Take 1 tablet (40 mg total) by mouth daily.   clopidogrel 75 MG tablet Commonly known as: Plavix Take 1 tablet (75 mg total) by mouth daily.   cyanocobalamin 1000 MCG tablet Commonly known as: VITAMIN B12 Take 1,000 mcg by mouth daily.   D3-1000 25 MCG (1000 UT) tablet Generic drug: Cholecalciferol Take 1,000 Units by mouth daily.   indomethacin 25 MG capsule Commonly known as: INDOCIN Take 25 mg by mouth 2 (two) times daily as needed.   lisinopril-hydrochlorothiazide 20-12.5 MG tablet Commonly known as: ZESTORETIC Take 1 tablet by mouth  2 (two) times daily. What changed: when to take this   metFORMIN 500 MG tablet Commonly known as: GLUCOPHAGE Take 500 mg by mouth daily with breakfast.   metoprolol tartrate 25 MG tablet Commonly known as: LOPRESSOR Take 25 mg by mouth daily.   Multi-Vitamins Tabs Take 1 tablet by mouth daily.   oxyCODONE-acetaminophen 5-325 MG tablet Commonly known as: Percocet Take 1 tablet by mouth every 4 (four) hours as needed for severe pain.   pantoprazole 40 MG tablet Commonly known as: PROTONIX Take 40 mg by mouth at bedtime.   vitamin E 180 MG (400 UNITS) capsule Generic drug: vitamin E  Take 400 Units by mouth daily.               Discharge Care Instructions  (From admission, onward)           Start     Ordered   03/23/23 0000  Change dressing (specify)       Comments: Keep dry gauze and tape over the wound at all times. Change every daily and as needed for soilage. Goal is to keep wounds as clean and dry as possible.   03/23/23 0842             Rande Brunt. Lenell Antu, MD [facility] Phone Number: [phone] 03/23/2023 8:44 AM

## 2023-03-23 NOTE — TOC Transition Note (Signed)
Transition of Care [facility]) - CM/SW Discharge Note   Patient Details  Name: Victor Rojas MRN: [id number] Date of Birth: 04/22/1946  [facility] CM/SW Contact:  Bing Quarry, RN Phone Number: 03/23/2023, 2:29 PM   Clinical Narrative: 03/23/23: Pt evaluation recommended HH PT on discharge. RN CM noted patient was discharge to home/self care orders. No DME or HH orders seen. Messaged providers to see if patient needed to be contacted at home to set up [facility] via PCP provider. Pending a return message. Gabriel Cirri RN CM             Patient Goals and CMS Choice      Discharge Placement                         Discharge Plan and Services Additional resources added to the After Visit Summary for                                       Social Determinants of Health (SDOH) Interventions SDOH Screenings   Food Insecurity: No Food Insecurity (03/20/2023)  Housing: Low Risk  (03/20/2023)  Transportation Needs: No Transportation Needs (03/20/2023)  Utilities: Not At Risk (03/20/2023)  Tobacco Use: Medium Risk (03/21/2023)     Readmission Risk Interventions     No data to display

## 2023-03-23 NOTE — Evaluation (Signed)
Physical Therapy Evaluation Patient Details Name: Victor Rojas MRN: [id number] DOB: [birth year] Today's Date: 03/23/2023  History of Present Illness  Pt is a 77 yo white male that presents s/p 3 days po for bilateral femoral, profunda femoris, and superficial femoral artery endarterectomies.  Clinical Impression  Pt presents supine in bed with visitor present and agreeable to PT. He shows increased pain with movement along surgical incisions and decreased mobility requiring significant BUE support with use of 2WW to ambulate as well as additional time. He lives along but has support from daughter who lives close by to his house that will be checking in on him regularly. PT recommending HHPT for additional skilled PT to address these aforementioned deficits so pt can return to prior level of pain free mobility to remain living independently.        Recommendations for follow up therapy are one component of a multi-disciplinary discharge planning process, led by the attending physician.  Recommendations may be updated based on patient status, additional functional criteria and insurance authorization.  Follow Up Recommendations       Assistance Recommended at Discharge PRN  Patient can return home with the following  Assistance with cooking/housework;Assist for transportation;A little help with bathing/dressing/bathroom    Equipment Recommendations    Recommendations for Other Services       Functional Status Assessment Patient has had a recent decline in their functional status and demonstrates the ability to make significant improvements in function in a reasonable and predictable amount of time.     Precautions / Restrictions Precautions Precautions: None Restrictions Weight Bearing Restrictions: No      Mobility  Bed Mobility Overal bed mobility: Modified Independent             General bed mobility comments: Requires significant BUE support and increased time     Transfers Overall transfer level: Modified independent Equipment used: Rolling walker (2 wheels)                    Ambulation/Gait Ambulation/Gait assistance: Modified independent (Device/Increase time) Gait Distance (Feet): 100 Feet Assistive device: Rolling walker (2 wheels) Gait Pattern/deviations: Step-to pattern, Decreased step length - right, Decreased step length - left, Decreased stride length Gait velocity: Slow        Stairs            Wheelchair Mobility    Modified Rankin (Stroke Patients Only)       Balance Overall balance assessment: Modified Independent                                           Pertinent Vitals/Pain Pain Assessment Pain Assessment: 0-10 Pain Score: 5  Pain Location: Bilateral groin incisions Pain Descriptors / Indicators: Aching Pain Intervention(s): Monitored during session, Limited activity within patient's tolerance    Home Living Family/patient expects to be discharged to:: Private residence Living Arrangements: Alone Available Help at Discharge: Family (Daughter) Type of Home: Mobile home Home Access: Ramped entrance       Home Layout: One level Home Equipment: Agricultural consultant (2 wheels);Cane - single point;Grab bars - tub/shower      Prior Function Prior Level of Function : Independent/Modified Independent                     Hand Dominance        Extremity/Trunk Assessment  Upper Extremity Assessment Upper Extremity Assessment: Defer to OT evaluation    Lower Extremity Assessment Lower Extremity Assessment: Overall WFL for tasks assessed    Cervical / Trunk Assessment Cervical / Trunk Assessment: Normal  Communication   Communication: No difficulties  Cognition Arousal/Alertness: Awake/alert Behavior During Therapy: WFL for tasks assessed/performed Overall Cognitive Status: Within Functional Limits for tasks assessed                                           General Comments      Exercises     Assessment/Plan    PT Assessment Patient needs continued PT services  PT Problem List Decreased mobility;Pain       PT Treatment Interventions DME instruction;Gait training;Balance training    PT Goals (Current goals can be found in the Care Plan section)  Acute Rehab PT Goals Patient Stated Goal: To return home safely PT Goal Formulation: With patient Time For Goal Achievement: 04/06/23 Potential to Achieve Goals: Good    Frequency Min 2X/week     Co-evaluation               AM-PAC PT "6 Clicks" Mobility  Outcome Measure Help needed turning from your back to your side while in a flat bed without using bedrails?: None Help needed moving from lying on your back to sitting on the side of a flat bed without using bedrails?: None Help needed moving to and from a bed to a chair (including a wheelchair)?: A Little Help needed standing up from a chair using your arms (e.g., wheelchair or bedside chair)?: None Help needed to walk in hospital room?: A Little Help needed climbing 3-5 steps with a railing? : A Lot 6 Click Score: 20    End of Session Equipment Utilized During Treatment: Gait belt Activity Tolerance: Patient tolerated treatment well Patient left: in bed;with call bell/phone within reach;with family/visitor present Nurse Communication: Mobility status PT Visit Diagnosis: Other abnormalities of gait and mobility (R26.89);Difficulty in walking, not elsewhere classified (R26.2)    Time: 1000-1015 PT Time Calculation (min) (ACUTE ONLY): 15 min   Charges:   PT Evaluation $PT Eval Low Complexity: 1 Low PT Treatments $Therapeutic Activity: 8-22 mins      Ellin Goodie PT, DPT  03/23/2023, 10:50 AM

## 2023-03-23 NOTE — Progress Notes (Signed)
Patient notified of discharge, instructions and materials provided to patient, all questions and concerns addressed at this time. Prescription and paperwork sent with patient, belongings gathered by patient. Patient escorted off unit by member of volunteer staff.

## 2023-03-23 NOTE — TOC Progression Note (Addendum)
Transition of Care [facility]) - Progression Note    Patient Details  Name: CALIX MCNEISH MRN: [id number] Date of Birth: Oct 04, 1946  [facility] CM/SW Contact  Bing Quarry, RN Phone Number: 03/23/2023, 9:08 AM  Clinical Narrative: 03/23/23: Per vascular NP notes from 03/22/23, discharge plan was discussed with patient pending PT/OT evaluation and treat last evening and again today prior to discharge. Note also indicated patient expressed need for a RW due to soreness/weakness. PT/OT orders in but note notes in as yet. Discharge summary and orders are in this am. Messaged providers to clarify on PT/OT eval and treat prior to discharge and need to DME RW. Via secure chat to listed attending and provider writing dc orders/summary. Pending a response. Gabriel Cirri RN CM   Update: Provider acknowledged discharge pending PT/OT evaluation and recommendations. Gabriel Cirri RN CM         Expected Discharge Plan and Services         Expected Discharge Date: 03/23/23                                     Social Determinants of Health (SDOH) Interventions SDOH Screenings   Food Insecurity: No Food Insecurity (03/20/2023)  Housing: Low Risk  (03/20/2023)  Transportation Needs: No Transportation Needs (03/20/2023)  Utilities: Not At Risk (03/20/2023)  Tobacco Use: Medium Risk (03/21/2023)    Readmission Risk Interventions     No data to display

## 2023-03-25 ENCOUNTER — Encounter (INDEPENDENT_AMBULATORY_CARE_PROVIDER_SITE_OTHER): Payer: Medicare Other

## 2023-03-25 ENCOUNTER — Ambulatory Visit (INDEPENDENT_AMBULATORY_CARE_PROVIDER_SITE_OTHER): Payer: Medicare Other | Admitting: Vascular Surgery

## 2023-03-25 LAB — GLUCOSE, CAPILLARY: Glucose-Capillary: 136 mg/dL — ABNORMAL HIGH (ref 70–99)

## 2023-03-28 ENCOUNTER — Telehealth (INDEPENDENT_AMBULATORY_CARE_PROVIDER_SITE_OTHER): Payer: Self-pay

## 2023-03-28 ENCOUNTER — Telehealth (INDEPENDENT_AMBULATORY_CARE_PROVIDER_SITE_OTHER): Payer: Self-pay | Admitting: Vascular Surgery

## 2023-03-28 ENCOUNTER — Encounter: Payer: Self-pay | Admitting: Vascular Surgery

## 2023-03-28 NOTE — Telephone Encounter (Signed)
Patient called stating he has bruising on his left leg along with soreness and trouble getting up. Patient had a femoral endarterectomy with Dr. Gilda Crease on 03/20/23. I advised that this normal after a surgery to have some soreness and bruising. Patient has an follow up appt on 04/08/23 with a Sheppard Plumber NP. Patient was advised that if he started to have other symptoms or what is going on now gets worse to call our office.

## 2023-03-28 NOTE — Telephone Encounter (Signed)
Patient called and stated his leg is sore and tight and bruises on left side of leg.  He also states when he get up and try to move around, he has pain and when he touch it, he has pain.  Wants to know if he soul come in to be seen.  P;ease advise.

## 2023-03-28 NOTE — Telephone Encounter (Signed)
I spoke with Dr Gilda Crease and he advise that pain and swelling is to be expected after surgery. Patient  should elevate and utilize pain medication as needed for relief. I left a detailed message voicemail.

## 2023-04-08 ENCOUNTER — Encounter (INDEPENDENT_AMBULATORY_CARE_PROVIDER_SITE_OTHER): Payer: Self-pay | Admitting: Nurse Practitioner

## 2023-04-08 ENCOUNTER — Ambulatory Visit (INDEPENDENT_AMBULATORY_CARE_PROVIDER_SITE_OTHER): Payer: Medicare Other | Admitting: Nurse Practitioner

## 2023-04-08 VITALS — BP 128/66 | HR 74 | Resp 17 | Ht 68.0 in | Wt 203.4 lb

## 2023-04-08 DIAGNOSIS — I739 Peripheral vascular disease, unspecified: Secondary | ICD-10-CM

## 2023-04-08 MED ORDER — SULFAMETHOXAZOLE-TRIMETHOPRIM 800-160 MG PO TABS: 1.0000 | ORAL_TABLET | Freq: Two times a day (BID) | ORAL | 0 refills | Status: DC

## 2023-04-08 NOTE — Progress Notes (Signed)
Subjective:    Patient ID: Victor Rojas, male    DOB: 01-19-1946, 77 y.o.   MRN: [id number] Chief Complaint  Patient presents with   Follow-up    Victor Rojas is a 77 year old male who returns today following bilateral femoral endarterectomy on 03/20/2023.  The patient's largest complaint is about pain and discomfort due to the staples still in place.  They are very tender.  He has a small dehiscence in the right groin area.  He is having some swelling and some discomfort in the inner thighs which I discussed with patient is not uncommon post intervention.  Otherwise the patient has been doing well.    Review of Systems  Cardiovascular:  Positive for leg swelling.  Skin:  Positive for color change and wound.  All other systems reviewed and are negative.      Objective:   Physical Exam Vitals reviewed.  HENT:     Head: Normocephalic.  Cardiovascular:     Rate and Rhythm: Normal rate.  Pulmonary:     Effort: Pulmonary effort is normal.  Musculoskeletal:     Right lower leg: Edema present.     Left lower leg: Edema present.  Skin:    General: Skin is warm and dry.  Neurological:     Mental Status: He is alert and oriented to person, place, and time.  Psychiatric:        Mood and Affect: Mood normal.        Behavior: Behavior normal.        Thought Content: Thought content normal.        Judgment: Judgment normal.     BP 128/66 (BP Location: Right Arm)   Pulse 74   Resp 17   Ht 5\' 8"  (1.727 m)   Wt 203 lb 6.4 oz (92.3 kg)   BMI 30.93 kg/m   Past Medical History:  Diagnosis Date   Adenoma of colon    Arthritis    Bilateral carotid artery stenosis    Diverticulosis    GERD (gastroesophageal reflux disease)    Hiatal hernia    History of gout    History of kidney stones    Hyperlipemia    Hypertension    Paroxysmal SVT (supraventricular tachycardia)    Peripheral vascular disease (HCC)    TIA (transient ischemic attack)    Type 2 diabetes mellitus  without complication (HCC)    Umbilical hernia     Social History   Socioeconomic History   Marital status: Widowed    Spouse name: Not on file   Number of children: Not on file   Years of education: Not on file   Highest education level: Not on file  Occupational History   Not on file  Tobacco Use   Smoking status: Former    Types: Cigarettes    Quit date: 02/03/1989    Years since quitting: 34.1   Smokeless tobacco: Never  Vaping Use   Vaping Use: Never used  Substance and Sexual Activity   Alcohol use: Not Currently    Alcohol/week: 6.0 standard drinks of alcohol    Types: 6 Cans of beer per week   Drug use: No   Sexual activity: Not on file  Other Topics Concern   Not on file  Social History Narrative   Lives alone   Social Determinants of Health   Financial Resource Strain: Not on file  Food Insecurity: No Food Insecurity (03/20/2023)   Hunger Vital Sign  Worried About Programme researcher, broadcasting/film/video in the Last Year: Never true    Ran Out of Food in the Last Year: Never true  Transportation Needs: No Transportation Needs (03/20/2023)   PRAPARE - Administrator, Civil Service (Medical): No    Lack of Transportation (Non-Medical): No  Physical Activity: Not on file  Stress: Not on file  Social Connections: Not on file  Intimate Partner Violence: Not At Risk (03/20/2023)   Humiliation, Afraid, Rape, and Kick questionnaire    Fear of Current or Ex-Partner: No    Emotionally Abused: No    Physically Abused: No    Sexually Abused: No    Past Surgical History:  Procedure Laterality Date   BILIARY DILATION  01/17/2023   Procedure: BILIARY DILATION;  Surgeon: Lynann Bologna, MD;  Location: [facility] ENDOSCOPY;  Service: Gastroenterology;;   CATARACT EXTRACTION, BILATERAL Bilateral    COLONOSCOPY     COLONOSCOPY WITH PROPOFOL N/A 06/18/2016   Procedure: COLONOSCOPY WITH PROPOFOL;  Surgeon: Christena Deem, MD;  Location: [facility] ENDOSCOPY;  Service: Endoscopy;   Laterality: N/A;   COLONOSCOPY WITH PROPOFOL N/A 01/09/2021   Procedure: COLONOSCOPY WITH PROPOFOL;  Surgeon: Regis Bill, MD;  Location: [facility] ENDOSCOPY;  Service: Endoscopy;  Laterality: N/A;   ENDARTERECTOMY Right 12/19/2018   Procedure: ENDARTERECTOMY CAROTID;  Surgeon: Renford Dills, MD;  Location: [facility] ORS;  Service: Vascular;  Laterality: Right;   ENDARTERECTOMY FEMORAL Bilateral 03/20/2023   Procedure: ENDARTERECTOMY FEMORAL  ( SFA STENT );  Surgeon: Renford Dills, MD;  Location: [facility] ORS;  Service: Vascular;  Laterality: Bilateral;   ENDOSCOPIC RETROGRADE CHOLANGIOPANCREATOGRAPHY (ERCP) WITH PROPOFOL N/A 01/17/2023   Procedure: ENDOSCOPIC RETROGRADE CHOLANGIOPANCREATOGRAPHY (ERCP) WITH PROPOFOL;  Surgeon: Lynann Bologna, MD;  Location: [facility] ENDOSCOPY;  Service: Gastroenterology;  Laterality: N/A;   ESOPHAGOGASTRODUODENOSCOPY (EGD) WITH PROPOFOL N/A 06/18/2016   Procedure: ESOPHAGOGASTRODUODENOSCOPY (EGD) WITH PROPOFOL;  Surgeon: Christena Deem, MD;  Location: [facility] ENDOSCOPY;  Service: Endoscopy;  Laterality: N/A;   ESOPHAGOGASTRODUODENOSCOPY (EGD) WITH PROPOFOL N/A 01/09/2021   Procedure: ESOPHAGOGASTRODUODENOSCOPY (EGD) WITH PROPOFOL;  Surgeon: Regis Bill, MD;  Location: [facility] ENDOSCOPY;  Service: Endoscopy;  Laterality: N/A;   LOWER EXTREMITY ANGIOGRAPHY Right 01/25/2023   Procedure: Lower Extremity Angiography;  Surgeon: Renford Dills, MD;  Location: [facility] INVASIVE CV LAB;  Service: Cardiovascular;  Laterality: Right;   REMOVAL OF STONES  01/17/2023   Procedure: REMOVAL OF STONES;  Surgeon: Lynann Bologna, MD;  Location: [facility] ENDOSCOPY;  Service: Gastroenterology;;   ROBOTIC ASSISTED LAPAROSCOPIC CHOLECYSTECTOMY  03/02/2022   SPHINCTEROTOMY  01/17/2023   Procedure: Dennison Mascot;  Surgeon: Lynann Bologna, MD;  Location: [facility] ENDOSCOPY;  Service: Gastroenterology;;   TONSILLECTOMY     UMBILICAL HERNIA REPAIR      Family History  Problem Relation Age of Onset    Cataracts Mother    Heart attack Mother    Cancer Sister     No Known Allergies     Latest Ref Rng & Units 03/21/2023   12:18 AM 03/20/2023    4:06 PM 03/18/2023    1:39 PM  CBC  WBC 4.0 - 10.5 K/uL 10.4  10.5  5.2   Hemoglobin 13.0 - 17.0 g/dL 9.7  16.1  09.6   Hematocrit 39.0 - 52.0 % 29.5  33.1  37.0   Platelets 150 - 400 K/uL 120  125  137       CMP     Component Value Date/Time   NA 139 03/21/2023 0018   K  5.0 03/21/2023 0018   CL 110 03/21/2023 0018   CO2 21 (L) 03/21/2023 0018   GLUCOSE 182 (H) 03/21/2023 0018   BUN 29 (H) 03/21/2023 0018   CREATININE 1.11 03/21/2023 0018   CALCIUM 7.7 (L) 03/21/2023 0018   PROT 6.3 (L) 01/21/2023 0549   ALBUMIN 2.8 (L) 01/21/2023 0549   AST 96 (H) 01/21/2023 0549   ALT 270 (H) 01/21/2023 0549   ALKPHOS 236 (H) 01/21/2023 0549   BILITOT 2.4 (H) 01/21/2023 0549   GFRNONAA >60 03/21/2023 0018   GFRAA >60 12/20/2018 0429     No results found.     Assessment & Plan:   1. PAD (peripheral artery disease) (HCC) The patient notes that the walking is somewhat better but he is largest issues due to discomfort in his groins.  The groins were very red and tender and so we will send in antibiotics as I am little concerned there may be some underlying infection.  All staples removed today with Steri-Strips applied.  The right groin has just a small area of dehiscence he will cover with triple antibiotic ointment and change daily.  Will have her return in 2 to 3 weeks with ABIs as well as to evaluate wound.   Current Outpatient Medications on File Prior to Visit  Medication Sig Dispense Refill   allopurinol (ZYLOPRIM) 300 MG tablet Take 300 mg by mouth daily.     amLODipine (NORVASC) 5 MG tablet Take 1 tablet (5 mg total) by mouth daily. 90 tablet 0   aspirin EC 81 MG tablet Take 81 mg by mouth daily.      atorvastatin (LIPITOR) 40 MG tablet Take 1 tablet (40 mg total) by mouth daily.     Cholecalciferol (D3-1000) 25 MCG (1000 UT)  tablet Take 1,000 Units by mouth daily.     clopidogrel (PLAVIX) 75 MG tablet Take 1 tablet (75 mg total) by mouth daily. 30 tablet 11   cyanocobalamin (VITAMIN B12) 1000 MCG tablet Take 1,000 mcg by mouth daily.     indomethacin (INDOCIN) 25 MG capsule Take 25 mg by mouth 2 (two) times daily as needed.     lisinopril-hydrochlorothiazide (ZESTORETIC) 20-12.5 MG tablet Take 1 tablet by mouth 2 (two) times daily. (Patient taking differently: Take 1 tablet by mouth daily.)     metFORMIN (GLUCOPHAGE) 500 MG tablet Take 500 mg by mouth daily with breakfast.     metoprolol tartrate (LOPRESSOR) 25 MG tablet Take 25 mg by mouth daily.     Multiple Vitamin (MULTI-VITAMINS) TABS Take 1 tablet by mouth daily.      oxyCODONE-acetaminophen (PERCOCET) 5-325 MG tablet Take 1 tablet by mouth every 4 (four) hours as needed for severe pain. 30 tablet 0   pantoprazole (PROTONIX) 40 MG tablet Take 40 mg by mouth at bedtime.     vitamin C (ASCORBIC ACID) 500 MG tablet Take 500 mg by mouth daily.     vitamin E 180 MG (400 UNITS) capsule Take 400 Units by mouth daily.     No current facility-administered medications on file prior to visit.    There are no Patient Instructions on file for this visit. No follow-ups on file.   Georgiana Spinner, NP

## 2023-04-17 ENCOUNTER — Other Ambulatory Visit (INDEPENDENT_AMBULATORY_CARE_PROVIDER_SITE_OTHER): Payer: Self-pay | Admitting: Nurse Practitioner

## 2023-05-01 ENCOUNTER — Encounter (INDEPENDENT_AMBULATORY_CARE_PROVIDER_SITE_OTHER): Payer: Self-pay | Admitting: Nurse Practitioner

## 2023-05-01 ENCOUNTER — Ambulatory Visit (INDEPENDENT_AMBULATORY_CARE_PROVIDER_SITE_OTHER): Payer: Medicare Other | Admitting: Nurse Practitioner

## 2023-05-01 ENCOUNTER — Ambulatory Visit (INDEPENDENT_AMBULATORY_CARE_PROVIDER_SITE_OTHER): Payer: Medicare Other

## 2023-05-01 VITALS — BP 132/75 | HR 66 | Resp 16 | Wt 198.4 lb

## 2023-05-01 DIAGNOSIS — I739 Peripheral vascular disease, unspecified: Secondary | ICD-10-CM

## 2023-05-01 DIAGNOSIS — Z9889 Other specified postprocedural states: Secondary | ICD-10-CM

## 2023-05-01 MED ORDER — MEDIHONEY WOUND/BURN DRESSING EX PSTE: 1.0000 | PASTE | Freq: Every day | CUTANEOUS | 0 refills | Status: AC

## 2023-05-01 MED ORDER — CLOPIDOGREL BISULFATE 75 MG PO TABS: 75.0000 mg | ORAL_TABLET | Freq: Every day | ORAL | 3 refills | Status: DC

## 2023-05-01 MED ORDER — SULFAMETHOXAZOLE-TRIMETHOPRIM 800-160 MG PO TABS: 1.0000 | ORAL_TABLET | Freq: Two times a day (BID) | ORAL | 0 refills | Status: AC

## 2023-05-02 ENCOUNTER — Encounter (INDEPENDENT_AMBULATORY_CARE_PROVIDER_SITE_OTHER): Payer: Self-pay | Admitting: Nurse Practitioner

## 2023-05-02 LAB — VAS US ABI WITH/WO TBI
Left ABI: 1
Right ABI: 0.76

## 2023-05-06 NOTE — Progress Notes (Signed)
Subjective:    Patient ID: Victor Rojas, male    DOB: [birth year], 77 y.o.   MRN: [id number] Chief Complaint  Patient presents with   Follow-up    [facility] follow up with ABI    Victor Rojas is a 77 year old male who returns today following bilateral femoral endarterectomy on 03/20/2023.  The patient notes that there is discomfort in his right groin area.  He has a small dehiscence in the right groin area.  The swelling has improved but there is still some distally.  He has been using Neosporin on the wound  Today noninvasive studies show an ABI of 0.76 on the right and 1.0 on the left.  The patient has biphasic tibial artery waveforms with dampened toe waveforms on the right and good toe waveforms on the left.  Additional images reveal that the patient has a patent common femoral artery down with strong flow to the profunda however there is no occlusion of the SFA but there is reconstitution from the profunda.    Review of Systems  Cardiovascular:  Positive for leg swelling.  Skin:  Positive for wound.  All other systems reviewed and are negative.      Objective:   Physical Exam Vitals reviewed.  HENT:     Head: Normocephalic.  Cardiovascular:     Rate and Rhythm: Normal rate.  Pulmonary:     Effort: Pulmonary effort is normal.  Musculoskeletal:     Right lower leg: Edema present.     Left lower leg: Edema present.  Skin:    General: Skin is warm and dry.  Neurological:     Mental Status: He is alert and oriented to person, place, and time.  Psychiatric:        Mood and Affect: Mood normal.        Behavior: Behavior normal.        Thought Content: Thought content normal.        Judgment: Judgment normal.     BP 132/75 (BP Location: Left Arm)   Pulse 66   Resp 16   Wt 198 lb 6.4 oz (90 kg)   BMI 30.17 kg/m   Past Medical History:  Diagnosis Date   Adenoma of colon    Arthritis    Bilateral carotid artery stenosis    Diverticulosis    GERD (gastroesophageal  reflux disease)    Hiatal hernia    History of gout    History of kidney stones    Hyperlipemia    Hypertension    Paroxysmal SVT (supraventricular tachycardia)    Peripheral vascular disease (HCC)    TIA (transient ischemic attack)    Type 2 diabetes mellitus without complication (HCC)    Umbilical hernia     Social History   Socioeconomic History   Marital status: Widowed    Spouse name: Not on file   Number of children: Not on file   Years of education: Not on file   Highest education level: Not on file  Occupational History   Not on file  Tobacco Use   Smoking status: Former    Types: Cigarettes    Quit date: 02/03/1989    Years since quitting: 34.2   Smokeless tobacco: Never  Vaping Use   Vaping Use: Never used  Substance and Sexual Activity   Alcohol use: Not Currently    Alcohol/week: 6.0 standard drinks of alcohol    Types: 6 Cans of beer per week   Drug use: No  Sexual activity: Not on file  Other Topics Concern   Not on file  Social History Narrative   Lives alone   Social Determinants of Health   Financial Resource Strain: Not on file  Food Insecurity: No Food Insecurity (03/20/2023)   Hunger Vital Sign    Worried About Running Out of Food in the Last Year: Never true    Ran Out of Food in the Last Year: Never true  Transportation Needs: No Transportation Needs (03/20/2023)   PRAPARE - Administrator, Civil Service (Medical): No    Lack of Transportation (Non-Medical): No  Physical Activity: Not on file  Stress: Not on file  Social Connections: Not on file  Intimate Partner Violence: Not At Risk (03/20/2023)   Humiliation, Afraid, Rape, and Kick questionnaire    Fear of Current or Ex-Partner: No    Emotionally Abused: No    Physically Abused: No    Sexually Abused: No    Past Surgical History:  Procedure Laterality Date   BILIARY DILATION  01/17/2023   Procedure: BILIARY DILATION;  Surgeon: Lynann Bologna, MD;  Location: [facility]  ENDOSCOPY;  Service: Gastroenterology;;   CATARACT EXTRACTION, BILATERAL Bilateral    COLONOSCOPY     COLONOSCOPY WITH PROPOFOL N/A 06/18/2016   Procedure: COLONOSCOPY WITH PROPOFOL;  Surgeon: Christena Deem, MD;  Location: [facility] ENDOSCOPY;  Service: Endoscopy;  Laterality: N/A;   COLONOSCOPY WITH PROPOFOL N/A 01/09/2021   Procedure: COLONOSCOPY WITH PROPOFOL;  Surgeon: Regis Bill, MD;  Location: [facility] ENDOSCOPY;  Service: Endoscopy;  Laterality: N/A;   ENDARTERECTOMY Right 12/19/2018   Procedure: ENDARTERECTOMY CAROTID;  Surgeon: Renford Dills, MD;  Location: [facility] ORS;  Service: Vascular;  Laterality: Right;   ENDARTERECTOMY FEMORAL Bilateral 03/20/2023   Procedure: ENDARTERECTOMY FEMORAL  ( SFA STENT );  Surgeon: Renford Dills, MD;  Location: [facility] ORS;  Service: Vascular;  Laterality: Bilateral;   ENDOSCOPIC RETROGRADE CHOLANGIOPANCREATOGRAPHY (ERCP) WITH PROPOFOL N/A 01/17/2023   Procedure: ENDOSCOPIC RETROGRADE CHOLANGIOPANCREATOGRAPHY (ERCP) WITH PROPOFOL;  Surgeon: Lynann Bologna, MD;  Location: [facility] ENDOSCOPY;  Service: Gastroenterology;  Laterality: N/A;   ESOPHAGOGASTRODUODENOSCOPY (EGD) WITH PROPOFOL N/A 06/18/2016   Procedure: ESOPHAGOGASTRODUODENOSCOPY (EGD) WITH PROPOFOL;  Surgeon: Christena Deem, MD;  Location: [facility] ENDOSCOPY;  Service: Endoscopy;  Laterality: N/A;   ESOPHAGOGASTRODUODENOSCOPY (EGD) WITH PROPOFOL N/A 01/09/2021   Procedure: ESOPHAGOGASTRODUODENOSCOPY (EGD) WITH PROPOFOL;  Surgeon: Regis Bill, MD;  Location: [facility] ENDOSCOPY;  Service: Endoscopy;  Laterality: N/A;   LOWER EXTREMITY ANGIOGRAPHY Right 01/25/2023   Procedure: Lower Extremity Angiography;  Surgeon: Renford Dills, MD;  Location: [facility] INVASIVE CV LAB;  Service: Cardiovascular;  Laterality: Right;   REMOVAL OF STONES  01/17/2023   Procedure: REMOVAL OF STONES;  Surgeon: Lynann Bologna, MD;  Location: [facility] ENDOSCOPY;  Service: Gastroenterology;;   ROBOTIC ASSISTED LAPAROSCOPIC  CHOLECYSTECTOMY  03/02/2022   SPHINCTEROTOMY  01/17/2023   Procedure: Dennison Mascot;  Surgeon: Lynann Bologna, MD;  Location: [facility] ENDOSCOPY;  Service: Gastroenterology;;   TONSILLECTOMY     UMBILICAL HERNIA REPAIR      Family History  Problem Relation Age of Onset   Cataracts Mother    Heart attack Mother    Cancer Sister     No Known Allergies     Latest Ref Rng & Units 03/21/2023   12:18 AM 03/20/2023    4:06 PM 03/18/2023    1:39 PM  CBC  WBC 4.0 - 10.5 K/uL 10.4  10.5  5.2   Hemoglobin 13.0 - 17.0 g/dL 9.7  10.7  12.4   Hematocrit 39.0 - 52.0 % 29.5  33.1  37.0   Platelets 150 - 400 K/uL 120  125  137       CMP     Component Value Date/Time   NA 139 03/21/2023 0018   K 5.0 03/21/2023 0018   CL 110 03/21/2023 0018   CO2 21 (L) 03/21/2023 0018   GLUCOSE 182 (H) 03/21/2023 0018   BUN 29 (H) 03/21/2023 0018   CREATININE 1.11 03/21/2023 0018   CALCIUM 7.7 (L) 03/21/2023 0018   PROT 6.3 (L) 01/21/2023 0549   ALBUMIN 2.8 (L) 01/21/2023 0549   AST 96 (H) 01/21/2023 0549   ALT 270 (H) 01/21/2023 0549   ALKPHOS 236 (H) 01/21/2023 0549   BILITOT 2.4 (H) 01/21/2023 0549   GFRNONAA >60 03/21/2023 0018   GFRAA >60 12/20/2018 0429     No results found.     Assessment & Plan:   1. PAD (peripheral artery disease) (HCC)  The patient notes that he was about to run out of his Plavix and so we have sent in a refill for him.  He is advised to continue with Plavix and aspirin.  He is advised to stop using Neosporin on his wound as this can cause some rash-like effects.  Instead he is advised to utilize Medihoney and a prescription has been sent in for this today as well.  Will have the patient return in 4 weeks for wound reevaluation.  Current Outpatient Medications on File Prior to Visit  Medication Sig Dispense Refill   allopurinol (ZYLOPRIM) 300 MG tablet Take 300 mg by mouth daily.     aspirin EC 81 MG tablet Take 81 mg by mouth daily.      atorvastatin (LIPITOR) 40 MG  tablet Take 1 tablet (40 mg total) by mouth daily.     Cholecalciferol (D3-1000) 25 MCG (1000 UT) tablet Take 1,000 Units by mouth daily.     cyanocobalamin (VITAMIN B12) 1000 MCG tablet Take 1,000 mcg by mouth daily.     indomethacin (INDOCIN) 25 MG capsule Take 25 mg by mouth 2 (two) times daily as needed.     lisinopril-hydrochlorothiazide (ZESTORETIC) 20-12.5 MG tablet Take 1 tablet by mouth 2 (two) times daily. (Patient taking differently: Take 1 tablet by mouth daily.)     metFORMIN (GLUCOPHAGE) 500 MG tablet Take 500 mg by mouth daily with breakfast.     metoprolol tartrate (LOPRESSOR) 25 MG tablet Take 25 mg by mouth daily.     Multiple Vitamin (MULTI-VITAMINS) TABS Take 1 tablet by mouth daily.      oxyCODONE-acetaminophen (PERCOCET) 5-325 MG tablet Take 1 tablet by mouth every 4 (four) hours as needed for severe pain. 30 tablet 0   pantoprazole (PROTONIX) 40 MG tablet Take 40 mg by mouth at bedtime.     vitamin C (ASCORBIC ACID) 500 MG tablet Take 500 mg by mouth daily.     vitamin E 180 MG (400 UNITS) capsule Take 400 Units by mouth daily.     amLODipine (NORVASC) 5 MG tablet Take 1 tablet (5 mg total) by mouth daily. 90 tablet 0   No current facility-administered medications on file prior to visit.    There are no Patient Instructions on file for this visit. No follow-ups on file.   Georgiana Spinner, NP

## 2023-05-30 ENCOUNTER — Ambulatory Visit (INDEPENDENT_AMBULATORY_CARE_PROVIDER_SITE_OTHER): Payer: Medicare Other | Admitting: Vascular Surgery

## 2023-05-30 VITALS — BP 147/74 | HR 73 | Resp 18 | Ht 68.0 in | Wt 213.4 lb

## 2023-05-30 DIAGNOSIS — I70229 Atherosclerosis of native arteries of extremities with rest pain, unspecified extremity: Secondary | ICD-10-CM

## 2023-05-30 NOTE — Progress Notes (Signed)
Patient ID: GEN CLAGG, male   DOB: [birth year], 77 y.o.   MRN: [id number]  No chief complaint on file.   HPI Victor Rojas is a 77 y.o. male.    Procedure 03/20/2023: Bilateral common femoral, superficial femoral and profunda femoris endarterectomy with bovine pericardial patch angioplasty  Patient feels his legs are better he has no specific complaints   Past Medical History:  Diagnosis Date   Adenoma of colon    Arthritis    Bilateral carotid artery stenosis    Diverticulosis    GERD (gastroesophageal reflux disease)    Hiatal hernia    History of gout    History of kidney stones    Hyperlipemia    Hypertension    Paroxysmal SVT (supraventricular tachycardia)    Peripheral vascular disease (HCC)    TIA (transient ischemic attack)    Type 2 diabetes mellitus without complication (HCC)    Umbilical hernia     Past Surgical History:  Procedure Laterality Date   BILIARY DILATION  01/17/2023   Procedure: BILIARY DILATION;  Surgeon: Lynann Bologna, MD;  Location: [facility] ENDOSCOPY;  Service: Gastroenterology;;   CATARACT EXTRACTION, BILATERAL Bilateral    COLONOSCOPY     COLONOSCOPY WITH PROPOFOL N/A 06/18/2016   Procedure: COLONOSCOPY WITH PROPOFOL;  Surgeon: Christena Deem, MD;  Location: [facility] ENDOSCOPY;  Service: Endoscopy;  Laterality: N/A;   COLONOSCOPY WITH PROPOFOL N/A 01/09/2021   Procedure: COLONOSCOPY WITH PROPOFOL;  Surgeon: Regis Bill, MD;  Location: [facility] ENDOSCOPY;  Service: Endoscopy;  Laterality: N/A;   ENDARTERECTOMY Right 12/19/2018   Procedure: ENDARTERECTOMY CAROTID;  Surgeon: Renford Dills, MD;  Location: [facility] ORS;  Service: Vascular;  Laterality: Right;   ENDARTERECTOMY FEMORAL Bilateral 03/20/2023   Procedure: ENDARTERECTOMY FEMORAL  ( SFA STENT );  Surgeon: Renford Dills, MD;  Location: [facility] ORS;  Service: Vascular;  Laterality: Bilateral;   ENDOSCOPIC RETROGRADE CHOLANGIOPANCREATOGRAPHY (ERCP) WITH PROPOFOL N/A 01/17/2023    Procedure: ENDOSCOPIC RETROGRADE CHOLANGIOPANCREATOGRAPHY (ERCP) WITH PROPOFOL;  Surgeon: Lynann Bologna, MD;  Location: [facility] ENDOSCOPY;  Service: Gastroenterology;  Laterality: N/A;   ESOPHAGOGASTRODUODENOSCOPY (EGD) WITH PROPOFOL N/A 06/18/2016   Procedure: ESOPHAGOGASTRODUODENOSCOPY (EGD) WITH PROPOFOL;  Surgeon: Christena Deem, MD;  Location: [facility] ENDOSCOPY;  Service: Endoscopy;  Laterality: N/A;   ESOPHAGOGASTRODUODENOSCOPY (EGD) WITH PROPOFOL N/A 01/09/2021   Procedure: ESOPHAGOGASTRODUODENOSCOPY (EGD) WITH PROPOFOL;  Surgeon: Regis Bill, MD;  Location: [facility] ENDOSCOPY;  Service: Endoscopy;  Laterality: N/A;   LOWER EXTREMITY ANGIOGRAPHY Right 01/25/2023   Procedure: Lower Extremity Angiography;  Surgeon: Renford Dills, MD;  Location: [facility] INVASIVE CV LAB;  Service: Cardiovascular;  Laterality: Right;   REMOVAL OF STONES  01/17/2023   Procedure: REMOVAL OF STONES;  Surgeon: Lynann Bologna, MD;  Location: [facility] ENDOSCOPY;  Service: Gastroenterology;;   ROBOTIC ASSISTED LAPAROSCOPIC CHOLECYSTECTOMY  03/02/2022   SPHINCTEROTOMY  01/17/2023   Procedure: Dennison Mascot;  Surgeon: Lynann Bologna, MD;  Location: [facility] ENDOSCOPY;  Service: Gastroenterology;;   TONSILLECTOMY     UMBILICAL HERNIA REPAIR        No Known Allergies  Current Outpatient Medications  Medication Sig Dispense Refill   allopurinol (ZYLOPRIM) 300 MG tablet Take 300 mg by mouth daily.     amLODipine (NORVASC) 5 MG tablet Take 1 tablet (5 mg total) by mouth daily. 90 tablet 0   aspirin EC 81 MG tablet Take 81 mg by mouth daily.      atorvastatin (LIPITOR) 40 MG tablet Take 1 tablet (40 mg total) by  mouth daily.     Cholecalciferol (D3-1000) 25 MCG (1000 UT) tablet Take 1,000 Units by mouth daily.     clopidogrel (PLAVIX) 75 MG tablet Take 1 tablet (75 mg total) by mouth daily. 90 tablet 3   cyanocobalamin (VITAMIN B12) 1000 MCG tablet Take 1,000 mcg by mouth daily.     indomethacin (INDOCIN) 25 MG capsule Take  25 mg by mouth 2 (two) times daily as needed.     leptospermum manuka honey (MEDIHONEY) PSTE paste Apply 1 Application topically daily. 44 mL 0   lisinopril-hydrochlorothiazide (ZESTORETIC) 20-12.5 MG tablet Take 1 tablet by mouth 2 (two) times daily. (Patient taking differently: Take 1 tablet by mouth daily.)     metFORMIN (GLUCOPHAGE) 500 MG tablet Take 500 mg by mouth daily with breakfast.     metoprolol tartrate (LOPRESSOR) 25 MG tablet Take 25 mg by mouth daily.     Multiple Vitamin (MULTI-VITAMINS) TABS Take 1 tablet by mouth daily.      oxyCODONE-acetaminophen (PERCOCET) 5-325 MG tablet Take 1 tablet by mouth every 4 (four) hours as needed for severe pain. 30 tablet 0   pantoprazole (PROTONIX) 40 MG tablet Take 40 mg by mouth at bedtime.     sulfamethoxazole-trimethoprim (BACTRIM DS) 800-160 MG tablet Take 1 tablet by mouth 2 (two) times daily. 20 tablet 0   vitamin C (ASCORBIC ACID) 500 MG tablet Take 500 mg by mouth daily.     vitamin E 180 MG (400 UNITS) capsule Take 400 Units by mouth daily.     No current facility-administered medications for this visit.        Physical Exam There were no vitals taken for this visit. Gen:  WD/WN, NAD Skin: incision C/D/I     Assessment/Plan:  1. Atherosclerosis of artery of extremity with rest pain [facility]) Patient is doing well status post surgery he will follow-up in 3 months - VAS [place] ABI WITH/WO TBI; Future - VAS [place] LOWER EXTREMITY ARTERIAL DUPLEX; Future      [name] 05/30/2023, 1:03 PM   This note was created with Dragon medical transcription system.  Any errors from dictation are unintentional.

## 2023-06-01 ENCOUNTER — Encounter (INDEPENDENT_AMBULATORY_CARE_PROVIDER_SITE_OTHER): Payer: Self-pay | Admitting: Vascular Surgery

## 2023-08-25 NOTE — Progress Notes (Unsigned)
MRN : [id number]  Victor Rojas is a 77 y.o. (1946-03-22) male who presents with chief complaint of check circulation.  History of Present Illness:   The patient returns to the office for followup and review status post angiogram with intervention on 03/20/2023.   Procedure 03/20/2023: Bilateral common femoral, superficial femoral and profunda femoris endarterectomy with bovine pericardial patch angioplasty  The patient notes improvement in the lower extremity symptoms. No interval shortening of the patient's claudication distance or rest pain symptoms. No new ulcers or wounds have occurred since the last visit.  There have been no significant changes to the patient's overall health care.  No documented history of amaurosis fugax or recent TIA symptoms. There are no recent neurological changes noted. No documented history of DVT, PE or superficial thrombophlebitis. The patient denies recent episodes of angina or shortness of breath.   ABI's Rt=0.84 and Lt=0.85  (previous ABI's Rt=0.76 and Lt=1.00) Duplex US of the bilateral lower extremity arterial system shows common femoral artery appears widely patent as are the profunda femoris arteries bilaterally.  Again is noted the chronic SFA occlusions.  Tibial flow is identified and noted to be monophasic.  No outpatient medications have been marked as taking for the 08/26/23 encounter (Appointment) with Gilda Crease, Latina Craver, MD.    Past Medical History:  Diagnosis Date   Adenoma of colon    Arthritis    Bilateral carotid artery stenosis    Diverticulosis    GERD (gastroesophageal reflux disease)    Hiatal hernia    History of gout    History of kidney stones    Hyperlipemia    Hypertension    Paroxysmal SVT (supraventricular tachycardia)    Peripheral vascular disease (HCC)    TIA (transient ischemic attack)    Type 2 diabetes mellitus without complication  (HCC)    Umbilical hernia     Past Surgical History:  Procedure Laterality Date   BILIARY DILATION  01/17/2023   Procedure: BILIARY DILATION;  Surgeon: Lynann Bologna, MD;  Location: [facility] ENDOSCOPY;  Service: Gastroenterology;;   CATARACT EXTRACTION, BILATERAL Bilateral    COLONOSCOPY     COLONOSCOPY WITH PROPOFOL N/A 06/18/2016   Procedure: COLONOSCOPY WITH PROPOFOL;  Surgeon: Christena Deem, MD;  Location: [facility] ENDOSCOPY;  Service: Endoscopy;  Laterality: N/A;   COLONOSCOPY WITH PROPOFOL N/A 01/09/2021   Procedure: COLONOSCOPY WITH PROPOFOL;  Surgeon: Regis Bill, MD;  Location: [facility] ENDOSCOPY;  Service: Endoscopy;  Laterality: N/A;   ENDARTERECTOMY Right 12/19/2018   Procedure: ENDARTERECTOMY CAROTID;  Surgeon: Renford Dills, MD;  Location: [facility] ORS;  Service: Vascular;  Laterality: Right;   ENDARTERECTOMY FEMORAL Bilateral 03/20/2023   Procedure: ENDARTERECTOMY FEMORAL  ( SFA STENT );  Surgeon: Renford Dills, MD;  Location: [facility] ORS;  Service: Vascular;  Laterality: Bilateral;   ENDOSCOPIC RETROGRADE CHOLANGIOPANCREATOGRAPHY (ERCP) WITH PROPOFOL N/A 01/17/2023   Procedure: ENDOSCOPIC RETROGRADE CHOLANGIOPANCREATOGRAPHY (ERCP) WITH PROPOFOL;  Surgeon: Lynann Bologna, MD;  Location: [facility] ENDOSCOPY;  Service: Gastroenterology;  Laterality: N/A;   ESOPHAGOGASTRODUODENOSCOPY (EGD) WITH PROPOFOL N/A 06/18/2016   Procedure: ESOPHAGOGASTRODUODENOSCOPY (EGD) WITH PROPOFOL;  Surgeon:  Christena Deem, MD;  Location: [facility] ENDOSCOPY;  Service: Endoscopy;  Laterality: N/A;   ESOPHAGOGASTRODUODENOSCOPY (EGD) WITH PROPOFOL N/A 01/09/2021   Procedure: ESOPHAGOGASTRODUODENOSCOPY (EGD) WITH PROPOFOL;  Surgeon: Regis Bill, MD;  Location: [facility] ENDOSCOPY;  Service: Endoscopy;  Laterality: N/A;   LOWER EXTREMITY ANGIOGRAPHY Right 01/25/2023   Procedure: Lower Extremity Angiography;  Surgeon: Renford Dills, MD;  Location: [facility] INVASIVE CV LAB;  Service: Cardiovascular;  Laterality:  Right;   REMOVAL OF STONES  01/17/2023   Procedure: REMOVAL OF STONES;  Surgeon: Lynann Bologna, MD;  Location: [facility] ENDOSCOPY;  Service: Gastroenterology;;   ROBOTIC ASSISTED LAPAROSCOPIC CHOLECYSTECTOMY  03/02/2022   SPHINCTEROTOMY  01/17/2023   Procedure: Dennison Mascot;  Surgeon: Lynann Bologna, MD;  Location: [facility] ENDOSCOPY;  Service: Gastroenterology;;   TONSILLECTOMY     UMBILICAL HERNIA REPAIR      Social History Social History   Tobacco Use   Smoking status: Former    Current packs/day: 0.00    Types: Cigarettes    Quit date: 02/03/1989    Years since quitting: 34.5   Smokeless tobacco: Never  Vaping Use   Vaping status: Never Used  Substance Use Topics   Alcohol use: Not Currently    Alcohol/week: 6.0 standard drinks of alcohol    Types: 6 Cans of beer per week   Drug use: No    Family History Family History  Problem Relation Age of Onset   Cataracts Mother    Heart attack Mother    Cancer Sister     No Known Allergies   REVIEW OF SYSTEMS (Negative unless checked)  Constitutional: [] Weight loss  [] Fever  [] Chills Cardiac: [] Chest pain   [] Chest pressure   [] Palpitations   [] Shortness of breath when laying flat   [] Shortness of breath with exertion. Vascular:  [x] Pain in legs with walking   [] Pain in legs at rest  [] History of DVT   [] Phlebitis   [] Swelling in legs   [] Varicose veins   [] Non-healing ulcers Pulmonary:   [] Uses home oxygen   [] Productive cough   [] Hemoptysis   [] Wheeze  [] COPD   [] Asthma Neurologic:  [] Dizziness   [] Seizures   [] History of stroke   [] History of TIA  [] Aphasia   [] Vissual changes   [] Weakness or numbness in arm   [] Weakness or numbness in leg Musculoskeletal:   [] Joint swelling   [] Joint pain   [] Low back pain Hematologic:  [] Easy bruising  [] Easy bleeding   [] Hypercoagulable state   [] Anemic Gastrointestinal:  [] Diarrhea   [] Vomiting  [] Gastroesophageal reflux/heartburn   [] Difficulty swallowing. Genitourinary:  [] Chronic kidney  disease   [] Difficult urination  [] Frequent urination   [] Blood in urine Skin:  [] Rashes   [] Ulcers  Psychological:  [] History of anxiety   []  History of major depression.  Physical Examination  There were no vitals filed for this visit. There is no height or weight on file to calculate BMI. Gen: WD/WN, NAD Head: [place]/AT, No temporalis wasting.  Ear/Nose/Throat: Hearing grossly intact, nares w/o erythema or drainage Eyes: PER, EOMI, sclera nonicteric.  Neck: Supple, no masses.  No bruit or JVD.  Pulmonary:  Good air movement, no audible wheezing, no use of accessory muscles.  Cardiac: RRR, normal S1, S2, no Murmurs. Vascular:  mild trophic changes, no open wounds Vessel Right Left  Radial Palpable Palpable  PT Not Palpable Not Palpable  DP Not Palpable Not Palpable  Gastrointestinal: soft, non-distended. No guarding/no peritoneal signs.  Musculoskeletal: M/S 5/5 throughout.  No visible deformity.  Neurologic:  CN 2-12 intact. Pain and light touch intact in extremities.  Symmetrical.  Speech is fluent. Motor exam as listed above. Psychiatric: Judgment intact, Mood & affect appropriate for pt's clinical situation. Dermatologic: No rashes or ulcers noted.  No changes consistent with cellulitis.   CBC Lab Results  Component Value Date   WBC 10.4 03/21/2023   HGB 9.7 (L) 03/21/2023   HCT 29.5 (L) 03/21/2023   MCV 107.3 (H) 03/21/2023   PLT 120 (L) 03/21/2023    BMET    Component Value Date/Time   NA 139 03/21/2023 0018   K 5.0 03/21/2023 0018   CL 110 03/21/2023 0018   CO2 21 (L) 03/21/2023 0018   GLUCOSE 182 (H) 03/21/2023 0018   BUN 29 (H) 03/21/2023 0018   CREATININE 1.11 03/21/2023 0018   CALCIUM 7.7 (L) 03/21/2023 0018   GFRNONAA >60 03/21/2023 0018   GFRAA >60 12/20/2018 0429   CrCl cannot be calculated (Patient's most recent lab result is older than the maximum 21 days allowed.).  COAG Lab Results  Component Value Date   INR 1.1 01/27/2023   INR 1.6 (H)  01/17/2023   INR 1.7 (H) 01/17/2023    Radiology No results found.   Assessment/Plan 1. Atherosclerosis of artery of extremity with rest pain (HCC) Recommend:  The patient is status post successful femoral endarterectomies with aortoiliac intervention.  The patient reports that the claudication symptoms and leg pain has improved.   The patient denies lifestyle limiting changes at this point in time.  No further invasive studies, angiography or surgery at this time The patient should continue walking and begin a more formal exercise program.  The patient should continue antiplatelet therapy and aggressive treatment of the lipid abnormalities  Continued surveillance is indicated as atherosclerosis is likely to progress with time.    Patient should undergo noninvasive studies as ordered. The patient will follow up with me to review the studies.  - VAS [place] ABI WITH/WO TBI; Future  2. Bilateral carotid artery stenosis Recommend:   Given the patient's asymptomatic subcritical stenosis no further invasive testing or surgery at this time.   Duplex ultrasound shows 1-39% stenosis bilaterally.   Continue antiplatelet therapy as prescribed Continue management of CAD, HTN and Hyperlipidemia Healthy heart diet,  encouraged exercise at least 4 times per week Follow up in 24 months with duplex ultrasound and physical exam  3. Essential hypertension Continue antihypertensive medications as already ordered, these medications have been reviewed and there are no changes at this time.  4. Paroxysmal SVT (supraventricular tachycardia) Continue antiarrhythmia medications as already ordered, these medications have been reviewed and there are no changes at this time.  Continue anticoagulation as ordered by Cardiology Service  5. Controlled other specified diabetes mellitus with hyperglycemia, unspecified whether long term insulin use (HCC) Continue hypoglycemic medications as already ordered,  these medications have been reviewed and there are no changes at this time.  Hgb A1C to be monitored as already arranged by primary service  6. Hyperlipidemia, mixed Continue statin as ordered and reviewed, no changes at this time    [name], MD  08/25/2023 3:06 PM

## 2023-08-26 ENCOUNTER — Ambulatory Visit (INDEPENDENT_AMBULATORY_CARE_PROVIDER_SITE_OTHER): Payer: Medicare Other | Admitting: Vascular Surgery

## 2023-08-26 ENCOUNTER — Ambulatory Visit (INDEPENDENT_AMBULATORY_CARE_PROVIDER_SITE_OTHER): Payer: Medicare Other

## 2023-08-26 ENCOUNTER — Encounter (INDEPENDENT_AMBULATORY_CARE_PROVIDER_SITE_OTHER): Payer: Self-pay | Admitting: Vascular Surgery

## 2023-08-26 VITALS — BP 148/80 | HR 62 | Resp 16 | Wt 212.8 lb

## 2023-08-26 DIAGNOSIS — I70229 Atherosclerosis of native arteries of extremities with rest pain, unspecified extremity: Secondary | ICD-10-CM

## 2023-08-26 DIAGNOSIS — I471 Supraventricular tachycardia, unspecified: Secondary | ICD-10-CM

## 2023-08-26 DIAGNOSIS — I6523 Occlusion and stenosis of bilateral carotid arteries: Secondary | ICD-10-CM | POA: Diagnosis not present

## 2023-08-26 DIAGNOSIS — E782 Mixed hyperlipidemia: Secondary | ICD-10-CM

## 2023-08-26 DIAGNOSIS — I10 Essential (primary) hypertension: Secondary | ICD-10-CM

## 2023-08-26 DIAGNOSIS — E1365 Other specified diabetes mellitus with hyperglycemia: Secondary | ICD-10-CM

## 2023-08-26 LAB — VAS US ABI WITH/WO TBI
Left ABI: 0.85
Right ABI: 0.84

## 2024-02-28 ENCOUNTER — Other Ambulatory Visit (INDEPENDENT_AMBULATORY_CARE_PROVIDER_SITE_OTHER): Payer: Self-pay | Admitting: Vascular Surgery

## 2024-02-28 DIAGNOSIS — Z9889 Other specified postprocedural states: Secondary | ICD-10-CM

## 2024-02-28 DIAGNOSIS — I70209 Unspecified atherosclerosis of native arteries of extremities, unspecified extremity: Secondary | ICD-10-CM

## 2024-03-02 ENCOUNTER — Encounter (INDEPENDENT_AMBULATORY_CARE_PROVIDER_SITE_OTHER): Payer: Self-pay | Admitting: Nurse Practitioner

## 2024-03-02 ENCOUNTER — Ambulatory Visit (INDEPENDENT_AMBULATORY_CARE_PROVIDER_SITE_OTHER): Payer: Medicare Other | Admitting: Nurse Practitioner

## 2024-03-02 ENCOUNTER — Ambulatory Visit (INDEPENDENT_AMBULATORY_CARE_PROVIDER_SITE_OTHER): Payer: Medicare Other

## 2024-03-02 VITALS — BP 149/95 | HR 66 | Resp 18 | Ht 68.0 in | Wt 216.4 lb

## 2024-03-02 DIAGNOSIS — Z9889 Other specified postprocedural states: Secondary | ICD-10-CM

## 2024-03-02 DIAGNOSIS — I739 Peripheral vascular disease, unspecified: Secondary | ICD-10-CM

## 2024-03-02 DIAGNOSIS — E782 Mixed hyperlipidemia: Secondary | ICD-10-CM

## 2024-03-02 DIAGNOSIS — I6523 Occlusion and stenosis of bilateral carotid arteries: Secondary | ICD-10-CM | POA: Diagnosis not present

## 2024-03-02 DIAGNOSIS — I10 Essential (primary) hypertension: Secondary | ICD-10-CM

## 2024-03-02 DIAGNOSIS — I70209 Unspecified atherosclerosis of native arteries of extremities, unspecified extremity: Secondary | ICD-10-CM

## 2024-03-02 NOTE — Progress Notes (Signed)
 Subjective:    Patient ID: Victor Rojas, male    DOB: 1946-04-05, 78 y.o.   MRN: [id number] Chief Complaint  Patient presents with   Follow-up    -6 month follow up with ABI    The patient returns to the office for followup and review status post angiogram with intervention on 03/20/2023.    Procedure 03/20/2023: 1. Bilateral common femoral, superficial femoral and profunda femoris endarterectomy with bovine pericardial patch angioplasty   The patient notes improvement in the lower extremity symptoms. No interval shortening of the patient's claudication distance or rest pain symptoms. No new ulcers or wounds have occurred since the last visit.   There have been no significant changes to the patient's overall health care.  His largest complaint is of his neuropathy which has been bothering him for some years.   No documented history of amaurosis fugax or recent TIA symptoms. There are no recent neurological changes noted. No documented history of DVT, PE or superficial thrombophlebitis. The patient denies recent episodes of angina or shortness of breath.    ABI's Rt=0.84 and Lt=0.85  (previous ABI's Rt=0.84 and Lt=0.85) Duplex US of the bilateral lower extremity arterial system biphasic tibial waveforms bilaterally.  Previous studies that show known SFA occlusions bilaterally.       Review of Systems  Musculoskeletal:  Positive for gait problem.  Neurological:  Positive for numbness.  All other systems reviewed and are negative.      Objective:   Physical Exam Vitals reviewed.  HENT:     Head: Normocephalic.  Cardiovascular:     Rate and Rhythm: Normal rate.     Pulses:          Dorsalis pedis pulses are detected w/ Doppler on the right side and detected w/ Doppler on the left side.       Posterior tibial pulses are detected w/ Doppler on the right side and detected w/ Doppler on the left side.  Pulmonary:     Effort: Pulmonary effort is normal.  Skin:    General:  Skin is warm and dry.  Neurological:     Mental Status: He is alert and oriented to person, place, and time.     Gait: Gait abnormal.  Psychiatric:        Mood and Affect: Mood normal.        Behavior: Behavior normal.        Thought Content: Thought content normal.        Judgment: Judgment normal.     BP (!) 149/95   Pulse 66   Resp 18   Ht 5\' 8"  (1.727 m)   Wt 216 lb 6.4 oz (98.2 kg)   BMI 32.90 kg/m   Past Medical History:  Diagnosis Date   Adenoma of colon    Arthritis    Bilateral carotid artery stenosis    Diverticulosis    GERD (gastroesophageal reflux disease)    Hiatal hernia    History of gout    History of kidney stones    Hyperlipemia    Hypertension    Paroxysmal SVT (supraventricular tachycardia) (HCC)    Peripheral vascular disease (HCC)    TIA (transient ischemic attack)    Type 2 diabetes mellitus without complication (HCC)    Umbilical hernia     Social History   Socioeconomic History   Marital status: Widowed    Spouse name: Not on file   Number of children: Not on file   Years of  education: Not on file   Highest education level: Not on file  Occupational History   Not on file  Tobacco Use   Smoking status: Former    Current packs/day: 0.00    Types: Cigarettes    Quit date: 02/03/1989    Years since quitting: 35.0   Smokeless tobacco: Never  Vaping Use   Vaping status: Never Used  Substance and Sexual Activity   Alcohol use: Not Currently    Alcohol/week: 6.0 standard drinks of alcohol    Types: 6 Cans of beer per week   Drug use: No   Sexual activity: Not on file  Other Topics Concern   Not on file  Social History Narrative   Lives alone   Social Drivers of Health   Financial Resource Strain: Not on file  Food Insecurity: No Food Insecurity (03/20/2023)   Hunger Vital Sign    Worried About Running Out of Food in the Last Year: Never true    Ran Out of Food in the Last Year: Never true  Transportation Needs: No  Transportation Needs (03/20/2023)   PRAPARE - Administrator, Civil Service (Medical): No    Lack of Transportation (Non-Medical): No  Physical Activity: Not on file  Stress: Not on file  Social Connections: Not on file  Intimate Partner Violence: Not At Risk (03/20/2023)   Humiliation, Afraid, Rape, and Kick questionnaire    Fear of Current or Ex-Partner: No    Emotionally Abused: No    Physically Abused: No    Sexually Abused: No    Past Surgical History:  Procedure Laterality Date   BILIARY DILATION  01/17/2023   Procedure: BILIARY DILATION;  Surgeon: Lynann Bologna, MD;  Location: [facility] ENDOSCOPY;  Service: Gastroenterology;;   CATARACT EXTRACTION, BILATERAL Bilateral    COLONOSCOPY     COLONOSCOPY WITH PROPOFOL N/A 06/18/2016   Procedure: COLONOSCOPY WITH PROPOFOL;  Surgeon: Christena Deem, MD;  Location: [facility] ENDOSCOPY;  Service: Endoscopy;  Laterality: N/A;   COLONOSCOPY WITH PROPOFOL N/A 01/09/2021   Procedure: COLONOSCOPY WITH PROPOFOL;  Surgeon: Regis Bill, MD;  Location: [facility] ENDOSCOPY;  Service: Endoscopy;  Laterality: N/A;   ENDARTERECTOMY Right 12/19/2018   Procedure: ENDARTERECTOMY CAROTID;  Surgeon: Renford Dills, MD;  Location: [facility] ORS;  Service: Vascular;  Laterality: Right;   ENDARTERECTOMY FEMORAL Bilateral 03/20/2023   Procedure: ENDARTERECTOMY FEMORAL  ( SFA STENT );  Surgeon: Renford Dills, MD;  Location: [facility] ORS;  Service: Vascular;  Laterality: Bilateral;   ENDOSCOPIC RETROGRADE CHOLANGIOPANCREATOGRAPHY (ERCP) WITH PROPOFOL N/A 01/17/2023   Procedure: ENDOSCOPIC RETROGRADE CHOLANGIOPANCREATOGRAPHY (ERCP) WITH PROPOFOL;  Surgeon: Lynann Bologna, MD;  Location: [facility] ENDOSCOPY;  Service: Gastroenterology;  Laterality: N/A;   ESOPHAGOGASTRODUODENOSCOPY (EGD) WITH PROPOFOL N/A 06/18/2016   Procedure: ESOPHAGOGASTRODUODENOSCOPY (EGD) WITH PROPOFOL;  Surgeon: Christena Deem, MD;  Location: [facility] ENDOSCOPY;  Service: Endoscopy;  Laterality:  N/A;   ESOPHAGOGASTRODUODENOSCOPY (EGD) WITH PROPOFOL N/A 01/09/2021   Procedure: ESOPHAGOGASTRODUODENOSCOPY (EGD) WITH PROPOFOL;  Surgeon: Regis Bill, MD;  Location: [facility] ENDOSCOPY;  Service: Endoscopy;  Laterality: N/A;   LOWER EXTREMITY ANGIOGRAPHY Right 01/25/2023   Procedure: Lower Extremity Angiography;  Surgeon: Renford Dills, MD;  Location: [facility] INVASIVE CV LAB;  Service: Cardiovascular;  Laterality: Right;   REMOVAL OF STONES  01/17/2023   Procedure: REMOVAL OF STONES;  Surgeon: Lynann Bologna, MD;  Location: [facility] ENDOSCOPY;  Service: Gastroenterology;;   ROBOTIC ASSISTED LAPAROSCOPIC CHOLECYSTECTOMY  03/02/2022   SPHINCTEROTOMY  01/17/2023   Procedure: SPHINCTEROTOMY;  Surgeon: Lynann Bologna, MD;  Location: [facility] ENDOSCOPY;  Service: Gastroenterology;;   TONSILLECTOMY     UMBILICAL HERNIA REPAIR      Family History  Problem Relation Age of Onset   Cataracts Mother    Heart attack Mother    Cancer Sister     No Known Allergies     Latest Ref Rng & Units 03/21/2023   12:18 AM 03/20/2023    4:06 PM 03/18/2023    1:39 PM  CBC  WBC 4.0 - 10.5 K/uL 10.4  10.5  5.2   Hemoglobin 13.0 - 17.0 g/dL 9.7  40.9  81.1   Hematocrit 39.0 - 52.0 % 29.5  33.1  37.0   Platelets 150 - 400 K/uL 120  125  137       CMP     Component Value Date/Time   NA 139 03/21/2023 0018   K 5.0 03/21/2023 0018   CL 110 03/21/2023 0018   CO2 21 (L) 03/21/2023 0018   GLUCOSE 182 (H) 03/21/2023 0018   BUN 29 (H) 03/21/2023 0018   CREATININE 1.11 03/21/2023 0018   CALCIUM 7.7 (L) 03/21/2023 0018   PROT 6.3 (L) 01/21/2023 0549   ALBUMIN 2.8 (L) 01/21/2023 0549   AST 96 (H) 01/21/2023 0549   ALT 270 (H) 01/21/2023 0549   ALKPHOS 236 (H) 01/21/2023 0549   BILITOT 2.4 (H) 01/21/2023 0549   GFRNONAA >60 03/21/2023 0018     No results found.     Assessment & Plan:   1. Peripheral arterial disease with history of revascularization (HCC) (Primary)  Recommend:  The patient has evidence  of atherosclerosis of the lower extremities with claudication.  The patient does not voice lifestyle limiting changes at this point in time.  Noninvasive studies do not suggest clinically significant change.  No invasive studies, angiography or surgery at this time The patient should continue walking and begin a more formal exercise program.  The patient should continue antiplatelet therapy and aggressive treatment of the lipid abnormalities  No changes in the patient's medications at this time  Continued surveillance is indicated as atherosclerosis is likely to progress with time.    The patient will continue follow up with noninvasive studies as ordered.   2. Essential hypertension Continue antihypertensive medications as already ordered, these medications have been reviewed and there are no changes at this time.  3. Hyperlipidemia, mixed Continue statin as ordered and reviewed, no changes at this time   4. Bilateral carotid artery stenosis The patient has a history of carotid intervention.  We will have him get carotid duplexes at his return.   Current Outpatient Medications on File Prior to Visit  Medication Sig Dispense Refill   allopurinol (ZYLOPRIM) 300 MG tablet Take 300 mg by mouth daily.     amLODipine (NORVASC) 5 MG tablet Take 1 tablet (5 mg total) by mouth daily. 90 tablet 0   aspirin EC 81 MG tablet Take 81 mg by mouth daily.      atorvastatin (LIPITOR) 40 MG tablet Take 1 tablet (40 mg total) by mouth daily.     Cholecalciferol (D3-1000) 25 MCG (1000 UT) tablet Take 1,000 Units by mouth daily.     clopidogrel (PLAVIX) 75 MG tablet Take 1 tablet (75 mg total) by mouth daily. 90 tablet 3   cyanocobalamin (VITAMIN B12) 1000 MCG tablet Take 1,000 mcg by mouth daily.     indomethacin (INDOCIN) 25 MG capsule Take 25 mg by mouth 2 (two) times daily as  needed.     lisinopril-hydrochlorothiazide (ZESTORETIC) 20-12.5 MG tablet Take 1 tablet by mouth 2 (two) times daily.  (Patient taking differently: Take 1 tablet by mouth daily.)     metFORMIN (GLUCOPHAGE) 500 MG tablet Take 500 mg by mouth daily with breakfast.     metoprolol tartrate (LOPRESSOR) 25 MG tablet Take 25 mg by mouth daily.     Multiple Vitamin (MULTI-VITAMINS) TABS Take 1 tablet by mouth daily.      vitamin C (ASCORBIC ACID) 500 MG tablet Take 500 mg by mouth daily.     vitamin E 180 MG (400 UNITS) capsule Take 400 Units by mouth daily.     leptospermum manuka honey (MEDIHONEY) PSTE paste Apply 1 Application topically daily. (Patient not taking: Reported on 03/02/2024) 44 mL 0   oxyCODONE-acetaminophen (PERCOCET) 5-325 MG tablet Take 1 tablet by mouth every 4 (four) hours as needed for severe pain. (Patient not taking: Reported on 03/02/2024) 30 tablet 0   pantoprazole (PROTONIX) 40 MG tablet Take 40 mg by mouth at bedtime.     sulfamethoxazole-trimethoprim (BACTRIM DS) 800-160 MG tablet Take 1 tablet by mouth 2 (two) times daily. (Patient not taking: Reported on 03/02/2024) 20 tablet 0   No current facility-administered medications on file prior to visit.    There are no Patient Instructions on file for this visit. No follow-ups on file.   Georgiana Spinner, NP

## 2024-03-09 LAB — VAS US ABI WITH/WO TBI
Left ABI: 0.85
Right ABI: 0.84

## 2024-07-02 ENCOUNTER — Encounter: Payer: Self-pay | Admitting: *Deleted

## 2024-07-13 ENCOUNTER — Encounter: Payer: Self-pay | Admitting: *Deleted

## 2024-07-14 ENCOUNTER — Ambulatory Visit: Admitting: Anesthesiology

## 2024-07-14 ENCOUNTER — Ambulatory Visit
Admission: RE | Admit: 2024-07-14 | Discharge: 2024-07-14 | Disposition: A | Discharge status: Home / Self Care | Attending: Gastroenterology | Admitting: Gastroenterology

## 2024-07-14 ENCOUNTER — Encounter
Admission: RE | Disposition: A | Discharge status: Home / Self Care | Payer: Self-pay | Source: Home / Self Care | Attending: Gastroenterology

## 2024-07-14 ENCOUNTER — Other Ambulatory Visit: Payer: Self-pay

## 2024-07-14 ENCOUNTER — Encounter: Payer: Self-pay | Admitting: Gastroenterology

## 2024-07-14 DIAGNOSIS — Z6831 Body mass index (BMI) 31.0-31.9, adult: Secondary | ICD-10-CM | POA: Diagnosis not present

## 2024-07-14 DIAGNOSIS — E1151 Type 2 diabetes mellitus with diabetic peripheral angiopathy without gangrene: Secondary | ICD-10-CM | POA: Diagnosis not present

## 2024-07-14 DIAGNOSIS — E669 Obesity, unspecified: Secondary | ICD-10-CM | POA: Insufficient documentation

## 2024-07-14 DIAGNOSIS — K635 Polyp of colon: Secondary | ICD-10-CM | POA: Diagnosis not present

## 2024-07-14 DIAGNOSIS — K640 First degree hemorrhoids: Secondary | ICD-10-CM | POA: Diagnosis not present

## 2024-07-14 DIAGNOSIS — Z79899 Other long term (current) drug therapy: Secondary | ICD-10-CM | POA: Insufficient documentation

## 2024-07-14 DIAGNOSIS — G709 Myoneural disorder, unspecified: Secondary | ICD-10-CM | POA: Diagnosis not present

## 2024-07-14 DIAGNOSIS — E1122 Type 2 diabetes mellitus with diabetic chronic kidney disease: Secondary | ICD-10-CM | POA: Insufficient documentation

## 2024-07-14 DIAGNOSIS — Z860101 Personal history of adenomatous and serrated colon polyps: Secondary | ICD-10-CM | POA: Diagnosis present

## 2024-07-14 DIAGNOSIS — D128 Benign neoplasm of rectum: Secondary | ICD-10-CM | POA: Insufficient documentation

## 2024-07-14 DIAGNOSIS — N189 Chronic kidney disease, unspecified: Secondary | ICD-10-CM | POA: Diagnosis not present

## 2024-07-14 DIAGNOSIS — E785 Hyperlipidemia, unspecified: Secondary | ICD-10-CM | POA: Insufficient documentation

## 2024-07-14 DIAGNOSIS — Z7902 Long term (current) use of antithrombotics/antiplatelets: Secondary | ICD-10-CM | POA: Insufficient documentation

## 2024-07-14 DIAGNOSIS — Z7984 Long term (current) use of oral hypoglycemic drugs: Secondary | ICD-10-CM | POA: Diagnosis not present

## 2024-07-14 DIAGNOSIS — I129 Hypertensive chronic kidney disease with stage 1 through stage 4 chronic kidney disease, or unspecified chronic kidney disease: Secondary | ICD-10-CM | POA: Insufficient documentation

## 2024-07-14 DIAGNOSIS — K219 Gastro-esophageal reflux disease without esophagitis: Secondary | ICD-10-CM | POA: Diagnosis not present

## 2024-07-14 DIAGNOSIS — K449 Diaphragmatic hernia without obstruction or gangrene: Secondary | ICD-10-CM | POA: Insufficient documentation

## 2024-07-14 DIAGNOSIS — Z8673 Personal history of transient ischemic attack (TIA), and cerebral infarction without residual deficits: Secondary | ICD-10-CM | POA: Insufficient documentation

## 2024-07-14 DIAGNOSIS — Z1211 Encounter for screening for malignant neoplasm of colon: Secondary | ICD-10-CM | POA: Diagnosis not present

## 2024-07-14 DIAGNOSIS — Z9049 Acquired absence of other specified parts of digestive tract: Secondary | ICD-10-CM | POA: Diagnosis not present

## 2024-07-14 DIAGNOSIS — Z87891 Personal history of nicotine dependence: Secondary | ICD-10-CM | POA: Diagnosis not present

## 2024-07-14 DIAGNOSIS — D122 Benign neoplasm of ascending colon: Secondary | ICD-10-CM | POA: Diagnosis not present

## 2024-07-14 HISTORY — DX: Psoriasis, unspecified: L40.9

## 2024-07-14 HISTORY — DX: Personal history of transient ischemic attack (TIA), and cerebral infarction without residual deficits: Z86.73

## 2024-07-14 HISTORY — DX: Other primary thrombocytopenia: D69.49

## 2024-07-14 HISTORY — PX: COLONOSCOPY: SHX5424

## 2024-07-14 HISTORY — DX: Chronic kidney disease, unspecified: N18.9

## 2024-07-14 HISTORY — PX: POLYPECTOMY: SHX149

## 2024-07-14 LAB — GLUCOSE, CAPILLARY: Glucose-Capillary: 133 mg/dL — ABNORMAL HIGH (ref 70–99)

## 2024-07-14 SURGERY — COLONOSCOPY
Anesthesia: General

## 2024-07-14 MED ORDER — PROPOFOL 500 MG/50ML IV EMUL
INTRAVENOUS | Status: DC | PRN
  Administered 2024-07-14: 150 ug/kg/min via INTRAVENOUS

## 2024-07-14 MED ORDER — PROPOFOL 10 MG/ML IV BOLUS
INTRAVENOUS | Status: AC
  Filled 2024-07-14: qty 40

## 2024-07-14 MED ORDER — SODIUM CHLORIDE 0.9 % IV SOLN: INTRAVENOUS | Status: DC

## 2024-07-14 MED ORDER — PROPOFOL 10 MG/ML IV BOLUS
INTRAVENOUS | Status: DC | PRN
  Administered 2024-07-14: 50 mg via INTRAVENOUS

## 2024-07-14 NOTE — Anesthesia Preprocedure Evaluation (Signed)
 Anesthesia Evaluation  Patient identified by MRN, date of birth, ID band Patient awake    Reviewed: Allergy & Precautions, H&P , NPO status , Patient's Chart, lab work & pertinent test results, reviewed documented beta blocker date and time   Airway Mallampati: II   Neck ROM: full    Dental  (+) Poor Dentition   Pulmonary neg pulmonary ROS, former smoker   Pulmonary exam normal        Cardiovascular Exercise Tolerance: Poor hypertension, On Medications + Peripheral Vascular Disease  Normal cardiovascular exam Rhythm:regular Rate:Normal     Neuro/Psych TIA Neuromuscular disease  negative psych ROS   GI/Hepatic Neg liver ROS, hiatal hernia,GERD  Medicated,,  Endo/Other  negative endocrine ROSdiabetes    Renal/GU Renal disease  negative genitourinary   Musculoskeletal   Abdominal   Peds  Hematology negative hematology ROS (+)   Anesthesia Other Findings Past Medical History: No date: Adenoma of colon No date: Arthritis No date: Bilateral carotid artery stenosis No date: Chronic kidney disease No date: Diverticulosis No date: GERD (gastroesophageal reflux disease) No date: Hiatal hernia No date: History of gout No date: History of gout No date: History of kidney stones No date: History of TIA (transient ischemic attack) No date: Hyperlipemia No date: Hypertension No date: Paroxysmal SVT (supraventricular tachycardia) (HCC) No date: Peripheral vascular disease (HCC) No date: Primary thrombocytopenia (HCC) No date: Psoriasis No date: TIA (transient ischemic attack) No date: Type 2 diabetes mellitus without complication (HCC) No date: Umbilical hernia Past Surgical History: 01/17/2023: BILIARY DILATION     Comment:  Procedure: BILIARY DILATION;  Surgeon: Charlanne Groom,               MD;  Location: [facility] ENDOSCOPY;  Service: Gastroenterology;; No date: CATARACT EXTRACTION, BILATERAL; Bilateral No date:  CHOLECYSTECTOMY No date: COLONOSCOPY 06/18/2016: COLONOSCOPY WITH PROPOFOL ; N/A     Comment:  Procedure: COLONOSCOPY WITH PROPOFOL ;  Surgeon: Gladis RAYMOND Mariner, MD;  Location: [facility] ENDOSCOPY;  Service:               Endoscopy;  Laterality: N/A; 01/09/2021: COLONOSCOPY WITH PROPOFOL ; N/A     Comment:  Procedure: COLONOSCOPY WITH PROPOFOL ;  Surgeon:               Maryruth Ole DASEN, MD;  Location: [facility] ENDOSCOPY;                Service: Endoscopy;  Laterality: N/A; 12/19/2018: ENDARTERECTOMY; Right     Comment:  Procedure: ENDARTERECTOMY CAROTID;  Surgeon: Jama Cordella MATSU, MD;  Location: [facility] ORS;  Service: Vascular;                Laterality: Right; 03/20/2023: ENDARTERECTOMY FEMORAL; Bilateral     Comment:  Procedure: ENDARTERECTOMY FEMORAL  ( SFA STENT );                Surgeon: Jama Cordella MATSU, MD;  Location: [facility] ORS;                Service: Vascular;  Laterality: Bilateral; 01/17/2023: ENDOSCOPIC RETROGRADE CHOLANGIOPANCREATOGRAPHY (ERCP)  WITH PROPOFOL ; N/A     Comment:  Procedure: ENDOSCOPIC RETROGRADE               CHOLANGIOPANCREATOGRAPHY (ERCP) WITH PROPOFOL ;  Surgeon:               Charlanne,  Lynnie, MD;  Location: [facility] ENDOSCOPY;  Service:               Gastroenterology;  Laterality: N/A; 06/18/2016: ESOPHAGOGASTRODUODENOSCOPY (EGD) WITH PROPOFOL ; N/A     Comment:  Procedure: ESOPHAGOGASTRODUODENOSCOPY (EGD) WITH               PROPOFOL ;  Surgeon: Gladis RAYMOND Mariner, MD;  Location:               [facility] ENDOSCOPY;  Service: Endoscopy;  Laterality: N/A; 01/09/2021: ESOPHAGOGASTRODUODENOSCOPY (EGD) WITH PROPOFOL ; N/A     Comment:  Procedure: ESOPHAGOGASTRODUODENOSCOPY (EGD) WITH               PROPOFOL ;  Surgeon: Maryruth Ole DASEN, MD;  Location:               [facility] ENDOSCOPY;  Service: Endoscopy;  Laterality: N/A; No date: HERNIA REPAIR 01/25/2023: LOWER EXTREMITY ANGIOGRAPHY; Right     Comment:  Procedure: Lower Extremity Angiography;  Surgeon:                Jama Cordella MATSU, MD;  Location: [facility] INVASIVE CV LAB;               Service: Cardiovascular;  Laterality: Right; 01/17/2023: REMOVAL OF STONES     Comment:  Procedure: REMOVAL OF STONES;  Surgeon: Charlanne Lynnie,               MD;  Location: [facility] ENDOSCOPY;  Service: Gastroenterology;; 03/02/2022: ROBOTIC ASSISTED LAPAROSCOPIC CHOLECYSTECTOMY 01/17/2023: SPHINCTEROTOMY     Comment:  Procedure: SPHINCTEROTOMY;  Surgeon: Charlanne Lynnie, MD;               Location: [facility] ENDOSCOPY;  Service: Gastroenterology;; No date: TONSILLECTOMY No date: UMBILICAL HERNIA REPAIR BMI    Body Mass Index: 31.17 kg/m     Reproductive/Obstetrics negative OB ROS                              Anesthesia Physical Anesthesia Plan  ASA: 3  Anesthesia Plan: General   Post-op Pain Management:    Induction:   PONV Risk Score and Plan:   Airway Management Planned:   Additional Equipment:   Intra-op Plan:   Post-operative Plan:   Informed Consent: I have reviewed the patients History and Physical, chart, labs and discussed the procedure including the risks, benefits and alternatives for the proposed anesthesia with the patient or authorized representative who has indicated his/her understanding and acceptance.     Dental Advisory Given  Plan Discussed with: CRNA  Anesthesia Plan Comments:         Anesthesia Quick Evaluation

## 2024-07-14 NOTE — Op Note (Signed)
 [facility] Gastroenterology Patient Name: Victor Rojas Procedure Date: 07/14/2024 10:24 AM MRN: [id number] Account #: [id number] Date of Birth: [birth year] Admit Type: Outpatient Age: 78 Room: [facility] ENDO ROOM 1 Gender: Male Note Status: Finalized Instrument Name: [id number] Procedure:             Colonoscopy Indications:           Surveillance: Personal history of adenomatous polyps                         on last colonoscopy 3 years ago Providers:             Ole Schick MD, MD Referring MD:          Norleen CHARM Rower, MD (Referring MD) Medicines:             Monitored Anesthesia Care Complications:         No immediate complications. Estimated blood loss:                         Minimal. Procedure:             Pre-Anesthesia Assessment:                        - Prior to the procedure, a History and Physical was                         performed, and patient medications and allergies were                         reviewed. The patient is competent. The risks and                         benefits of the procedure and the sedation options and                         risks were discussed with the patient. All questions                         were answered and informed consent was obtained.                         Patient identification and proposed procedure were                         verified by the physician, the nurse, the                         anesthesiologist, the anesthetist and the technician                         in the endoscopy suite. Mental Status Examination:                         alert and oriented. Airway Examination: normal                         oropharyngeal airway and neck mobility. Respiratory  Examination: clear to auscultation. CV Examination:                         normal. Prophylactic Antibiotics: The patient does not                         require prophylactic antibiotics. Prior                          Anticoagulants: The patient has taken Plavix                          (clopidogrel ), last dose was 5 days prior to                         procedure. ASA Grade Assessment: III - A patient with                         severe systemic disease. After reviewing the risks and                         benefits, the patient was deemed in satisfactory                         condition to undergo the procedure. The anesthesia                         plan was to use monitored anesthesia care (MAC).                         Immediately prior to administration of medications,                         the patient was re-assessed for adequacy to receive                         sedatives. The heart rate, respiratory rate, oxygen                         saturations, blood pressure, adequacy of pulmonary                         ventilation, and response to care were monitored                         throughout the procedure. The physical status of the                         patient was re-assessed after the procedure.                        After obtaining informed consent, the colonoscope was                         passed under direct vision. Throughout the procedure,                         the patient's blood pressure, pulse, and oxygen  saturations were monitored continuously. The                         Colonoscope was introduced through the anus and                         advanced to the the cecum, identified by appendiceal                         orifice and ileocecal valve. The colonoscopy was                         performed without difficulty. The patient tolerated                         the procedure well. The quality of the bowel                         preparation was adequate to identify polyps. The                         ileocecal valve, appendiceal orifice, and rectum were                         photographed. Findings:      The perianal and digital rectal  examinations were normal.      Two sessile polyps were found in the ascending colon. The polyps were 3       to 5 mm in size. These polyps were removed with a cold snare. Resection       and retrieval were complete. Estimated blood loss was minimal.      A 3 mm polyp was found in the transverse colon. The polyp was sessile.       The polyp was removed with a cold snare. Resection and retrieval were       complete. Estimated blood loss was minimal.      A 3 mm polyp was found in the rectum. The polyp was sessile. The polyp       was removed with a cold snare. Resection and retrieval were complete.       Purastat was applied to rectal/anus lesion due to excessive oozing.      Internal hemorrhoids were found during retroflexion. The hemorrhoids       were Grade I (internal hemorrhoids that do not prolapse). Impression:            - Two 3 to 5 mm polyps in the ascending colon, removed                         with a cold snare. Resected and retrieved.                        - One 3 mm polyp in the transverse colon, removed with                         a cold snare. Resected and retrieved.                        - One 3 mm polyp in the rectum, removed with a cold  snare. Resected and retrieved.                        - Internal hemorrhoids. Recommendation:        - Repeat colonoscopy in 3 - 5 years for surveillance.                        - Return to referring physician as previously                         scheduled.                        - Resume Plavix  (clopidogrel ) at prior dose tomorrow.                        - Resume previous diet.                        - Discharge patient to home. Procedure Code(s):     --- Professional ---                        [phone], Colonoscopy, flexible; with removal of                         tumor(s), polyp(s), or other lesion(s) by snare                         technique Diagnosis Code(s):     --- Professional ---                         Z86.010, Personal history of colonic polyps                        D12.2, Benign neoplasm of ascending colon                        D12.3, Benign neoplasm of transverse colon (hepatic                         flexure or splenic flexure)                        D12.8, Benign neoplasm of rectum                        K64.0, First degree hemorrhoids CPT copyright 2022 [employer]. All rights reserved. The codes documented in this report are preliminary and upon coder review may  be revised to meet current compliance requirements. Ole Schick MD, MD 07/14/2024 10:59:55 AM Number of Addenda: 0 Note Initiated On: 07/14/2024 10:24 AM Scope Withdrawal Time: 0 hours 15 minutes 59 seconds  Total Procedure Duration: 0 hours 18 minutes 57 seconds  Estimated Blood Loss:  Estimated blood loss was minimal.      [facility]

## 2024-07-14 NOTE — Interval H&P Note (Signed)
 History and Physical Interval Note:  07/14/2024 10:24 AM  Victor Rojas  has presented today for surgery, with the diagnosis of HX OF ADENOMATOUS POLYP OF COLON.  The various methods of treatment have been discussed with the patient and family. After consideration of risks, benefits and other options for treatment, the patient has consented to  Procedure(s): COLONOSCOPY (N/A) as a surgical intervention.  The patient's history has been reviewed, patient examined, no change in status, stable for surgery.  I have reviewed the patient's chart and labs.  Questions were answered to the patient's satisfaction.     Victor Rojas  Ok to proceed with colonoscopy

## 2024-07-14 NOTE — Transfer of Care (Signed)
 Immediate Anesthesia Transfer of Care Note  Patient: Victor Rojas  Procedure(s) Performed: COLONOSCOPY POLYPECTOMY, INTESTINE  Patient Location: PACU and Endoscopy Unit  Anesthesia Type:MAC  Level of Consciousness: drowsy  Airway & Oxygen Therapy: Patient Spontanous Breathing  Post-op Assessment: Report given to RN and Post -op Vital signs reviewed and stable  Post vital signs: Reviewed and stable  Last Vitals:  Vitals Value Taken Time  BP 126/66 07/14/24 10:55  Temp    Pulse 65 07/14/24 10:55  Resp 14 07/14/24 10:55  SpO2 100 % 07/14/24 10:55  Vitals shown include unfiled device data.  Last Pain:  Vitals:   07/14/24 1055  TempSrc:   PainSc: 0-No pain         Complications: No notable events documented.

## 2024-07-14 NOTE — H&P (Signed)
 Outpatient short stay form Pre-procedure 07/14/2024  Victor ONEIDA Schick, MD  Primary Physician: Rudolpho Norleen BIRCH, MD  Reason for visit:  Surveillance  History of present illness:    78 y/o gentleman with history of hypertension, obesity, PVD on plavix  (last dose 5 days ago), and HLD here for surveillance colonoscopy. Last colonoscopy in 2022 with several small Ta's. History of cholecystectomy. No family history of GI malignancies.    Current Facility-Administered Medications:    0.9 %  sodium chloride  infusion, , Intravenous, Continuous, Gaylan Fauver, Victor ONEIDA, MD, Last Rate: 20 mL/hr at 07/14/24 0916, New Bag at 07/14/24 0916  Medications Prior to Admission  Medication Sig Dispense Refill Last Dose/Taking   allopurinol  (ZYLOPRIM ) 300 MG tablet Take 300 mg by mouth daily.   Past Week   amLODipine  (NORVASC ) 5 MG tablet Take 1 tablet (5 mg total) by mouth daily. 90 tablet 0 07/14/2024 Morning   aspirin  EC 81 MG tablet Take 81 mg by mouth daily.    Past Week   atorvastatin  (LIPITOR) 40 MG tablet Take 1 tablet (40 mg total) by mouth daily.   Past Week   Cholecalciferol  (D3-1000) 25 MCG (1000 UT) tablet Take 1,000 Units by mouth daily.   Past Week   cyanocobalamin  (VITAMIN B12) 1000 MCG tablet Take 1,000 mcg by mouth daily.   Past Week   lisinopril -hydrochlorothiazide  (ZESTORETIC ) 20-12.5 MG tablet Take 1 tablet by mouth 2 (two) times daily. (Patient taking differently: Take 1 tablet by mouth daily.)   07/14/2024 Morning   Multiple Vitamin (MULTI-VITAMINS) TABS Take 1 tablet by mouth daily.    Past Week   vitamin C  (ASCORBIC ACID ) 500 MG tablet Take 500 mg by mouth daily.   Past Week   vitamin E 180 MG (400 UNITS) capsule Take 400 Units by mouth daily.   Past Week   clopidogrel  (PLAVIX ) 75 MG tablet Take 1 tablet (75 mg total) by mouth daily. 90 tablet 3 07/08/2024   indomethacin  (INDOCIN ) 25 MG capsule Take 25 mg by mouth 2 (two) times daily as needed.      leptospermum manuka honey (MEDIHONEY) PSTE  paste Apply 1 Application topically daily. (Patient not taking: Reported on 03/02/2024) 44 mL 0    metFORMIN (GLUCOPHAGE) 500 MG tablet Take 500 mg by mouth daily with breakfast.   07/09/2024   metoprolol  tartrate (LOPRESSOR ) 25 MG tablet Take 25 mg by mouth daily.      pantoprazole  (PROTONIX ) 40 MG tablet Take 40 mg by mouth at bedtime.      sulfamethoxazole -trimethoprim  (BACTRIM  DS) 800-160 MG tablet Take 1 tablet by mouth 2 (two) times daily. (Patient not taking: Reported on 03/02/2024) 20 tablet 0      No Known Allergies   Past Medical History:  Diagnosis Date   Adenoma of colon    Arthritis    Bilateral carotid artery stenosis    Chronic kidney disease    Diverticulosis    GERD (gastroesophageal reflux disease)    Hiatal hernia    History of gout    History of gout    History of kidney stones    History of TIA (transient ischemic attack)    Hyperlipemia    Hypertension    Paroxysmal SVT (supraventricular tachycardia) (HCC)    Peripheral vascular disease (HCC)    Primary thrombocytopenia (HCC)    Psoriasis    TIA (transient ischemic attack)    Type 2 diabetes mellitus without complication (HCC)    Umbilical hernia     Review of  systems:  Otherwise negative.    Physical Exam  Gen: Alert, oriented. Appears stated age.  HEENT: PERRLA. Lungs: No respiratory distress CV: RRR Abd: soft, benign, no masses Ext: No edema    Planned procedures: Proceed with colonoscopy. The patient understands the nature of the planned procedure, indications, risks, alternatives and potential complications including but not limited to bleeding, infection, perforation, damage to internal organs and possible oversedation/side effects from anesthesia. The patient agrees and gives consent to proceed.  Please refer to procedure notes for findings, recommendations and patient disposition/instructions.     Victor ONEIDA Schick, MD [facility] Gastroenterology

## 2024-07-14 NOTE — Anesthesia Postprocedure Evaluation (Signed)
 Anesthesia Post Note  Patient: Victor Rojas  Procedure(s) Performed: COLONOSCOPY POLYPECTOMY, INTESTINE  Patient location during evaluation: PACU Anesthesia Type: General Level of consciousness: awake and alert Pain management: pain level controlled Vital Signs Assessment: post-procedure vital signs reviewed and stable Respiratory status: spontaneous breathing, nonlabored ventilation, respiratory function stable and patient connected to nasal cannula oxygen Cardiovascular status: blood pressure returned to baseline and stable Postop Assessment: no apparent nausea or vomiting Anesthetic complications: no   No notable events documented.   Last Vitals:  Vitals:   07/14/24 1120 07/14/24 1134  BP:  (!) 152/68  Pulse:    Resp:  18  Temp:    SpO2: 99% 96%    Last Pain:  Vitals:   07/14/24 1120  TempSrc:   PainSc: 0-No pain                 Lynwood KANDICE Clause

## 2024-07-15 LAB — SURGICAL PATHOLOGY

## 2024-08-28 ENCOUNTER — Other Ambulatory Visit (INDEPENDENT_AMBULATORY_CARE_PROVIDER_SITE_OTHER): Payer: Self-pay | Admitting: Nurse Practitioner

## 2024-08-28 DIAGNOSIS — I6523 Occlusion and stenosis of bilateral carotid arteries: Secondary | ICD-10-CM

## 2024-08-28 DIAGNOSIS — Z9889 Other specified postprocedural states: Secondary | ICD-10-CM

## 2024-08-31 ENCOUNTER — Ambulatory Visit (INDEPENDENT_AMBULATORY_CARE_PROVIDER_SITE_OTHER)

## 2024-08-31 ENCOUNTER — Encounter (INDEPENDENT_AMBULATORY_CARE_PROVIDER_SITE_OTHER): Admitting: Nurse Practitioner

## 2024-08-31 ENCOUNTER — Encounter (INDEPENDENT_AMBULATORY_CARE_PROVIDER_SITE_OTHER): Payer: Self-pay | Admitting: Nurse Practitioner

## 2024-08-31 DIAGNOSIS — I6523 Occlusion and stenosis of bilateral carotid arteries: Secondary | ICD-10-CM | POA: Diagnosis not present

## 2024-08-31 DIAGNOSIS — Z9889 Other specified postprocedural states: Secondary | ICD-10-CM

## 2024-08-31 DIAGNOSIS — I739 Peripheral vascular disease, unspecified: Secondary | ICD-10-CM | POA: Diagnosis not present

## 2024-09-03 ENCOUNTER — Ambulatory Visit (INDEPENDENT_AMBULATORY_CARE_PROVIDER_SITE_OTHER): Admitting: Vascular Surgery

## 2024-09-04 LAB — VAS US ABI WITH/WO TBI
Left ABI: 0.85
Right ABI: 0.92

## 2024-09-10 ENCOUNTER — Encounter (INDEPENDENT_AMBULATORY_CARE_PROVIDER_SITE_OTHER): Payer: Self-pay | Admitting: Vascular Surgery

## 2024-09-10 ENCOUNTER — Ambulatory Visit (INDEPENDENT_AMBULATORY_CARE_PROVIDER_SITE_OTHER): Admitting: Vascular Surgery

## 2024-09-10 VITALS — BP 155/80 | HR 64 | Resp 18 | Ht 68.0 in | Wt 209.4 lb

## 2024-09-10 DIAGNOSIS — E782 Mixed hyperlipidemia: Secondary | ICD-10-CM

## 2024-09-10 DIAGNOSIS — I10 Essential (primary) hypertension: Secondary | ICD-10-CM | POA: Diagnosis not present

## 2024-09-10 DIAGNOSIS — Z9889 Other specified postprocedural states: Secondary | ICD-10-CM

## 2024-09-10 DIAGNOSIS — I739 Peripheral vascular disease, unspecified: Secondary | ICD-10-CM | POA: Diagnosis not present

## 2024-09-10 DIAGNOSIS — I6523 Occlusion and stenosis of bilateral carotid arteries: Secondary | ICD-10-CM | POA: Diagnosis not present

## 2024-09-14 ENCOUNTER — Encounter (INDEPENDENT_AMBULATORY_CARE_PROVIDER_SITE_OTHER): Payer: Self-pay | Admitting: Vascular Surgery

## 2024-09-14 NOTE — Progress Notes (Signed)
 Subjective:    Patient ID: Victor Rojas, male    DOB: December 22, 1945, 78 y.o.   MRN: [id number] Chief Complaint  Patient presents with   Follow-up    6 month follow up ABI + Carotid     The patient returns to the office for followup and review status post angiogram with intervention on 03/20/2023.    Procedure 03/20/2023: 1.Bilateral common femoral, superficial femoral and profunda femoris endarterectomy with bovine pericardial patch angioplasty   The patient notes improvement in the lower extremity symptoms. No interval shortening of the patient's claudication distance or rest pain symptoms. No new ulcers or wounds have occurred since the last visit.   There have been no significant changes to the patient's overall health care.  His largest complaint is of his neuropathy which has been bothering him for some years.   No documented history of amaurosis fugax or recent TIA symptoms. There are no recent neurological changes noted. No documented history of DVT, PE or superficial thrombophlebitis. The patient denies recent episodes of angina or shortness of breath.    ABI's Rt=0.92 and Lt=0.85  (previous ABI's Rt=0.84 and Lt=0.85) Duplex US  of the bilateral lower extremity arterial system biphasic tibial waveforms bilaterally.  Previous studies that show known SFA occlusions bilaterally.  The patient is also seen for follow up evaluation of carotid stenosis. The carotid stenosis followed by ultrasound.    The patient denies amaurosis fugax. There is no recent history of TIA symptoms or focal motor deficits. There is no prior documented CVA.   The patient is taking enteric-coated aspirin  81 mg daily.   There is no history of migraine headaches. There is no history of seizures.   The patient has a history of coronary artery disease, no recent episodes of angina or shortness of breath. The patient denies PAD or claudication symptoms. There is a history of hyperlipidemia which is being  treated with a statin.     Carotid Duplex done today shows widely patent right CEA with <40% and Left Carotid Artery with <40% Stenosis      Review of Systems     Objective:   Physical Exam  BP (!) 155/80 (BP Location: Left Arm, Patient Position: Sitting, Cuff Size: Normal)   Pulse 64   Resp 18   Ht 5' 8 (1.727 m)   Wt 209 lb 6.4 oz (95 kg)   BMI 31.84 kg/m   Past Medical History:  Diagnosis Date   Adenoma of colon    Arthritis    Bilateral carotid artery stenosis    Chronic kidney disease    Diverticulosis    GERD (gastroesophageal reflux disease)    Hiatal hernia    History of gout    History of gout    History of kidney stones    History of TIA (transient ischemic attack)    Hyperlipemia    Hypertension    Paroxysmal SVT (supraventricular tachycardia)    Peripheral vascular disease    Primary thrombocytopenia (HCC)    Psoriasis    TIA (transient ischemic attack)    Type 2 diabetes mellitus without complication    Umbilical hernia     Social History   Socioeconomic History   Marital status: Widowed    Spouse name: Not on file   Number of children: Not on file   Years of education: Not on file   Highest education level: Not on file  Occupational History   Not on file  Tobacco Use   Smoking  status: Former    Current packs/day: 0.00    Types: Cigarettes    Quit date: 02/03/1989    Years since quitting: 35.6   Smokeless tobacco: Never  Vaping Use   Vaping status: Never Used  Substance and Sexual Activity   Alcohol use: Yes    Alcohol/week: 6.0 standard drinks of alcohol    Types: 6 Cans of beer per week   Drug use: No   Sexual activity: Not on file  Other Topics Concern   Not on file  Social History Narrative   Lives alone   Social Drivers of Health   Financial Resource Strain: Not on file  Food Insecurity: No Food Insecurity (03/20/2023)   Hunger Vital Sign    Worried About Running Out of Food in the Last Year: Never true    Ran Out of  Food in the Last Year: Never true  Transportation Needs: No Transportation Needs (03/20/2023)   PRAPARE - Administrator, Civil Service (Medical): No    Lack of Transportation (Non-Medical): No  Physical Activity: Not on file  Stress: Not on file  Social Connections: Not on file  Intimate Partner Violence: Not At Risk (03/20/2023)   Humiliation, Afraid, Rape, and Kick questionnaire    Fear of Current or Ex-Partner: No    Emotionally Abused: No    Physically Abused: No    Sexually Abused: No    Past Surgical History:  Procedure Laterality Date   BILIARY DILATION  01/17/2023   Procedure: BILIARY DILATION;  Surgeon: Charlanne Groom, MD;  Location: [facility] ENDOSCOPY;  Service: Gastroenterology;;   CATARACT EXTRACTION, BILATERAL Bilateral    CHOLECYSTECTOMY     COLONOSCOPY     COLONOSCOPY N/A 07/14/2024   Procedure: COLONOSCOPY;  Surgeon: Maryruth Ole DASEN, MD;  Location: [facility] ENDOSCOPY;  Service: Endoscopy;  Laterality: N/A;   COLONOSCOPY WITH PROPOFOL  N/A 06/18/2016   Procedure: COLONOSCOPY WITH PROPOFOL ;  Surgeon: Gladis RAYMOND Mariner, MD;  Location: [facility] ENDOSCOPY;  Service: Endoscopy;  Laterality: N/A;   COLONOSCOPY WITH PROPOFOL  N/A 01/09/2021   Procedure: COLONOSCOPY WITH PROPOFOL ;  Surgeon: Maryruth Ole DASEN, MD;  Location: [facility] ENDOSCOPY;  Service: Endoscopy;  Laterality: N/A;   ENDARTERECTOMY Right 12/19/2018   Procedure: ENDARTERECTOMY CAROTID;  Surgeon: Jama Cordella MATSU, MD;  Location: [facility] ORS;  Service: Vascular;  Laterality: Right;   ENDARTERECTOMY FEMORAL Bilateral 03/20/2023   Procedure: ENDARTERECTOMY FEMORAL  ( SFA STENT );  Surgeon: Jama Cordella MATSU, MD;  Location: [facility] ORS;  Service: Vascular;  Laterality: Bilateral;   ENDOSCOPIC RETROGRADE CHOLANGIOPANCREATOGRAPHY (ERCP) WITH PROPOFOL  N/A 01/17/2023   Procedure: ENDOSCOPIC RETROGRADE CHOLANGIOPANCREATOGRAPHY (ERCP) WITH PROPOFOL ;  Surgeon: Charlanne Groom, MD;  Location: [facility] ENDOSCOPY;  Service: Gastroenterology;   Laterality: N/A;   ESOPHAGOGASTRODUODENOSCOPY (EGD) WITH PROPOFOL  N/A 06/18/2016   Procedure: ESOPHAGOGASTRODUODENOSCOPY (EGD) WITH PROPOFOL ;  Surgeon: Gladis RAYMOND Mariner, MD;  Location: [facility] ENDOSCOPY;  Service: Endoscopy;  Laterality: N/A;   ESOPHAGOGASTRODUODENOSCOPY (EGD) WITH PROPOFOL  N/A 01/09/2021   Procedure: ESOPHAGOGASTRODUODENOSCOPY (EGD) WITH PROPOFOL ;  Surgeon: Maryruth Ole DASEN, MD;  Location: [facility] ENDOSCOPY;  Service: Endoscopy;  Laterality: N/A;   HERNIA REPAIR     LOWER EXTREMITY ANGIOGRAPHY Right 01/25/2023   Procedure: Lower Extremity Angiography;  Surgeon: Jama Cordella MATSU, MD;  Location: [facility] INVASIVE CV LAB;  Service: Cardiovascular;  Laterality: Right;   POLYPECTOMY  07/14/2024   Procedure: POLYPECTOMY, INTESTINE;  Surgeon: Maryruth Ole DASEN, MD;  Location: [facility] ENDOSCOPY;  Service: Endoscopy;;   REMOVAL OF STONES  01/17/2023   Procedure: REMOVAL  OF STONES;  Surgeon: Charlanne Groom, MD;  Location: [facility] ENDOSCOPY;  Service: Gastroenterology;;   ROBOTIC ASSISTED LAPAROSCOPIC CHOLECYSTECTOMY  03/02/2022   SPHINCTEROTOMY  01/17/2023   Procedure: ANNETT;  Surgeon: Charlanne Groom, MD;  Location: [facility] ENDOSCOPY;  Service: Gastroenterology;;   TONSILLECTOMY     UMBILICAL HERNIA REPAIR      Family History  Problem Relation Age of Onset   Cataracts Mother    Heart attack Mother    Cancer Sister     No Known Allergies     Latest Ref Rng & Units 03/21/2023   12:18 AM 03/20/2023    4:06 PM 03/18/2023    1:39 PM  CBC  WBC 4.0 - 10.5 K/uL 10.4  10.5  5.2   Hemoglobin 13.0 - 17.0 g/dL 9.7  89.2  87.5   Hematocrit 39.0 - 52.0 % 29.5  33.1  37.0   Platelets 150 - 400 K/uL 120  125  137       CMP     Component Value Date/Time   NA 139 03/21/2023 0018   K 5.0 03/21/2023 0018   CL 110 03/21/2023 0018   CO2 21 (L) 03/21/2023 0018   GLUCOSE 182 (H) 03/21/2023 0018   BUN 29 (H) 03/21/2023 0018   CREATININE 1.11 03/21/2023 0018   CALCIUM  7.7 (L) 03/21/2023 0018    PROT 6.3 (L) 01/21/2023 0549   ALBUMIN  2.8 (L) 01/21/2023 0549   AST 96 (H) 01/21/2023 0549   ALT 270 (H) 01/21/2023 0549   ALKPHOS 236 (H) 01/21/2023 0549   BILITOT 2.4 (H) 01/21/2023 0549   GFRNONAA >60 03/21/2023 0018     VAS US  ABI WITH/WO TBI Result Date: 09/04/2024  LOWER EXTREMITY DOPPLER STUDY Patient Name:  KALYN DIMATTIA  Date of Exam:   08/31/2024 Medical Rec #: [id number]         Accession #:    [id number] Date of Birth: Jan 16, 1946         Patient Gender: M Patient Age:   31 years Exam Location:  [facility] Procedure:      VAS US  ABI WITH/WO TBI Referring Phys: Cordella Shawl --------------------------------------------------------------------------------  Indications: Peripheral artery disease.  Vascular Interventions: 4//09/2023 Left common femoral, profunda femoris, and                         superficial femoral artery endarterectomies                         2. Right common femoral, profunda femoris, and                         superficial femoral artery endarterectomies                          01/25/2023 Percutaneous transluminal angioplasty and                         stent placement right common iliac artery; kissing                         balloon technique                         4. Percutaneous transluminal and plasty and stent  placement left common iliac artery; kissing balloon                         technique                         5. Ultrasound guided access bilateral common femoral                         arteries                         6. StarClose closure device bilateral common femoral                         arteries. Comparison Study: 03/02/2024 Performing Technologist: Leafy Gibes RVS  Examination Guidelines: A complete evaluation includes at minimum, Doppler waveform signals and systolic blood pressure reading at the level of bilateral brachial, anterior tibial, and posterior tibial arteries, when vessel segments are  accessible. Bilateral testing is considered an integral part of a complete examination. Photoelectric Plethysmograph (PPG) waveforms and toe systolic pressure readings are included as required and additional duplex testing as needed. Limited examinations for reoccurring indications may be performed as noted.  ABI Findings: +---------+------------------+-----+--------+--------+ Right    Rt Pressure (mmHg)IndexWaveformComment  +---------+------------------+-----+--------+--------+ Brachial 147                                     +---------+------------------+-----+--------+--------+ ATA      130               0.88 biphasic         +---------+------------------+-----+--------+--------+ PTA      135               0.92 biphasic         +---------+------------------+-----+--------+--------+ PERO     114               0.78 biphasic         +---------+------------------+-----+--------+--------+ Great Toe136               0.93 Normal           +---------+------------------+-----+--------+--------+ +---------+------------------+-----+--------+-------+ Left     Lt Pressure (mmHg)IndexWaveformComment +---------+------------------+-----+--------+-------+ Brachial 145                                    +---------+------------------+-----+--------+-------+ ATA      122               0.83 biphasic        +---------+------------------+-----+--------+-------+ PTA      125               0.85 biphasic        +---------+------------------+-----+--------+-------+ PERO     108               0.73 biphasic        +---------+------------------+-----+--------+-------+ Great Toe140               0.95 Normal          +---------+------------------+-----+--------+-------+ +-------+-----------+-----------+------------+------------+ ABI/TBIToday's ABIToday's TBIPrevious ABIPrevious TBI +-------+-----------+-----------+------------+------------+ Right  .92        .93         .84         .  77          +-------+-----------+-----------+------------+------------+ Left   .85        .95        .85         .89          +-------+-----------+-----------+------------+------------+ Right ABIs and TBIs appear increased compared to prior study on 03/02/2024. Left ABIs appear essentially unchanged compared to prior study on 03/02/2024. Left TBIs appear to be increased compared to prior study on 03/02/2024.  Summary: Right: Resting right ankle-brachial index indicates mild right lower extremity arterial disease. The right toe-brachial index is normal.  Left: Resting left ankle-brachial index indicates mild left lower extremity arterial disease. The left toe-brachial index is normal.  *See table(s) above for measurements and observations.  Electronically signed by Cordella Shawl MD on 09/04/2024 at 7:16:18 AM.    Final        Assessment & Plan:   1. Bilateral carotid artery stenosis (Primary) Recommend:   Given the patient's asymptomatic subcritical stenosis no further invasive testing or surgery at this time.   Duplex ultrasound shows 1-39% stenosis bilaterally.   Continue antiplatelet therapy as prescribed Continue management of CAD, HTN and Hyperlipidemia Healthy heart diet,  encouraged exercise at least 4 times per week Follow up in 6 months with duplex ultrasound and physical exam.  2. Peripheral arterial disease with history of revascularization The patient returns to the office for followup regarding atherosclerotic changes of the lower extremities and review of the noninvasive studies.   There have been no interval changes in lower extremity symptoms. No interval shortening of the patient's claudication distance or development of rest pain symptoms. No new ulcers or wounds have occurred since the last visit.  There have been no significant changes to the patient's overall health care.  The patient denies amaurosis fugax or recent TIA symptoms. There are no  documented recent neurological changes noted. There is no history of DVT, PE or superficial thrombophlebitis. The patient denies recent episodes of angina or shortness of breath.   ABI Rt=0.92 and Lt=0.85  (previous ABI's Rt=0.84 and Lt=0.85) Duplex ultrasound of the 08/31/2024  3. Essential hypertension Continue antihypertensive medications as already ordered, these medications have been reviewed and there are no changes at this time.  4. Hyperlipidemia, mixed Continue statin as ordered and reviewed, no changes at this time   Current Outpatient Medications on File Prior to Visit  Medication Sig Dispense Refill   allopurinol  (ZYLOPRIM ) 300 MG tablet Take 300 mg by mouth daily.     amLODipine  (NORVASC ) 5 MG tablet Take 1 tablet (5 mg total) by mouth daily. 90 tablet 0   aspirin  EC 81 MG tablet Take 81 mg by mouth daily.      atorvastatin  (LIPITOR) 40 MG tablet Take 1 tablet (40 mg total) by mouth daily.     Cholecalciferol  (D3-1000) 25 MCG (1000 UT) tablet Take 1,000 Units by mouth daily.     clopidogrel  (PLAVIX ) 75 MG tablet Take 1 tablet (75 mg total) by mouth daily. 90 tablet 3   cyanocobalamin  (VITAMIN B12) 1000 MCG tablet Take 1,000 mcg by mouth daily.     indomethacin  (INDOCIN ) 25 MG capsule Take 25 mg by mouth 2 (two) times daily as needed.     leptospermum manuka honey (MEDIHONEY) PSTE paste Apply 1 Application topically daily. 44 mL 0   lisinopril -hydrochlorothiazide  (ZESTORETIC ) 20-12.5 MG tablet Take 1 tablet by mouth 2 (two) times daily. (Patient taking differently: Take 1 tablet by mouth daily.)  metFORMIN (GLUCOPHAGE) 500 MG tablet Take 500 mg by mouth daily with breakfast.     metoprolol  tartrate (LOPRESSOR ) 25 MG tablet Take 25 mg by mouth daily.     Multiple Vitamin (MULTI-VITAMINS) TABS Take 1 tablet by mouth daily.      pantoprazole  (PROTONIX ) 40 MG tablet Take 40 mg by mouth at bedtime.     vitamin C  (ASCORBIC ACID ) 500 MG tablet Take 500 mg by mouth daily.      vitamin E 180 MG (400 UNITS) capsule Take 400 Units by mouth daily.     sulfamethoxazole -trimethoprim  (BACTRIM  DS) 800-160 MG tablet Take 1 tablet by mouth 2 (two) times daily. (Patient not taking: Reported on 08/31/2024) 20 tablet 0   No current facility-administered medications on file prior to visit.    There are no Patient Instructions on file for this visit. No follow-ups on file.   Gwendlyn JONELLE Shank, NP

## 2024-09-22 NOTE — Progress Notes (Signed)
 Patient left before being seen.

## 2024-09-27 ENCOUNTER — Other Ambulatory Visit (INDEPENDENT_AMBULATORY_CARE_PROVIDER_SITE_OTHER): Payer: Self-pay | Admitting: Nurse Practitioner

## 2024-11-27 ENCOUNTER — Emergency Department
Admission: EM | Admit: 2024-11-27 | Discharge: 2024-11-27 | Disposition: A | Discharge status: Home / Self Care | Source: Ambulatory Visit | Attending: Emergency Medicine | Admitting: Emergency Medicine

## 2024-11-27 ENCOUNTER — Emergency Department

## 2024-11-27 ENCOUNTER — Other Ambulatory Visit: Payer: Self-pay

## 2024-11-27 DIAGNOSIS — W1839XA Other fall on same level, initial encounter: Secondary | ICD-10-CM | POA: Diagnosis not present

## 2024-11-27 DIAGNOSIS — M7989 Other specified soft tissue disorders: Secondary | ICD-10-CM | POA: Diagnosis not present

## 2024-11-27 DIAGNOSIS — M25562 Pain in left knee: Secondary | ICD-10-CM | POA: Diagnosis present

## 2024-11-27 NOTE — ED Triage Notes (Signed)
 Patient c/o left knee pain/swelling after fall 3 weeks ago. Patient also c/o left foot pain/swelling post fall.

## 2024-11-27 NOTE — Discharge Instructions (Addendum)
 Your evaluated in the ED for left knee pain.  Your x-rays are normal and ultrasound is negative for blood clot.  Please follow-up with your primary care provider for further evaluation and management.

## 2024-11-27 NOTE — ED Triage Notes (Signed)
 First nurse note: pt to ED [facility] for left knee injury a few weeks ago, reports feels like swelling. +left calf pain, increased with ambulation.

## 2024-11-27 NOTE — ED Provider Notes (Signed)
 "   [facility] Emergency Department Provider Note     Event Date/Time   First MD Initiated Contact with Patient 11/27/24 1803     (approximate)   History   Knee Injury   HPI  Victor Rojas is a 78 y.o. male presents to the ED with left leg swelling and left knee pain.  Patient reports he fell directly onto his left knee 3 weeks ago.  Since his fall he has noticed increased swelling to the lower extremity with calf pain.  Pain is worse with ambulation.  Denies tobacco use, recent surgeries, recent travel or history of DVT.  Physical Exam   Triage Vital Signs: ED Triage Vitals  Encounter Vitals Group     BP 11/27/24 1532 (!) 182/76     Girls Systolic BP Percentile --      Girls Diastolic BP Percentile --      Boys Systolic BP Percentile --      Boys Diastolic BP Percentile --      Pulse Rate 11/27/24 1532 72     Resp 11/27/24 1532 17     Temp 11/27/24 1532 98.2 F (36.8 C)     Temp Source 11/27/24 1532 Oral     SpO2 11/27/24 1532 95 %     Weight 11/27/24 1533 213 lb (96.6 kg)     Height 11/27/24 1533 5' 8 (1.727 m)     Head Circumference --      Peak Flow --      Pain Score 11/27/24 1533 3     Pain Loc --      Pain Education --      Exclude from Growth Chart --     Most recent vital signs: Vitals:   11/27/24 1532  BP: (!) 182/76  Pulse: 72  Resp: 17  Temp: 98.2 F (36.8 C)  SpO2: 95%    General Awake, no distress.  HEENT NCAT.  CV:  Good peripheral perfusion.  RESP:  Normal effort.  ABD:  No distention.  Other:  No visible deformity to the left knee.  No redness or warmth to palpation.  There is notable pitting edema to left lower extremity.  No redness or warmth.  Pedal pulses heard on Doppler.   ED Results / Procedures / Treatments   Labs (all labs ordered are listed, but only abnormal results are displayed) Labs Reviewed - No data to display  RADIOLOGY  I personally viewed and evaluated these images as part of my  medical decision making, as well as reviewing the written report by the radiologist.  ED Provider Interpretation: Normal left lower extremity ultrasound  No acute abnormality to left knee or left with x-ray.  US  Venous Img Lower  Left (DVT Study) Result Date: 11/27/2024 CLINICAL DATA:  Leg swelling EXAM: Left LOWER EXTREMITY VENOUS DOPPLER ULTRASOUND TECHNIQUE: Gray-scale sonography with compression, as well as color and duplex ultrasound, were performed to evaluate the deep venous system(s) from the level of the common femoral vein through the popliteal and proximal calf veins. COMPARISON:  None Available. FINDINGS: VENOUS Normal compressibility of the common femoral, superficial femoral, and popliteal veins, as well as the visualized calf veins. Visualized portions of profunda femoral vein and great saphenous vein unremarkable. No filling defects to suggest DVT on grayscale or color Doppler imaging. Doppler waveforms show normal direction of venous flow, normal respiratory plasticity and response to augmentation. Limited views of the contralateral common femoral vein are unremarkable. OTHER None. Limitations: none  IMPRESSION: Negative. Electronically Signed   By: Luke Bun M.D.   On: 11/27/2024 19:49   DG Knee Left Port Result Date: 11/27/2024 EXAM: 1 VIEW(S) XRAY OF THE LEFT KNEE 11/27/2024 04:06:00 PM COMPARISON: None available. CLINICAL HISTORY: [id number] Fall [id number] FINDINGS: BONES AND JOINTS: No acute fracture. No malalignment. No significant joint effusion. 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 SOFT TISSUES: Vascular calcifications. IMPRESSION: 1. No acute findings. 2. 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 Electronically signed by: Rockey Kilts MD 11/27/2024 04:52 PM EST RP Workstation: [id number]   DG Foot Complete Left Result Date: 11/27/2024 EXAM: 3 OR MORE VIEW(S) XRAY OF THE LEFT FOOT 11/27/2024 04:06:00 PM  COMPARISON: None available. CLINICAL HISTORY: Fall FINDINGS: BONES AND JOINTS: No acute fracture. No malalignment. Achilles spurs. Mild hallux valgus deformity with minimal degenerative changes of the first metatarsophalangeal joint. SOFT TISSUES: Soft tissue calcifications medial to the first metatarsophalangeal joint. Dorsal soft tissue swelling about the forefoot is mild . Vascular calcifications. IMPRESSION: 1. No acute fracture or dislocation. Electronically signed by: Rockey Kilts MD 11/27/2024 04:50 PM EST RP Workstation: [id number]   PROCEDURES:  Critical Care performed: No  Procedures  MEDICATIONS ORDERED IN ED: Medications - No data to display  IMPRESSION / MDM / ASSESSMENT AND PLAN / ED COURSE  I reviewed the triage vital signs and the nursing notes.                              Clinical Course as of 11/27/24 2315  Fri Nov 27, 2024  1839 3 weeks ago on left knee  [MH]  1957 DG Foot Complete Left IMPRESSION: 1. No acute fracture or dislocation.   [MH]  1957 DG Knee Left Port IMPRESSION: 1. No acute findings. 2. 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   [MH]    Clinical Course User Index [MH] Margrette Rebbeca LABOR, PA-C    78 y.o. male presents to the emergency department for evaluation and treatment of left lower extremity swelling. See HPI for further details.   Differential diagnosis includes, but is not limited to fracture, effusion, DVT, lymphedema  Patient's presentation is most consistent with acute complicated illness / injury requiring diagnostic workup.  Patient is alert and oriented.  He is hemodynamic stable.  Physical exam findings are stated above.  Reassuring x-ray and ultrasound ruling out fracture and DVT.  Advised compression socks and elevation.  He is encouraged to follow-up closely with his primary care provider for further evaluation.  ED return precautions were discussed with  patient who verbalized understanding.  Patient stable at discharge home  FINAL CLINICAL IMPRESSION(S) / ED DIAGNOSES   Final diagnoses:  Left knee pain, unspecified chronicity  Leg swelling   Rx / DC Orders   ED Discharge Orders     None       Note:  This document was prepared using Dragon voice recognition software and may include unintentional dictation errors.    Margrette, Jamilee Lafosse A, PA-C 11/27/24 2315    Dicky Anes, MD 12/07/24 2049  "

## 2025-03-15 ENCOUNTER — Ambulatory Visit (INDEPENDENT_AMBULATORY_CARE_PROVIDER_SITE_OTHER): Admitting: Vascular Surgery

## 2025-03-15 ENCOUNTER — Encounter (INDEPENDENT_AMBULATORY_CARE_PROVIDER_SITE_OTHER)
